# Patient Record
Sex: Female | Born: 1965
Health system: Southern US, Community
[De-identification: ages and names within clinical notes are randomized; demographics above are authoritative.]

## PROBLEM LIST (undated history)

## (undated) DIAGNOSIS — F329 Major depressive disorder, single episode, unspecified: Secondary | ICD-10-CM

## (undated) DIAGNOSIS — E039 Hypothyroidism, unspecified: Secondary | ICD-10-CM

## (undated) DIAGNOSIS — E079 Disorder of thyroid, unspecified: Secondary | ICD-10-CM

## (undated) DIAGNOSIS — F32A Depression, unspecified: Secondary | ICD-10-CM

## (undated) HISTORY — DX: Hypothyroidism, unspecified: E03.9

## (undated) HISTORY — PX: FRACTURE SURGERY: SHX138

## (undated) HISTORY — DX: Major depressive disorder, single episode, unspecified: F32.9

## (undated) HISTORY — DX: Depression, unspecified: F32.A

## (undated) HISTORY — PX: BREAST CYST EXCISION: SHX579

## (undated) HISTORY — DX: Disorder of thyroid, unspecified: E07.9

---

## 1988-09-19 HISTORY — PX: TUBAL LIGATION: SHX77

## 2001-09-19 HISTORY — PX: SPINAL CORD STIMULATOR IMPLANT: SHX2422

## 2011-08-15 ENCOUNTER — Ambulatory Visit: Payer: Self-pay | Admitting: Pain Medicine

## 2011-08-17 ENCOUNTER — Ambulatory Visit: Payer: Self-pay | Admitting: Pain Medicine

## 2011-08-23 ENCOUNTER — Ambulatory Visit: Payer: Self-pay | Admitting: Pain Medicine

## 2011-09-05 ENCOUNTER — Ambulatory Visit: Payer: Self-pay | Admitting: Pain Medicine

## 2011-10-03 ENCOUNTER — Ambulatory Visit: Payer: Self-pay | Admitting: Pain Medicine

## 2011-11-30 ENCOUNTER — Ambulatory Visit: Payer: Self-pay | Admitting: Pain Medicine

## 2011-11-30 LAB — HEMOGLOBIN: HGB: 12.5 g/dL (ref 12.0–16.0)

## 2011-12-06 ENCOUNTER — Ambulatory Visit: Payer: Self-pay | Admitting: Pain Medicine

## 2011-12-06 LAB — PREGNANCY, URINE: Pregnancy Test, Urine: NEGATIVE m[IU]/mL

## 2011-12-15 ENCOUNTER — Ambulatory Visit: Payer: Self-pay | Admitting: Pain Medicine

## 2012-01-23 ENCOUNTER — Ambulatory Visit: Payer: Self-pay | Admitting: Pain Medicine

## 2012-03-01 ENCOUNTER — Ambulatory Visit: Payer: Self-pay | Admitting: Internal Medicine

## 2012-05-29 ENCOUNTER — Ambulatory Visit: Payer: Self-pay | Admitting: Pain Medicine

## 2012-09-24 ENCOUNTER — Ambulatory Visit: Payer: Self-pay | Admitting: Pain Medicine

## 2013-01-21 ENCOUNTER — Other Ambulatory Visit: Payer: Self-pay | Admitting: Pain Medicine

## 2013-01-21 ENCOUNTER — Ambulatory Visit: Payer: Self-pay | Admitting: Pain Medicine

## 2013-05-24 ENCOUNTER — Ambulatory Visit: Payer: Self-pay | Admitting: Pain Medicine

## 2013-07-23 ENCOUNTER — Ambulatory Visit: Payer: Self-pay | Admitting: Pain Medicine

## 2013-11-08 ENCOUNTER — Other Ambulatory Visit: Payer: Self-pay | Admitting: Pain Medicine

## 2013-11-08 ENCOUNTER — Ambulatory Visit: Payer: Self-pay | Admitting: Pain Medicine

## 2013-11-08 LAB — HEPATIC FUNCTION PANEL A (ARMC)
ALBUMIN: 4.1 g/dL (ref 3.4–5.0)
ALK PHOS: 48 U/L
ALT: 19 U/L (ref 12–78)
AST: 24 U/L (ref 15–37)
BILIRUBIN TOTAL: 0.4 mg/dL (ref 0.2–1.0)
Bilirubin, Direct: 0.1 mg/dL (ref 0.00–0.20)
Total Protein: 7.4 g/dL (ref 6.4–8.2)

## 2013-11-08 LAB — BASIC METABOLIC PANEL
Anion Gap: 4 — ABNORMAL LOW (ref 7–16)
BUN: 6 mg/dL — ABNORMAL LOW (ref 7–18)
CREATININE: 0.93 mg/dL (ref 0.60–1.30)
Calcium, Total: 9 mg/dL (ref 8.5–10.1)
Chloride: 104 mmol/L (ref 98–107)
Co2: 30 mmol/L (ref 21–32)
EGFR (Non-African Amer.): >60
Glucose: 86 mg/dL (ref 65–99)
OSMOLALITY: 273 (ref 275–301)
Potassium: 3.7 mmol/L (ref 3.5–5.1)
SODIUM: 138 mmol/L (ref 136–145)

## 2013-11-08 LAB — MAGNESIUM: Magnesium: 1.8 mg/dL

## 2014-02-17 ENCOUNTER — Ambulatory Visit: Payer: Self-pay | Admitting: Pain Medicine

## 2014-05-23 ENCOUNTER — Ambulatory Visit: Payer: Self-pay | Admitting: Pain Medicine

## 2014-09-05 ENCOUNTER — Ambulatory Visit: Payer: Self-pay | Admitting: Pain Medicine

## 2015-01-11 NOTE — Op Note (Signed)
PATIENT NAME:  Dorise HissFAVORITE, MELISSA B MR#:  [id number] DATE OF BIRTH:  1966-09-18  DATE OF PROCEDURE:  12/06/2011  ADDENDUM: Following is the information with regards to the implanted equipment for the patient.  1. Medtronic Restore Ultra Model [id number]#37712, Serial [id number].  2. Medtronic 1x4 pocket adapter Model [id number]#74001, Lot [id number]. 3. Medtronic patient programmer Model [id number], Serial [id number]. 4. Medtronic charging system Model [id number]#37754, Serial [id number]. ____________________________ Sydnee LevansFrancisco A. Laban EmperorNaveira, MD fan:slb D: 12/07/2011 08:11:00 ET T: 12/07/2011 11:10:11 ET JOB#: [id number]  cc: Shetara Launer A. Laban EmperorNaveira, MD, <Dictator> Oswaldo DoneFRANCISCO A Merelin Human MD ELECTRONICALLY SIGNED 12/07/2011 15:34

## 2015-01-11 NOTE — Op Note (Signed)
PATIENT NAME:  Wendy Snyder, FULLMAN MR#:  [id number] DATE OF BIRTH:  [birth year] ACCOUNT NUMBER: [id number]   DATE OF PROCEDURE:  12/06/2011  Location: Operating Room Referring Physician: Bari Edward, M.D.  fax [phone] Consulting Pain Physician/Surgeon: Para March A. Laban Emperor, M.D.  Note:  This is the case of a 49 year old white female patient with a history significant for a right upper extremity complex regional pain syndrome who had a cervical spinal cord stimulator implanted approximately 10 years ago for the management of the condition. The patient has done extremely well and after ten years the original battery has depleted and the patient is coming in today to have it replaced. Previously we interrogated the battery device and confirmed that it was completely depleted.   Procedure(s):  1. Neurostimulator Generator Replacement (Battery change). 2. Intraoperative Analysis and Programming. 3. Postoperative Analysis and Programming. 4. General anesthesia by [employer] [facility]  Surgeon: Sydnee Levans. Laban Emperor, M.D. Side of implant: Right side Diagnostic Indications: Right upper extremity complex regional pain syndrome.  End of battery life. Position: Supine. Prepping solution: DuraPrep Area prepped:  Right lower abdominal quadrant where the prior battery was implanted.  Infection Control:  Standard Universal Precautions taken (Respiratory Hygiene/Cough Etiquette; Mouth, nose, eye protection; Hand Hygiene; Personal protective equipment (PPE); safe injection practices; and use of masks and disposable sterile surgical gloves) as recommended by the Department of [employer] Center for Disease Control and Prevention (CDC).  Safety Measures:  Allergies were reviewed. Appropriate site, procedure, and patient were confirmed by following the Joint Commission's Universal Protocol (UP.01.01.01). The patient was asked to confirm marked site and procedure, before commencing. The patient was  asked about blood thinners, or active infections, both of which were denied. No attempt was made at seeking any paresthesias. Aspiration looking for blood return was conducted prior to injecting. At no point did we inject any substances, as a needle was being advanced.  Pre-procedure Assessment:  A medical history and physical exam were obtained. Relevant documentation was reviewed and verified. Prior to the procedure, the patient was provided with an Audio CD, as well as written information on the procedure, including side-effects, and possible complications. Under the influence of no sedatives, a verbal, as well as a written informed consent were obtained, after having provided information on the risks and possible complications. To fulfill our ethical and legal obligations, as recommended by the American Medical Association's Code of Ethics, we have provided information to the patient about our clinical impression; the nature and purpose of an available treatment or procedure; the risks and benefits of an available treatment or procedure; alternatives; the risk and benefits of the alternative treatment or procedure; and the risks and benefits of not receiving or undergoing a treatment or procedure. The patient was provided information about the risks and possible complications associated with the procedure. These include, but not limited to, failure to achieve desired goals, infection, bleeding, organ or nerve damage, allergic reactions, paralysis, and death. In addition, the patient was informed that Medicine is not an exact science; therefore, there is also the possibility of unforeseen risks and possible complications that may result in a catastrophic outcome. The patient indicated having understood very clearly.  We have given the patient no guarantees and we have made no promises. Ample time was given to the patient to ask questions, all of which were answered, to the patient's satisfaction, before  proceeding. The patient understands that by signing our informed consent form, they understand and accept the risks and the  fact that it is impossible to predict all possible complications. Baseline vital signs were taken and the medical assessment was completed. Verification of the correct person, correct site (including marking of site), and correct procedure were performed and confirmed by the patient. Baseline vital signs were taken and the initial assessment was completed. Verification of the correct person, correct site (including marking of site), and correct procedure were performed and confirmed by the patient, in the form of a "Time Out".  Monitoring: The patient was monitored in the usual manner, using NIBPM, ECG, and pulse oximetry.  IV Access:  An IV access was obtained and secured.  Anesthesia:  General anesthesia plus local anesthesia for postoperative pain management.  Prophylactic Antibiotics: Cefazolin (1st generation cephalosporin) 1 gm IV 30 minutes prior to the procedure.   Local Anesthesia: Lidocaine 1%. The skin over the procedure site were infiltrated using a 3 ml Luer-Lok syringe with a 0.5 inch, 25-G needle. Deeper tissues were infiltrated using a 3.0 inch, 22-G spinal needle, under fluoroscopic guidance.  Fluoroscopy:  Fluoroscopy was not used for this particular case.  Description of the procedure:  Once the area had been prepped and draped it was then infiltrated using a combination of lidocaine and Sensorcaine. An incision was made and the prior generator was identified and removed by unscrewing the leads connected to the device. An adapter was then placed and connected to the new battery which was then placed inside of the established battery pocket. At this point an analysis of the device was conducted and we confirmed adequate connection with confirmed low impedance in all leads. The pocket was then cleaned with a combination of hydrogen peroxide and Betadine. The  pocket was then approximated using Vicryl and subsequently the skin closed with surgical staples. Betadine ointment was placed over the wound followed by a piece of Telfa. This was then covered with an OpSite. The patient was then given an abdominal binder. The patient tolerated the entire procedure well and after having emerged from general anesthesia the patient was transferred to the recovery room where further programming and analysis of the device was conducted.    EBL: 10 ml.  Complications: No heme; no paresthesias.  Disposition: Return to clinics in 10-11 days for removal of staples and postoperative evaluation.  Additional Comments/Plan: None.  Equipment used: see adendum.  Disclaimer: Medicine is not an Visual merchandiserexact science. The only guarantee in medicine is that nothing is guaranteed. It is important to note that the decision to proceed with this intervention was based on the information collected from the patient. The Data and conclusions were drawn from the patient's questionnaire, the interview, and the physical examination. Because the information was provided in large part by the patient, it cannot be guaranteed that it has not been purposely or unconsciously manipulated. Every effort has been made to obtain as much relevant data as possible for this evaluation. It is important to note that the conclusions that lead to this procedure are derived in large part from the available data. Always take into account that the treatment will also be dependent on availability of resources and existing treatment guidelines, considered by other Pain Management Practitioners as being common knowledge and practice, at this time. For Medico-Legal purposes, it is also important to point out that variations in procedural techniques and pharmacological choices are the acceptable norm. The indications, contraindications, technique, and results of the above procedure should only be interpreted and judged by a  Board-Certified Interventional Pain Specialist with extensive familiarity  and expertise in the same exact procedure and technique, doing otherwise would be inappropriate and unethical.   ____________________________ Sydnee Levans. Laban Emperor, MD fan:bjt D: 12/07/2011 08:07:00 ET T: 12/07/2011 11:11:52 ET JOB#: [id number]  cc: Broedy Osbourne A. Laban Emperor, MD, <Dictator> Bari Edward, MD Oswaldo Done MD ELECTRONICALLY SIGNED 12/07/2011 15:35

## 2015-03-03 ENCOUNTER — Other Ambulatory Visit: Payer: Self-pay | Admitting: Internal Medicine

## 2015-03-03 ENCOUNTER — Encounter: Payer: Self-pay | Admitting: Internal Medicine

## 2015-03-03 DIAGNOSIS — F064 Anxiety disorder due to known physiological condition: Secondary | ICD-10-CM | POA: Insufficient documentation

## 2015-03-03 DIAGNOSIS — E039 Hypothyroidism, unspecified: Secondary | ICD-10-CM | POA: Insufficient documentation

## 2015-03-03 DIAGNOSIS — B009 Herpesviral infection, unspecified: Secondary | ICD-10-CM | POA: Insufficient documentation

## 2015-03-03 DIAGNOSIS — G894 Chronic pain syndrome: Secondary | ICD-10-CM | POA: Insufficient documentation

## 2015-03-03 DIAGNOSIS — N6019 Diffuse cystic mastopathy of unspecified breast: Secondary | ICD-10-CM | POA: Insufficient documentation

## 2015-03-03 DIAGNOSIS — F324 Major depressive disorder, single episode, in partial remission: Secondary | ICD-10-CM | POA: Insufficient documentation

## 2015-03-03 DIAGNOSIS — F5101 Primary insomnia: Secondary | ICD-10-CM | POA: Insufficient documentation

## 2015-03-04 ENCOUNTER — Ambulatory Visit
Admission: EM | Admit: 2015-03-04 | Discharge: 2015-03-04 | Disposition: A | Discharge status: Home / Self Care | Payer: BLUE CROSS/BLUE SHIELD | Attending: Family Medicine | Admitting: Family Medicine

## 2015-03-04 DIAGNOSIS — J069 Acute upper respiratory infection, unspecified: Secondary | ICD-10-CM | POA: Insufficient documentation

## 2015-03-04 DIAGNOSIS — Z79899 Other long term (current) drug therapy: Secondary | ICD-10-CM | POA: Diagnosis not present

## 2015-03-04 DIAGNOSIS — J029 Acute pharyngitis, unspecified: Secondary | ICD-10-CM | POA: Diagnosis present

## 2015-03-04 DIAGNOSIS — L237 Allergic contact dermatitis due to plants, except food: Secondary | ICD-10-CM | POA: Insufficient documentation

## 2015-03-04 LAB — RAPID STREP SCREEN (MED CTR MEBANE ONLY): STREPTOCOCCUS, GROUP A SCREEN (DIRECT): NEGATIVE

## 2015-03-04 MED ORDER — TRIAMCINOLONE ACETONIDE 0.1 % EX CREA
1.0000 | TOPICAL_CREAM | Freq: Two times a day (BID) | CUTANEOUS | Status: DC
Start: 2015-03-04 — End: 2015-08-17

## 2015-03-04 MED ORDER — AZITHROMYCIN 250 MG PO TABS: ORAL_TABLET | ORAL | Status: DC

## 2015-03-04 NOTE — ED Notes (Signed)
Patient complains of sore throat. She states that symptoms started on Sunday. States it is painful to swallow. She denies known fevers.

## 2015-03-04 NOTE — ED Provider Notes (Signed)
CSN: [id number]     Arrival date & time 03/04/15  1537 History   First MD Initiated Contact with Patient 03/04/15 1622     Chief Complaint  Patient presents with  . Sore Throat   (Consider location/radiation/quality/duration/timing/severity/associated sxs/prior Treatment) HPI   49 year old female who presents with a sore throat and upper respiratory symptoms since Sunday 3 days ago. She states that she and her boyfriend were burning poison ivy and that evening began to have a sore throat. Monday she felt very fatigued and slept most of the day is able to return to work on Tuesday. She has not been running a fever but has had a slight cough which has been nonproductive although she feels as though she has something in the back of her throat that she should try to expectorate.  No past medical history on file. Past Surgical History  Procedure Laterality Date  . Spinal cord stimulator implant  2003  . Tubal ligation  1990   Family History  Problem Relation Age of Onset  . Depression Mother    History  Substance Use Topics  . Smoking status: Never Smoker   . Smokeless tobacco: Not on file  . Alcohol Use: No   OB History    No data available     Review of Systems  HENT: Positive for congestion, postnasal drip and sore throat.   Eyes: Negative for photophobia, pain, discharge, redness, itching and visual disturbance.  Respiratory: Positive for cough.   All other systems reviewed and are negative.   Allergies  Review of patient's allergies indicates no known allergies.  Home Medications   Prior to Admission medications   Medication Sig Start Date End Date Taking? Authorizing Provider  ALPRAZolam Prudy Feeler) 0.5 MG tablet Take 1 tablet by mouth 2 (two) times daily as needed. 08/31/14  Yes Historical Provider, MD  HYDROcodone-acetaminophen (NORCO) 10-325 MG per tablet Take 1 tablet by mouth 6 (six) times daily.   Yes Historical Provider, MD  levothyroxine (SYNTHROID, LEVOTHROID) 50  MCG tablet Take 50 mcg by mouth daily before breakfast.   Yes Historical Provider, MD  azithromycin (ZITHROMAX Z-PAK) 250 MG tablet As directed per package instructions 03/04/15   Lutricia Feil, PA-C  triamcinolone cream (KENALOG) 0.1 % Apply 1 application topically 2 (two) times daily. 03/04/15   Lutricia Feil, PA-C  zolpidem (AMBIEN) 5 MG tablet Take 1 tablet by mouth at bedtime. 03/19/14   Historical Provider, MD   BP 143/89 mmHg  Pulse 91  Temp(Src) 97.9 F (36.6 C) (Oral)  Resp 18  Ht 5\' 7"  (1.702 m)  Wt 135 lb (61.236 kg)  BMI 21.14 kg/m2  SpO2 100%  LMP 01/29/2015 Physical Exam  Constitutional: She is oriented to person, place, and time. She appears well-developed and well-nourished.  HENT:  Head: Normocephalic and atraumatic.  Eyes: EOM are normal. Pupils are equal, round, and reactive to light. Right eye exhibits no discharge. Left eye exhibits no discharge.  Neck: Neck supple.  Pulmonary/Chest: Effort normal and breath sounds normal. No stridor. No respiratory distress. She has no wheezes. She has no rales. She exhibits no tenderness.  Musculoskeletal: She exhibits no edema or tenderness.  Lymphadenopathy:    She has no cervical adenopathy.  Neurological: She is alert and oriented to person, place, and time. She has normal reflexes.  Skin: Skin is warm and dry.  Patient has several linear erythematous best vesicular type lesions on her arms from poison ivy  Psychiatric: She has a normal  mood and affect. Her behavior is normal. Judgment and thought content normal.    ED Course  Procedures (including critical care time) Labs Review Labs Reviewed  RAPID STREP SCREEN (NOT AT [facility])  CULTURE, GROUP A STREP ([facility] ONLY)    Imaging Review No results found.   MDM   1. URI (upper respiratory infection)   2. Poison ivy dermatitis    New Prescriptions   AZITHROMYCIN (ZITHROMAX Z-PAK) 250 MG TABLET    As directed per package instructions   TRIAMCINOLONE CREAM (KENALOG)  0.1 %    Apply 1 application topically 2 (two) times daily.   Plan: 1. Diagnosis reviewed with patient 2. rx as per orders; risks, benefits, potential side effects reviewed with patient 3. Recommend supportive treatment with Flonase. 4. F/u prn if symptoms worsen or don't improve Reviewed findings with the patient. I've told her that the strep will be cultured for subgroups of strep. He'll call in 48 hours for those findings. In addition I've given her some triamcinolone cream for the poison ivy on her arms. Very limited amount and should respond to a topical application. She is follow-up she will return to the care of Dr. Deatra Robinson, PA-C 03/04/15 1708

## 2015-03-04 NOTE — Discharge Instructions (Signed)
Upper Respiratory Infection, Adult An upper respiratory infection (URI) is also sometimes known as the common cold. The upper respiratory tract includes the nose, sinuses, throat, trachea, and bronchi. Bronchi are the airways leading to the lungs. Most people improve within 1 week, but symptoms can last up to 2 weeks. A residual cough may last even longer.  CAUSES Many different viruses can infect the tissues lining the upper respiratory tract. The tissues become irritated and inflamed and often become very moist. Mucus production is also common. A cold is contagious. You can easily spread the virus to others by oral contact. This includes kissing, sharing a glass, coughing, or sneezing. Touching your mouth or nose and then touching a surface, which is then touched by another person, can also spread the virus. SYMPTOMS  Symptoms typically develop 1 to 3 days after you come in contact with a cold virus. Symptoms vary from person to person. They may include:  Runny nose.  Sneezing.  Nasal congestion.  Sinus irritation.  Sore throat.  Loss of voice (laryngitis).  Cough.  Fatigue.  Muscle aches.  Loss of appetite.  Headache.  Low-grade fever. DIAGNOSIS  You might diagnose your own cold based on familiar symptoms, since most people get a cold 2 to 3 times a year. Your caregiver can confirm this based on your exam. Most importantly, your caregiver can check that your symptoms are not due to another disease such as strep throat, sinusitis, pneumonia, asthma, or epiglottitis. Blood tests, throat tests, and X-rays are not necessary to diagnose a common cold, but they may sometimes be helpful in excluding other more serious diseases. Your caregiver will decide if any further tests are required. RISKS AND COMPLICATIONS  You may be at risk for a more severe case of the common cold if you smoke cigarettes, have chronic heart disease (such as heart failure) or lung disease (such as asthma), or if  you have a weakened immune system. The very young and very old are also at risk for more serious infections. Bacterial sinusitis, middle ear infections, and bacterial pneumonia can complicate the common cold. The common cold can worsen asthma and chronic obstructive pulmonary disease (COPD). Sometimes, these complications can require emergency medical care and may be life-threatening. PREVENTION  The best way to protect against getting a cold is to practice good hygiene. Avoid oral or hand contact with people with cold symptoms. Wash your hands often if contact occurs. There is no clear evidence that vitamin C, vitamin E, echinacea, or exercise reduces the chance of developing a cold. However, it is always recommended to get plenty of rest and practice good nutrition. TREATMENT  Treatment is directed at relieving symptoms. There is no cure. Antibiotics are not effective, because the infection is caused by a virus, not by bacteria. Treatment may include:  Increased fluid intake. Sports drinks offer valuable electrolytes, sugars, and fluids.  Breathing heated mist or steam (vaporizer or shower).  Eating chicken soup or other clear broths, and maintaining good nutrition.  Getting plenty of rest.  Using gargles or lozenges for comfort.  Controlling fevers with ibuprofen or acetaminophen as directed by your caregiver.  Increasing usage of your inhaler if you have asthma. Zinc gel and zinc lozenges, taken in the first 24 hours of the common cold, can shorten the duration and lessen the severity of symptoms. Pain medicines may help with fever, muscle aches, and throat pain. A variety of non-prescription medicines are available to treat congestion and runny nose. Your caregiver   can make recommendations and may suggest nasal or lung inhalers for other symptoms.  HOME CARE INSTRUCTIONS   Only take over-the-counter or prescription medicines for pain, discomfort, or fever as directed by your  caregiver.  Use a warm mist humidifier or inhale steam from a shower to increase air moisture. This may keep secretions moist and make it easier to breathe.  Drink enough water and fluids to keep your urine clear or pale yellow.  Rest as needed.  Return to work when your temperature has returned to normal or as your caregiver advises. You may need to stay home longer to avoid infecting others. You can also use a face mask and careful hand washing to prevent spread of the virus. SEEK MEDICAL CARE IF:   After the first few days, you feel you are getting worse rather than better.  You need your caregiver's advice about medicines to control symptoms.  You develop chills, worsening shortness of breath, or brown or red sputum. These may be signs of pneumonia.  You develop yellow or brown nasal discharge or pain in the face, especially when you bend forward. These may be signs of sinusitis.  You develop a fever, swollen neck glands, pain with swallowing, or white areas in the back of your throat. These may be signs of strep throat. SEEK IMMEDIATE MEDICAL CARE IF:   You have a fever.  You develop severe or persistent headache, ear pain, sinus pain, or chest pain.  You develop wheezing, a prolonged cough, cough up blood, or have a change in your usual mucus (if you have chronic lung disease).  You develop sore muscles or a stiff neck. Document Released: 03/01/2001 Document Revised: 11/28/2011 Document Reviewed: 12/11/2013 Exit[employer] Patient Information 2015 [employer]. This information is not intended to replace advice given to you by your health care provider. Make sure you discuss any questions you have with your health care provider.  

## 2015-03-06 ENCOUNTER — Encounter: Payer: Self-pay | Admitting: Internal Medicine

## 2015-03-06 ENCOUNTER — Ambulatory Visit (INDEPENDENT_AMBULATORY_CARE_PROVIDER_SITE_OTHER): Payer: BLUE CROSS/BLUE SHIELD | Admitting: Internal Medicine

## 2015-03-06 ENCOUNTER — Other Ambulatory Visit: Payer: Self-pay | Admitting: Internal Medicine

## 2015-03-06 VITALS — BP 110/80 | HR 82 | Temp 98.2°F | Ht 67.0 in | Wt 140.2 lb

## 2015-03-06 DIAGNOSIS — J40 Bronchitis, not specified as acute or chronic: Secondary | ICD-10-CM

## 2015-03-06 MED ORDER — HYDROCODONE-HOMATROPINE 5-1.5 MG/5ML PO SYRP
5.0000 mL | ORAL_SOLUTION | Freq: Four times a day (QID) | ORAL | Status: DC | PRN
Start: 2015-03-06 — End: 2015-08-17

## 2015-03-06 MED ORDER — CEFDINIR 300 MG PO CAPS: 300.0000 mg | ORAL_CAPSULE | Freq: Two times a day (BID) | ORAL | Status: DC

## 2015-03-06 NOTE — Progress Notes (Signed)
Date:  03/06/2015   Name:  Wendy Snyder   DOB:  May 01, 1966   MRN:  [id number]   Chief Complaint: Sore Throat Sore Throat  This is a new problem. The current episode started in the past 7 days. The problem has been unchanged. There has been no fever. Associated symptoms include congestion, coughing, a hoarse voice and swollen glands. Pertinent negatives include no ear discharge, ear pain, headaches, shortness of breath or vomiting. She has had no exposure to strep or mono. Treatments tried: Seen in UC 2 days ago.  Strep and strep culture were negative.  Zpak prescribed. The treatment provided mild (using Nyquil.) relief.     Review of Systems:  Review of Systems  Constitutional: Negative for fever and fatigue.  HENT: Positive for congestion, hoarse voice, sore throat and voice change. Negative for ear discharge, ear pain, mouth sores and tinnitus.   Respiratory: Positive for cough and chest tightness. Negative for shortness of breath.   Cardiovascular: Negative for chest pain.  Gastrointestinal: Negative for vomiting.  Neurological: Negative for headaches.    Patient Active Problem List   Diagnosis Date Noted  . 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 Clinical depression 03/03/2015  . Chronic pain syndrome 03/03/2015  . Bloodgood disease 03/03/2015  . Idiopathic insomnia 03/03/2015    Prior to Admission medications   Medication Sig Start Date End Date Taking? Authorizing Provider  ALPRAZolam Prudy Feeler) 0.5 MG tablet Take 1 tablet by mouth 2 (two) times daily as needed. 08/31/14  Yes Historical Provider, MD  azithromycin (ZITHROMAX Z-PAK) 250 MG tablet As directed per package instructions 03/04/15  Yes Lutricia Feil, PA-C  HYDROcodone-acetaminophen (NORCO) 10-325 MG per tablet Take 1 tablet by mouth 6 (six) times daily.   Yes Historical Provider, MD  levothyroxine  (SYNTHROID, LEVOTHROID) 50 MCG tablet Take 50 mcg by mouth daily before breakfast.   Yes Historical Provider, MD  triamcinolone cream (KENALOG) 0.1 % Apply 1 application topically 2 (two) times daily. 03/04/15  Yes Lutricia Feil, PA-C  venlafaxine XR (EFFEXOR-XR) 75 MG 24 hr capsule Take 1 capsule by mouth daily. 02/06/15  Yes Historical Provider, MD  zolpidem (AMBIEN) 5 MG tablet Take 1 tablet by mouth at bedtime. 03/19/14   Historical Provider, MD    No Known Allergies  Past Surgical History  Procedure Laterality Date  . Spinal cord stimulator implant  2003  . Tubal ligation  1990    History  Substance Use Topics  . Smoking status: Never Smoker   . Smokeless tobacco: Not on file  . Alcohol Use: No     Medication list has been reviewed and updated.  Physical Examination:  Physical Exam  Constitutional: She appears well-developed and well-nourished.  HENT:  Right Ear: External ear normal.  Left Ear: External ear normal.  Mouth/Throat: No oropharyngeal exudate.  Neck: Normal range of motion. Neck supple.  Cardiovascular: Normal rate, regular rhythm and normal heart sounds.   Pulmonary/Chest: Effort normal. No respiratory distress. She has no wheezes. She has rhonchi in the right upper field and the left upper field.  Lymphadenopathy:    She has no cervical adenopathy.  Psychiatric: She has a normal mood and affect.    BP 110/80 mmHg  Pulse 82  Temp(Src) 98.2 F (36.8 C)  Ht 5\' 7"  (1.702 m)  Wt 140 lb 3.2 oz (63.594 kg)  BMI 21.95 kg/m2  SpO2 98%  LMP  01/29/2015  1. Bronchitis Increase fluid intake Begin Mucinex bid Stop Zpak - cefdinir (OMNICEF) 300 MG capsule; Take 1 capsule (300 mg total) by mouth 2 (two) times daily.  Dispense: 20 capsule; Refill: 0 - HYDROcodone-homatropine (HYCODAN) 5-1.5 MG/5ML syrup; Take 5 mLs by mouth every 6 (six) hours as needed for cough.  Dispense: 120 mL; Refill: 0   Bari Edward, MD [facility]  03/06/2015

## 2015-03-06 NOTE — Patient Instructions (Signed)
Take Mucinex (plain) twice a day for 5-7 days

## 2015-03-07 LAB — CULTURE, GROUP A STREP (THRC)

## 2015-03-20 ENCOUNTER — Other Ambulatory Visit: Payer: Self-pay | Admitting: Internal Medicine

## 2015-05-02 ENCOUNTER — Other Ambulatory Visit: Payer: Self-pay | Admitting: Internal Medicine

## 2015-05-15 ENCOUNTER — Other Ambulatory Visit: Payer: Self-pay | Admitting: Internal Medicine

## 2015-06-30 ENCOUNTER — Other Ambulatory Visit: Payer: Self-pay

## 2015-06-30 MED ORDER — ACYCLOVIR 5 % EX OINT: 1.0000 "application " | TOPICAL_OINTMENT | CUTANEOUS | Status: DC

## 2015-06-30 MED ORDER — VALACYCLOVIR HCL 1 G PO TABS: 1000.0000 mg | ORAL_TABLET | Freq: Two times a day (BID) | ORAL | Status: DC

## 2015-07-16 ENCOUNTER — Ambulatory Visit: Payer: BLUE CROSS/BLUE SHIELD | Admitting: Pain Medicine

## 2015-07-16 ENCOUNTER — Other Ambulatory Visit: Payer: Self-pay | Admitting: Internal Medicine

## 2015-08-17 ENCOUNTER — Ambulatory Visit: Payer: BLUE CROSS/BLUE SHIELD | Attending: Pain Medicine | Admitting: Pain Medicine

## 2015-08-17 ENCOUNTER — Other Ambulatory Visit: Payer: Self-pay | Admitting: Pain Medicine

## 2015-08-17 VITALS — BP 143/88 | HR 68 | Temp 98.3°F | Resp 18 | Ht 67.0 in | Wt 140.0 lb

## 2015-08-17 DIAGNOSIS — Z9689 Presence of other specified functional implants: Secondary | ICD-10-CM

## 2015-08-17 DIAGNOSIS — M549 Dorsalgia, unspecified: Secondary | ICD-10-CM | POA: Insufficient documentation

## 2015-08-17 DIAGNOSIS — Z79899 Other long term (current) drug therapy: Secondary | ICD-10-CM

## 2015-08-17 DIAGNOSIS — G894 Chronic pain syndrome: Secondary | ICD-10-CM | POA: Diagnosis not present

## 2015-08-17 DIAGNOSIS — M79601 Pain in right arm: Secondary | ICD-10-CM

## 2015-08-17 DIAGNOSIS — Z79891 Long term (current) use of opiate analgesic: Secondary | ICD-10-CM | POA: Insufficient documentation

## 2015-08-17 DIAGNOSIS — F1190 Opioid use, unspecified, uncomplicated: Secondary | ICD-10-CM | POA: Diagnosis not present

## 2015-08-17 DIAGNOSIS — Z5181 Encounter for therapeutic drug level monitoring: Secondary | ICD-10-CM | POA: Insufficient documentation

## 2015-08-17 DIAGNOSIS — Z9149 Other personal history of psychological trauma, not elsewhere classified: Secondary | ICD-10-CM

## 2015-08-17 DIAGNOSIS — Z969 Presence of functional implant, unspecified: Secondary | ICD-10-CM

## 2015-08-17 DIAGNOSIS — G9050 Complex regional pain syndrome I, unspecified: Secondary | ICD-10-CM

## 2015-08-17 DIAGNOSIS — F1120 Opioid dependence, uncomplicated: Secondary | ICD-10-CM | POA: Insufficient documentation

## 2015-08-17 DIAGNOSIS — G90511 Complex regional pain syndrome I of right upper limb: Secondary | ICD-10-CM | POA: Diagnosis not present

## 2015-08-17 DIAGNOSIS — F419 Anxiety disorder, unspecified: Secondary | ICD-10-CM | POA: Diagnosis not present

## 2015-08-17 DIAGNOSIS — Z7189 Other specified counseling: Secondary | ICD-10-CM

## 2015-08-17 DIAGNOSIS — G8929 Other chronic pain: Secondary | ICD-10-CM

## 2015-08-17 DIAGNOSIS — Z8659 Personal history of other mental and behavioral disorders: Secondary | ICD-10-CM | POA: Insufficient documentation

## 2015-08-17 DIAGNOSIS — IMO0002 Reserved for concepts with insufficient information to code with codable children: Secondary | ICD-10-CM

## 2015-08-17 DIAGNOSIS — G90519 Complex regional pain syndrome I of unspecified upper limb: Secondary | ICD-10-CM | POA: Insufficient documentation

## 2015-08-17 MED ORDER — HYDROCODONE-ACETAMINOPHEN 10-325 MG PO TABS: 1.0000 | ORAL_TABLET | ORAL | Status: DC | PRN

## 2015-08-17 NOTE — Progress Notes (Signed)
Did not bring meds for pill count.  Reminded to bring to all appointments.
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
## 2015-08-22 LAB — TOXASSURE SELECT 13 (MW), URINE: PDF: 0

## 2015-10-01 ENCOUNTER — Other Ambulatory Visit: Payer: Self-pay | Admitting: Internal Medicine

## 2015-10-12 NOTE — Telephone Encounter (Signed)
pts coming in on 4/20 for cpe

## 2015-11-16 ENCOUNTER — Ambulatory Visit: Payer: Self-pay | Attending: Pain Medicine | Admitting: Pain Medicine

## 2015-11-16 ENCOUNTER — Encounter: Payer: Self-pay | Admitting: Pain Medicine

## 2015-11-16 VITALS — BP 133/84 | HR 83 | Temp 98.5°F | Resp 17 | Ht 68.0 in | Wt 130.0 lb

## 2015-11-16 DIAGNOSIS — G479 Sleep disorder, unspecified: Secondary | ICD-10-CM | POA: Insufficient documentation

## 2015-11-16 DIAGNOSIS — F418 Other specified anxiety disorders: Secondary | ICD-10-CM | POA: Insufficient documentation

## 2015-11-16 DIAGNOSIS — F5101 Primary insomnia: Secondary | ICD-10-CM | POA: Insufficient documentation

## 2015-11-16 DIAGNOSIS — M25551 Pain in right hip: Secondary | ICD-10-CM | POA: Insufficient documentation

## 2015-11-16 DIAGNOSIS — B0089 Other herpesviral infection: Secondary | ICD-10-CM | POA: Insufficient documentation

## 2015-11-16 DIAGNOSIS — Z9689 Presence of other specified functional implants: Secondary | ICD-10-CM | POA: Insufficient documentation

## 2015-11-16 DIAGNOSIS — M25531 Pain in right wrist: Secondary | ICD-10-CM | POA: Insufficient documentation

## 2015-11-16 DIAGNOSIS — Z79891 Long term (current) use of opiate analgesic: Secondary | ICD-10-CM | POA: Insufficient documentation

## 2015-11-16 DIAGNOSIS — G90511 Complex regional pain syndrome I of right upper limb: Secondary | ICD-10-CM | POA: Insufficient documentation

## 2015-11-16 DIAGNOSIS — F410 Panic disorder [episodic paroxysmal anxiety] without agoraphobia: Secondary | ICD-10-CM | POA: Insufficient documentation

## 2015-11-16 DIAGNOSIS — M545 Low back pain, unspecified: Secondary | ICD-10-CM | POA: Insufficient documentation

## 2015-11-16 DIAGNOSIS — Z5181 Encounter for therapeutic drug level monitoring: Secondary | ICD-10-CM

## 2015-11-16 DIAGNOSIS — G8929 Other chronic pain: Secondary | ICD-10-CM | POA: Insufficient documentation

## 2015-11-16 LAB — COMPREHENSIVE METABOLIC PANEL
ALBUMIN: 4.6 g/dL (ref 3.5–5.0)
ALT: 20 U/L (ref 14–54)
AST: 24 U/L (ref 15–41)
Alkaline Phosphatase: 41 U/L (ref 38–126)
Anion gap: 5 (ref 5–15)
BUN: 18 mg/dL (ref 6–20)
CO2: 27 mmol/L (ref 22–32)
CREATININE: 0.77 mg/dL (ref 0.44–1.00)
Calcium: 9.4 mg/dL (ref 8.9–10.3)
Chloride: 108 mmol/L (ref 101–111)
GFR calc Af Amer: >60 mL/min (ref >60)
GFR calc non Af Amer: >60 mL/min (ref >60)
Glucose, Bld: 67 mg/dL (ref 65–99)
Potassium: 4.5 mmol/L (ref 3.5–5.1)
Sodium: 140 mmol/L (ref 135–145)
Total Bilirubin: 0.9 mg/dL (ref 0.3–1.2)
Total Protein: 7 g/dL (ref 6.5–8.1)

## 2015-11-16 LAB — MAGNESIUM: Magnesium: 2.1 mg/dL (ref 1.7–2.4)

## 2015-11-16 LAB — SEDIMENTATION RATE: Sed Rate: 4 mm/hr (ref 0–20)

## 2015-11-16 LAB — C-REACTIVE PROTEIN: CRP: 0.6 mg/dL (ref <1.0)

## 2015-11-16 MED ORDER — HYDROCODONE-ACETAMINOPHEN 10-325 MG PO TABS: 1.0000 | ORAL_TABLET | ORAL | Status: DC | PRN

## 2015-11-16 NOTE — Progress Notes (Signed)
Safety precautions to be maintained throughout the outpatient stay will include: orient to surroundings, keep bed in low position, maintain call bell within reach at all times, provide assistance with transfer out of bed and ambulation. Hydrocodone #6/180  Filled 10/16/16
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
## 2015-11-21 LAB — TOXASSURE SELECT 13 (MW), URINE: PDF: 0

## 2016-01-07 ENCOUNTER — Encounter: Payer: Self-pay | Admitting: Internal Medicine

## 2016-01-07 ENCOUNTER — Ambulatory Visit (INDEPENDENT_AMBULATORY_CARE_PROVIDER_SITE_OTHER): Payer: Self-pay | Admitting: Internal Medicine

## 2016-01-07 VITALS — BP 124/88 | HR 80 | Ht 68.0 in | Wt 144.0 lb

## 2016-01-07 DIAGNOSIS — E034 Atrophy of thyroid (acquired): Secondary | ICD-10-CM

## 2016-01-07 DIAGNOSIS — F3341 Major depressive disorder, recurrent, in partial remission: Secondary | ICD-10-CM

## 2016-01-07 DIAGNOSIS — E038 Other specified hypothyroidism: Secondary | ICD-10-CM

## 2016-01-07 DIAGNOSIS — J018 Other acute sinusitis: Secondary | ICD-10-CM

## 2016-01-07 DIAGNOSIS — F064 Anxiety disorder due to known physiological condition: Secondary | ICD-10-CM

## 2016-01-07 MED ORDER — LEVOTHYROXINE SODIUM 50 MCG PO TABS: 50.0000 ug | ORAL_TABLET | Freq: Every day | ORAL | Status: DC

## 2016-01-07 MED ORDER — CEFDINIR 300 MG PO CAPS: 300.0000 mg | ORAL_CAPSULE | Freq: Two times a day (BID) | ORAL | Status: DC
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
## 2016-01-07 NOTE — Patient Instructions (Signed)
Breast Self-Awareness Practicing breast self-awareness may pick up problems early, prevent significant medical complications, and possibly save your life. By practicing breast self-awareness, you can become familiar with how your breasts look and feel and if your breasts are changing. This allows you to notice changes early. It can also offer you some reassurance that your breast health is good. One way to learn what is normal for your breasts and whether your breasts are changing is to do a breast self-exam. If you find a lump or something that was not present in the past, it is best to contact your caregiver right away. Other findings that should be evaluated by your caregiver include nipple discharge, especially if it is bloody; skin changes or reddening; areas where the skin seems to be pulled in (retracted); or new lumps and bumps. Breast pain is seldom associated with cancer (malignancy), but should also be evaluated by a caregiver. HOW TO PERFORM A BREAST SELF-EXAM The best time to examine your breasts is 5-7 days after your menstrual period is over. During menstruation, the breasts are lumpier, and it may be more difficult to pick up changes. If you do not menstruate, have reached menopause, or had your uterus removed (hysterectomy), you should examine your breasts at regular intervals, such as monthly. If you are breastfeeding, examine your breasts after a feeding or after using a breast pump. Breast implants do not decrease the risk for lumps or tumors, so continue to perform breast self-exams as recommended. Talk to your caregiver about how to determine the difference between the implant and breast tissue. Also, talk about the amount of pressure you should use during the exam. Over time, you will become more familiar with the variations of your breasts and more comfortable with the exam. A breast self-exam requires you to remove all your clothes above the waist. 1. Look at your breasts and nipples.  Stand in front of a mirror in a room with good lighting. With your hands on your hips, push your hands firmly downward. Look for a difference in shape, contour, and size from one breast to the other (asymmetry). Asymmetry includes puckers, dips, or bumps. Also, look for skin changes, such as reddened or scaly areas on the breasts. Look for nipple changes, such as discharge, dimpling, repositioning, or redness. 2. Carefully feel your breasts. This is best done either in the shower or tub while using soapy water or when flat on your back. Place the arm (on the side of the breast you are examining) above your head. Use the pads (not the fingertips) of your three middle fingers on your opposite hand to feel your breasts. Start in the underarm area and use  inch (2 cm) overlapping circles to feel your breast. Use 3 different levels of pressure (light, medium, and firm pressure) at each circle before moving to the next circle. The light pressure is needed to feel the tissue closest to the skin. The medium pressure will help to feel breast tissue a little deeper, while the firm pressure is needed to feel the tissue close to the ribs. Continue the overlapping circles, moving downward over the breast until you feel your ribs below your breast. Then, move one finger-width towards the center of the body. Continue to use the  inch (2 cm) overlapping circles to feel your breast as you move slowly up toward the collar bone (clavicle) near the base of the neck. Continue the up and down exam using all 3 pressures until you reach the   middle of the chest. Do this with each breast, carefully feeling for lumps or changes. 3.  Keep a written record with breast changes or normal findings for each breast. By writing this information down, you do not need to depend only on memory for size, tenderness, or location. Write down where you are in your menstrual cycle, if you are still menstruating. Breast tissue can have some lumps or  thick tissue. However, see your caregiver if you find anything that concerns you.  SEEK MEDICAL CARE IF:  You see a change in shape, contour, or size of your breasts or nipples.   You see skin changes, such as reddened or scaly areas on the breasts or nipples.   You have an unusual discharge from your nipples.   You feel a new lump or unusually thick areas.    This information is not intended to replace advice given to you by your health care provider. Make sure you discuss any questions you have with your health care provider.   Document Released: 09/05/2005 Document Revised: 08/22/2012 Document Reviewed: 12/21/2011 Elsevier Interactive Patient Education 2016 [employer]  

## 2016-01-13 ENCOUNTER — Other Ambulatory Visit: Payer: Self-pay | Admitting: Internal Medicine

## 2016-01-24 ENCOUNTER — Other Ambulatory Visit: Payer: Self-pay | Admitting: Internal Medicine

## 2016-01-27 ENCOUNTER — Ambulatory Visit: Payer: Self-pay | Attending: Oncology

## 2016-02-10 ENCOUNTER — Encounter: Payer: Self-pay | Admitting: Pain Medicine

## 2016-02-10 ENCOUNTER — Ambulatory Visit: Payer: Self-pay | Attending: Pain Medicine | Admitting: Pain Medicine

## 2016-02-10 VITALS — BP 130/88 | HR 80 | Temp 98.1°F | Resp 14 | Ht 68.0 in | Wt 140.0 lb

## 2016-02-10 DIAGNOSIS — Z79891 Long term (current) use of opiate analgesic: Secondary | ICD-10-CM

## 2016-02-10 DIAGNOSIS — F1190 Opioid use, unspecified, uncomplicated: Secondary | ICD-10-CM

## 2016-02-10 DIAGNOSIS — M25551 Pain in right hip: Secondary | ICD-10-CM

## 2016-02-10 DIAGNOSIS — Z5181 Encounter for therapeutic drug level monitoring: Secondary | ICD-10-CM

## 2016-02-10 DIAGNOSIS — Z969 Presence of functional implant, unspecified: Secondary | ICD-10-CM

## 2016-02-10 DIAGNOSIS — Z6821 Body mass index (BMI) 21.0-21.9, adult: Secondary | ICD-10-CM | POA: Insufficient documentation

## 2016-02-10 DIAGNOSIS — Z9689 Presence of other specified functional implants: Secondary | ICD-10-CM | POA: Insufficient documentation

## 2016-02-10 DIAGNOSIS — M545 Low back pain, unspecified: Secondary | ICD-10-CM

## 2016-02-10 DIAGNOSIS — G90511 Complex regional pain syndrome I of right upper limb: Secondary | ICD-10-CM

## 2016-02-10 DIAGNOSIS — G8929 Other chronic pain: Secondary | ICD-10-CM

## 2016-02-10 DIAGNOSIS — E039 Hypothyroidism, unspecified: Secondary | ICD-10-CM | POA: Insufficient documentation

## 2016-02-10 DIAGNOSIS — B009 Herpesviral infection, unspecified: Secondary | ICD-10-CM | POA: Insufficient documentation

## 2016-02-10 DIAGNOSIS — G9050 Complex regional pain syndrome I, unspecified: Secondary | ICD-10-CM

## 2016-02-10 DIAGNOSIS — M542 Cervicalgia: Secondary | ICD-10-CM | POA: Insufficient documentation

## 2016-02-10 DIAGNOSIS — F419 Anxiety disorder, unspecified: Secondary | ICD-10-CM | POA: Insufficient documentation

## 2016-02-10 DIAGNOSIS — F5101 Primary insomnia: Secondary | ICD-10-CM | POA: Insufficient documentation

## 2016-02-10 MED ORDER — HYDROCODONE-ACETAMINOPHEN 10-325 MG PO TABS: 1.0000 | ORAL_TABLET | ORAL | Status: DC | PRN

## 2016-02-10 MED ORDER — NALOXONE HCL 4 MG/0.1ML NA LIQD: NASAL | Status: DC

## 2016-02-10 NOTE — Progress Notes (Signed)
Hydrocodone (did not bring bottle; states about 12-15 pills left)

## 2016-02-10 NOTE — Patient Instructions (Addendum)
Facet Blocks Patient Information  Description: The facets are joints in the spine between the vertebrae.  Like any joints in the body, facets can become irritated and painful.  Arthritis can also effect the facets.  By injecting steroids and local anesthetic in and around these joints, we can temporarily block the nerve supply to them.  Steroids act directly on irritated nerves and tissues to reduce selling and inflammation which often leads to decreased pain.  Facet blocks may be done anywhere along the spine from the neck to the low back depending upon the location of your pain.   After numbing the skin with local anesthetic (like Novocaine), a small needle is passed onto the facet joints under x-ray guidance.  You may experience a sensation of pressure while this is being done.  The entire block usually lasts about 15-25 minutes.   Conditions which may be treated by facet blocks:   Low back/buttock pain  Neck/shoulder pain  Certain types of headaches  Preparation for the injection:  1. Do not eat any solid food or dairy products within 8 hours of your appointment. 2. You may drink clear liquid up to 3 hours before appointment.  Clear liquids include water, black coffee, juice or soda.  No milk or cream please. 3. You may take your regular medication, including pain medications, with a sip of water before your appointment.  Diabetics should hold regular insulin (if taken separately) and take 1/2 normal NPH dose the morning of the procedure.  Carry some sugar containing items with you to your appointment. 4. A driver must accompany you and be prepared to drive you home after your procedure. 5. Bring all your current medications with you. 6. An IV may be inserted and sedation may be given at the discretion of the physician. 7. A blood pressure cuff, EKG and other monitors will often be applied during the procedure.  Some patients may need to have extra oxygen administered for a short  period. 8. You will be asked to provide medical information, including your allergies and medications, prior to the procedure.  We must know immediately if you are taking blood thinners (like Coumadin/Warfarin) or if you are allergic to IV iodine contrast (dye).  We must know if you could possible be pregnant.  Possible side-effects:   Bleeding from needle site  Infection (rare, may require surgery)  Nerve injury (rare)  Numbness & tingling (temporary)  Difficulty urinating (rare, temporary)  Spinal headache (a headache worse with upright posture)  Light-headedness (temporary)  Pain at injection site (serveral days)  Decreased blood pressure (rare, temporary)  Weakness in arm/leg (temporary)  Pressure sensation in back/neck (temporary)   Call if you experience:   Fever/chills associated with headache or increased back/neck pain  Headache worsened by an upright position  New onset, weakness or numbness of an extremity below the injection site  Hives or difficulty breathing (go to the emergency room)  Inflammation or drainage at the injection site(s)  Severe back/neck pain greater than usual  New symptoms which are concerning to you  Please note:  Although the local anesthetic injected can often make your back or neck feel good for several hours after the injection, the pain will likely return. It takes 3-7 days for steroids to work.  You may not notice any pain relief for at least one week.  If effective, we will often do a series of 2-3 injections spaced 3-6 weeks apart to maximally decrease your pain.  After the initial   series, you may be a candidate for a more permanent nerve block of the facets.  If you have any questions, please call #[phone] [facility]  Patient will call when ready for procedure Prescriptions given for 3 months.
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
## 2016-02-18 LAB — TOXASSURE SELECT 13 (MW), URINE

## 2016-02-22 ENCOUNTER — Other Ambulatory Visit: Payer: Self-pay | Admitting: Internal Medicine
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
## 2016-04-27 ENCOUNTER — Ambulatory Visit: Payer: Self-pay | Attending: Pain Medicine | Admitting: Pain Medicine

## 2016-04-27 ENCOUNTER — Encounter: Payer: Self-pay | Admitting: Pain Medicine

## 2016-04-27 VITALS — BP 123/75 | HR 91 | Temp 97.8°F | Resp 18 | Ht 67.0 in | Wt 140.0 lb

## 2016-04-27 DIAGNOSIS — G4700 Insomnia, unspecified: Secondary | ICD-10-CM | POA: Insufficient documentation

## 2016-04-27 DIAGNOSIS — M545 Low back pain, unspecified: Secondary | ICD-10-CM

## 2016-04-27 DIAGNOSIS — E034 Atrophy of thyroid (acquired): Secondary | ICD-10-CM | POA: Insufficient documentation

## 2016-04-27 DIAGNOSIS — F1190 Opioid use, unspecified, uncomplicated: Secondary | ICD-10-CM

## 2016-04-27 DIAGNOSIS — B0089 Other herpesviral infection: Secondary | ICD-10-CM | POA: Insufficient documentation

## 2016-04-27 DIAGNOSIS — G479 Sleep disorder, unspecified: Secondary | ICD-10-CM | POA: Insufficient documentation

## 2016-04-27 DIAGNOSIS — F410 Panic disorder [episodic paroxysmal anxiety] without agoraphobia: Secondary | ICD-10-CM | POA: Insufficient documentation

## 2016-04-27 DIAGNOSIS — Z7189 Other specified counseling: Secondary | ICD-10-CM

## 2016-04-27 DIAGNOSIS — Z79891 Long term (current) use of opiate analgesic: Secondary | ICD-10-CM | POA: Insufficient documentation

## 2016-04-27 DIAGNOSIS — F5101 Primary insomnia: Secondary | ICD-10-CM | POA: Insufficient documentation

## 2016-04-27 DIAGNOSIS — M25551 Pain in right hip: Secondary | ICD-10-CM | POA: Insufficient documentation

## 2016-04-27 DIAGNOSIS — F3289 Other specified depressive episodes: Secondary | ICD-10-CM | POA: Insufficient documentation

## 2016-04-27 DIAGNOSIS — F419 Anxiety disorder, unspecified: Secondary | ICD-10-CM | POA: Insufficient documentation

## 2016-04-27 DIAGNOSIS — G90511 Complex regional pain syndrome I of right upper limb: Secondary | ICD-10-CM | POA: Insufficient documentation

## 2016-04-27 DIAGNOSIS — N6019 Diffuse cystic mastopathy of unspecified breast: Secondary | ICD-10-CM | POA: Insufficient documentation

## 2016-04-27 DIAGNOSIS — M25531 Pain in right wrist: Secondary | ICD-10-CM | POA: Insufficient documentation

## 2016-04-27 DIAGNOSIS — G8929 Other chronic pain: Secondary | ICD-10-CM | POA: Insufficient documentation

## 2016-04-27 DIAGNOSIS — E039 Hypothyroidism, unspecified: Secondary | ICD-10-CM | POA: Insufficient documentation

## 2016-04-27 DIAGNOSIS — Z9689 Presence of other specified functional implants: Secondary | ICD-10-CM | POA: Insufficient documentation

## 2016-04-27 MED ORDER — HYDROCODONE-ACETAMINOPHEN 10-325 MG PO TABS: 1.0000 | ORAL_TABLET | ORAL | 0 refills | Status: DC | PRN

## 2016-04-27 NOTE — Progress Notes (Signed)
Safety precautions to be maintained throughout the outpatient stay will include: orient to surroundings, keep bed in low position, maintain call bell within reach at all times, provide assistance with transfer out of bed and ambulation.  Bottle labeled Hydrocodone/acetamin 10/325 mg # 121/180  Filled 04-17-16

## 2016-06-04 ENCOUNTER — Other Ambulatory Visit: Payer: Self-pay | Admitting: Internal Medicine

## 2016-07-14 ENCOUNTER — Ambulatory Visit: Payer: 59 | Attending: Pain Medicine | Admitting: Pain Medicine

## 2016-07-14 ENCOUNTER — Encounter: Payer: Self-pay | Admitting: Pain Medicine

## 2016-07-14 DIAGNOSIS — G894 Chronic pain syndrome: Secondary | ICD-10-CM

## 2016-07-14 DIAGNOSIS — M545 Low back pain: Secondary | ICD-10-CM | POA: Insufficient documentation

## 2016-07-14 DIAGNOSIS — F329 Major depressive disorder, single episode, unspecified: Secondary | ICD-10-CM | POA: Diagnosis not present

## 2016-07-14 DIAGNOSIS — G9050 Complex regional pain syndrome I, unspecified: Secondary | ICD-10-CM | POA: Insufficient documentation

## 2016-07-14 DIAGNOSIS — E034 Atrophy of thyroid (acquired): Secondary | ICD-10-CM | POA: Diagnosis not present

## 2016-07-14 DIAGNOSIS — Z79891 Long term (current) use of opiate analgesic: Secondary | ICD-10-CM | POA: Diagnosis not present

## 2016-07-14 MED ORDER — HYDROCODONE-ACETAMINOPHEN 10-325 MG PO TABS: 1.0000 | ORAL_TABLET | ORAL | 0 refills | Status: DC | PRN
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
## 2016-07-14 NOTE — Progress Notes (Signed)
Nursing Pain Medication Assessment:  Safety precautions to be maintained throughout the outpatient stay will include: orient to surroundings, keep bed in low position, maintain call bell within reach at all times, provide assistance with transfer out of bed and ambulation.  Medication Inspection Compliance: [name] did not comply with our request to bring her pills to be counted. She was reminded that bringing the medication bottles, even when empty, is a requirement. Pill Count: No pills available to be counted today. Bottle Appearance: No container available. Did not bring bottle(s) to appointment. Medication: See above Filled Date: N/A Medication last intake: 07/13/2016 at noon

## 2016-07-26 ENCOUNTER — Other Ambulatory Visit: Payer: Self-pay | Admitting: Internal Medicine

## 2016-07-27 ENCOUNTER — Encounter: Payer: Self-pay | Admitting: Pain Medicine

## 2016-09-03 ENCOUNTER — Other Ambulatory Visit: Payer: Self-pay | Admitting: Internal Medicine

## 2016-09-11 ENCOUNTER — Other Ambulatory Visit: Payer: Self-pay | Admitting: Internal Medicine

## 2016-09-20 ENCOUNTER — Ambulatory Visit (INDEPENDENT_AMBULATORY_CARE_PROVIDER_SITE_OTHER): Payer: 59 | Admitting: Internal Medicine

## 2016-09-20 ENCOUNTER — Encounter: Payer: Self-pay | Admitting: Internal Medicine

## 2016-09-20 VITALS — BP 148/98 | HR 101 | Temp 98.2°F | Ht 67.0 in | Wt 146.0 lb

## 2016-09-20 DIAGNOSIS — J029 Acute pharyngitis, unspecified: Secondary | ICD-10-CM | POA: Diagnosis not present

## 2016-09-20 MED ORDER — PREDNISONE 10 MG PO TABS: ORAL_TABLET | ORAL | 0 refills | Status: DC

## 2016-09-20 MED ORDER — AMOXICILLIN-POT CLAVULANATE 875-125 MG PO TABS: 1.0000 | ORAL_TABLET | Freq: Two times a day (BID) | ORAL | 0 refills | Status: DC
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
## 2016-10-20 ENCOUNTER — Ambulatory Visit: Payer: 59 | Attending: Pain Medicine | Admitting: Pain Medicine

## 2016-10-20 ENCOUNTER — Encounter: Payer: Self-pay | Admitting: Pain Medicine

## 2016-10-20 VITALS — BP 127/91 | HR 80 | Temp 98.1°F | Resp 16 | Ht 67.0 in | Wt 135.0 lb

## 2016-10-20 DIAGNOSIS — Z79891 Long term (current) use of opiate analgesic: Secondary | ICD-10-CM | POA: Insufficient documentation

## 2016-10-20 DIAGNOSIS — G894 Chronic pain syndrome: Secondary | ICD-10-CM | POA: Diagnosis not present

## 2016-10-20 DIAGNOSIS — F410 Panic disorder [episodic paroxysmal anxiety] without agoraphobia: Secondary | ICD-10-CM | POA: Insufficient documentation

## 2016-10-20 DIAGNOSIS — G9050 Complex regional pain syndrome I, unspecified: Secondary | ICD-10-CM | POA: Diagnosis not present

## 2016-10-20 DIAGNOSIS — M79601 Pain in right arm: Secondary | ICD-10-CM | POA: Insufficient documentation

## 2016-10-20 DIAGNOSIS — G8929 Other chronic pain: Secondary | ICD-10-CM | POA: Diagnosis not present

## 2016-10-20 DIAGNOSIS — M5441 Lumbago with sciatica, right side: Secondary | ICD-10-CM | POA: Insufficient documentation

## 2016-10-20 DIAGNOSIS — M545 Low back pain, unspecified: Secondary | ICD-10-CM

## 2016-10-20 DIAGNOSIS — F1190 Opioid use, unspecified, uncomplicated: Secondary | ICD-10-CM

## 2016-10-20 MED ORDER — HYDROCODONE-ACETAMINOPHEN 10-325 MG PO TABS: 1.0000 | ORAL_TABLET | ORAL | 0 refills | Status: DC | PRN

## 2016-10-20 NOTE — Progress Notes (Signed)
Nursing Pain Medication Assessment:  Safety precautions to be maintained throughout the outpatient stay will include: orient to surroundings, keep bed in low position, maintain call bell within reach at all times, provide assistance with transfer out of bed and ambulation.  Medication Inspection Compliance: [name] did not comply with our request to bring her pills to be counted. She was reminded that bringing the medication bottles, even when empty, is a requirement. Pill/Patch Count: None available to be counted. Bottle Appearance: No container available. Did not bring bottle(s) to appointment. Medication: None brought in. Filled Date: N/A Last Medication intake:  Today 0800

## 2016-10-20 NOTE — Progress Notes (Signed)
Nursing Pain Medication Assessment:  Safety precautions to be maintained throughout the outpatient stay will include: orient to surroundings, keep bed in low position, maintain call bell within reach at all times, provide assistance with transfer out of bed and ambulation.  Medication Inspection Compliance: Pill count conducted under aseptic conditions, in front of the patient. Neither the pills nor the bottle was removed from the patient's sight at any time. Once count was completed pills were immediately returned to the patient in their original bottle.  Medication: See above Pill/Patch Count: 177 of 180 pills remain Bottle Appearance: Standard pharmacy container. Clearly labeled. Filled Date: 01 / 31 / 2018 Last Medication intake:  Today
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
## 2016-10-26 ENCOUNTER — Other Ambulatory Visit: Payer: Self-pay | Admitting: Internal Medicine

## 2016-10-26 LAB — TOXASSURE SELECT 13 (MW), URINE

## 2016-10-27 ENCOUNTER — Other Ambulatory Visit: Payer: Self-pay | Admitting: Internal Medicine

## 2016-11-23 ENCOUNTER — Telehealth: Payer: Self-pay

## 2016-11-23 NOTE — Telephone Encounter (Signed)
Pt says she needs a prior auth for Norco.

## 2016-11-23 NOTE — Telephone Encounter (Signed)
Spoke with pharmacy, it is not a PA that is needed, the qty limitations has been exceeded.  Patient is only allowed 4 tablets per day for qty of 120.    [facility] [id number] Patient ID [id number]

## 2016-11-24 NOTE — Telephone Encounter (Signed)
Spoke with [facility] and received PA for qty 180 for hydrocodone-apap 10-325mg  through 05/27/17 obtained, [facility] notified.

## 2016-11-29 ENCOUNTER — Other Ambulatory Visit: Payer: Self-pay | Admitting: Internal Medicine

## 2016-11-30 ENCOUNTER — Other Ambulatory Visit: Payer: Self-pay | Admitting: Internal Medicine

## 2016-12-07 NOTE — Telephone Encounter (Signed)
pts coming in on 8/28 °

## 2016-12-26 ENCOUNTER — Other Ambulatory Visit: Payer: Self-pay | Admitting: Internal Medicine

## 2017-01-02 ENCOUNTER — Other Ambulatory Visit: Payer: Self-pay | Admitting: Internal Medicine

## 2017-01-12 ENCOUNTER — Encounter: Payer: Self-pay | Admitting: Nurse Practitioner

## 2017-01-12 ENCOUNTER — Ambulatory Visit: Payer: 59 | Admitting: Pain Medicine

## 2017-01-12 ENCOUNTER — Encounter (INDEPENDENT_AMBULATORY_CARE_PROVIDER_SITE_OTHER): Payer: Self-pay

## 2017-01-12 ENCOUNTER — Ambulatory Visit: Payer: 59 | Attending: Pain Medicine | Admitting: Nurse Practitioner

## 2017-01-12 VITALS — BP 122/89 | HR 87 | Temp 98.4°F | Resp 16 | Ht 68.0 in | Wt 140.0 lb

## 2017-01-12 DIAGNOSIS — M5441 Lumbago with sciatica, right side: Secondary | ICD-10-CM | POA: Diagnosis not present

## 2017-01-12 DIAGNOSIS — G8929 Other chronic pain: Secondary | ICD-10-CM | POA: Diagnosis not present

## 2017-01-12 DIAGNOSIS — F419 Anxiety disorder, unspecified: Secondary | ICD-10-CM | POA: Insufficient documentation

## 2017-01-12 DIAGNOSIS — M545 Low back pain: Secondary | ICD-10-CM | POA: Diagnosis not present

## 2017-01-12 DIAGNOSIS — G90511 Complex regional pain syndrome I of right upper limb: Secondary | ICD-10-CM | POA: Insufficient documentation

## 2017-01-12 DIAGNOSIS — M25551 Pain in right hip: Secondary | ICD-10-CM | POA: Diagnosis not present

## 2017-01-12 DIAGNOSIS — E039 Hypothyroidism, unspecified: Secondary | ICD-10-CM | POA: Diagnosis not present

## 2017-01-12 DIAGNOSIS — F329 Major depressive disorder, single episode, unspecified: Secondary | ICD-10-CM | POA: Diagnosis not present

## 2017-01-12 DIAGNOSIS — M79601 Pain in right arm: Secondary | ICD-10-CM | POA: Insufficient documentation

## 2017-01-12 DIAGNOSIS — G894 Chronic pain syndrome: Secondary | ICD-10-CM | POA: Diagnosis not present

## 2017-01-12 DIAGNOSIS — Z79891 Long term (current) use of opiate analgesic: Secondary | ICD-10-CM | POA: Diagnosis not present

## 2017-01-12 DIAGNOSIS — M25531 Pain in right wrist: Secondary | ICD-10-CM | POA: Diagnosis not present

## 2017-01-12 MED ORDER — HYDROCODONE-ACETAMINOPHEN 10-325 MG PO TABS: 1.0000 | ORAL_TABLET | ORAL | 0 refills | Status: DC | PRN

## 2017-01-12 NOTE — Patient Instructions (Signed)
Patient will start with stretching and increased walking. She will follow up with a phone call if this is not effective to have a repeat hip xray. Continue to use Aleve.

## 2017-01-12 NOTE — Progress Notes (Signed)
Nursing Pain Medication Assessment:  Safety precautions to be maintained throughout the outpatient stay will include: orient to surroundings, keep bed in low position, maintain call bell within reach at all times, provide assistance with transfer out of bed and ambulation.  Medication Inspection Compliance: Pill count conducted under aseptic conditions, in front of the patient. Neither the pills nor the bottle was removed from the patient's sight at any time. Once count was completed pills were immediately returned to the patient in their original bottle.  Medication: See above Pill/Patch Count: 54 of 180 pills remain Pill/Patch Appearance: Markings consistent with prescribed medication Bottle Appearance: Standard pharmacy container. Clearly labeled. Filled Date: 04 / 04 / 2018 Last Medication intake:  Today
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
## 2017-01-13 ENCOUNTER — Other Ambulatory Visit: Payer: Self-pay | Admitting: Internal Medicine

## 2017-03-03 ENCOUNTER — Other Ambulatory Visit: Payer: Self-pay | Admitting: Internal Medicine

## 2017-03-23 ENCOUNTER — Encounter: Payer: Self-pay | Admitting: *Deleted

## 2017-03-23 ENCOUNTER — Ambulatory Visit
Admission: EM | Admit: 2017-03-23 | Discharge: 2017-03-23 | Disposition: A | Discharge status: Home / Self Care | Payer: 59 | Attending: Family Medicine | Admitting: Family Medicine

## 2017-03-23 DIAGNOSIS — L237 Allergic contact dermatitis due to plants, except food: Secondary | ICD-10-CM

## 2017-03-23 DIAGNOSIS — R21 Rash and other nonspecific skin eruption: Secondary | ICD-10-CM

## 2017-03-23 MED ORDER — PREDNISONE 20 MG PO TABS: ORAL_TABLET | ORAL | 0 refills | Status: DC

## 2017-03-23 NOTE — ED Triage Notes (Signed)
Possible poison ivy/oak since Saturday. C/o facial edema involving right eye, rash to arms, face, and abd.
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
## 2017-04-09 ENCOUNTER — Other Ambulatory Visit: Payer: Self-pay | Admitting: Internal Medicine

## 2017-04-10 ENCOUNTER — Ambulatory Visit: Payer: 59 | Admitting: Nurse Practitioner
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
## 2017-04-25 ENCOUNTER — Ambulatory Visit: Payer: BLUE CROSS/BLUE SHIELD | Attending: Nurse Practitioner | Admitting: Nurse Practitioner

## 2017-04-25 ENCOUNTER — Encounter: Payer: Self-pay | Admitting: Nurse Practitioner

## 2017-04-25 VITALS — BP 127/76 | HR 75 | Temp 98.1°F | Resp 16 | Ht 67.0 in | Wt 135.0 lb

## 2017-04-25 DIAGNOSIS — G8929 Other chronic pain: Secondary | ICD-10-CM

## 2017-04-25 DIAGNOSIS — Z79899 Other long term (current) drug therapy: Secondary | ICD-10-CM | POA: Insufficient documentation

## 2017-04-25 DIAGNOSIS — M25551 Pain in right hip: Secondary | ICD-10-CM

## 2017-04-25 DIAGNOSIS — Z79891 Long term (current) use of opiate analgesic: Secondary | ICD-10-CM | POA: Diagnosis not present

## 2017-04-25 DIAGNOSIS — M545 Low back pain: Secondary | ICD-10-CM | POA: Diagnosis not present

## 2017-04-25 DIAGNOSIS — Z5181 Encounter for therapeutic drug level monitoring: Secondary | ICD-10-CM | POA: Diagnosis present

## 2017-04-25 DIAGNOSIS — M79601 Pain in right arm: Secondary | ICD-10-CM | POA: Diagnosis not present

## 2017-04-25 DIAGNOSIS — G894 Chronic pain syndrome: Secondary | ICD-10-CM | POA: Diagnosis not present

## 2017-04-25 MED ORDER — HYDROCODONE-ACETAMINOPHEN 10-325 MG PO TABS: 1.0000 | ORAL_TABLET | ORAL | 0 refills | Status: DC | PRN

## 2017-04-25 NOTE — Patient Instructions (Addendum)
____________________________________________________________________________________________  Medication Rules  Applies to: All patients receiving prescriptions (written or electronic).  Pharmacy of record: Pharmacy where electronic prescriptions will be sent. If written prescriptions are taken to a different pharmacy, please inform the nursing staff. The pharmacy listed in the electronic medical record should be the one where you would like electronic prescriptions to be sent.  Prescription refills: Only during scheduled appointments. Applies to both, written and electronic prescriptions.  NOTE: The following applies primarily to controlled substances (Opioid* Pain Medications).   Patient's responsibilities: 1. Pain Pills: Bring all pain pills to every appointment (except for procedure appointments). 2. Pill Bottles: Bring pills in original pharmacy bottle. Always bring newest bottle. Bring bottle, even if empty. 3. Medication refills: You are responsible for knowing and keeping track of what medications you need refilled. The day before your appointment, write a list of all prescriptions that need to be refilled. Bring that list to your appointment and give it to the admitting nurse. Prescriptions will be written only during appointments. If you forget a medication, it will not be "Called in", "Faxed", or "electronically sent". You will need to get another appointment to get these prescribed. 4. Prescription Accuracy: You are responsible for carefully inspecting your prescriptions before leaving our office. Have the discharge nurse carefully go over each prescription with you, before taking them home. Make sure that your name is accurately spelled, that your address is correct. Check the name and dose of your medication to make sure it is accurate. Check the number of pills, and the written instructions to make sure they are clear and accurate. Make sure that you are given enough medication to  last until your next medication refill appointment. 5. Taking Medication: Take medication as prescribed. Never take more pills than instructed. Never take medication more frequently than prescribed. Taking less pills or less frequently is permitted and encouraged, when it comes to controlled substances (written prescriptions).  6. Inform other Doctors: Always inform, all of your healthcare providers, of all the medications you take. 7. Pain Medication from other Providers: You are not allowed to accept any additional pain medication from any other Doctor or Healthcare provider. There are two exceptions to this rule. (see below) In the event that you require additional pain medication, you are responsible for notifying us, as stated below. 8. Medication Agreement: You are responsible for carefully reading and following our Medication Agreement. This must be signed before receiving any prescriptions from our practice. Safely store a copy of your signed Agreement. Violations to the Agreement will result in no further prescriptions. (Additional copies of our Medication Agreement are available upon request.) 9. Laws, Rules, & Regulations: All patients are expected to follow all Federal and State Laws, Statutes, Rules, & Regulations. Ignorance of the Laws does not constitute a valid excuse. The use of any illegal substances is prohibited. 10. Adopted CDC guidelines & recommendations: Target dosing levels will be at or below 60 MME/day. Use of benzodiazepines** is not recommended.  Exceptions: There are only two exceptions to the rule of not receiving pain medications from other Healthcare Providers. 1. Exception #1 (Emergencies): In the event of an emergency (i.e.: accident requiring emergency care), you are allowed to receive additional pain medication. However, you are responsible for: As soon as you are able, call our office [phone], at any time of the day or night, and leave a message stating your  name, the date and nature of the emergency, and the name and dose of the medication   prescribed. In the event that your call is answered by a member of our staff, make sure to document and save the date, time, and the name of the person that took your information.  2. Exception #2 (Planned Surgery): In the event that you are scheduled by another doctor or dentist to have any type of surgery or procedure, you are allowed (for a period no longer than 30 days), to receive additional pain medication, for the acute post-op pain. However, in this case, you are responsible for picking up a copy of our "Post-op Pain Management for Surgeons" handout, and giving it to your surgeon or dentist. This document is available at our office, and does not require an appointment to obtain it. Simply go to our office during business hours (Monday-Thursday from 8:00 AM to 4:00 PM) (Friday 8:00 AM to 12:00 Noon) or if you have a scheduled appointment with us, prior to your surgery, and ask for it by name. In addition, you will need to provide us with your name, name of your surgeon, type of surgery, and date of procedure or surgery.  *Opioid medications include: morphine, codeine, oxycodone, oxymorphone, hydrocodone, hydromorphone, meperidine, tramadol, tapentadol, buprenorphine, fentanyl, methadone. **Benzodiazepine medications include: diazepam (Valium), alprazolam (Xanax), clonazepam (Klonopine), lorazepam (Ativan), clorazepate (Tranxene), chlordiazepoxide (Librium), estazolam (Prosom), oxazepam (Serax), temazepam (Restoril), triazolam (Halcion)  ____________________________________________________________________________________________ Pain Management Discharge Instructions  General Discharge Instructions :  If you need to reach your doctor call: Monday-Friday 8:00 am - 4:00 pm at [phone] or toll free [phone].  After clinic hours [phone] to have operator reach doctor.  Bring all of your medication bottles  to all your appointments in the pain clinic.  To cancel or reschedule your appointment with Pain Management please remember to call 24 hours in advance to avoid a fee.  Refer to the educational materials which you have been given on: General Risks, I had my Procedure. Discharge Instructions, Post Sedation.  Post Procedure Instructions:  The drugs you were given will stay in your system until tomorrow, so for the next 24 hours you should not drive, make any legal decisions or drink any alcoholic beverages.  You may eat anything you prefer, but it is better to start with liquids then soups and crackers, and gradually work up to solid foods.  Please notify your doctor immediately if you have any unusual bleeding, trouble breathing or pain that is not related to your normal pain.  Depending on the type of procedure that was done, some parts of your body may feel week and/or numb.  This usually clears up by tonight or the next day.  Walk with the use of an assistive device or accompanied by an adult for the 24 hours.  You may use ice on the affected area for the first 24 hours.  Put ice in a Ziploc bag and cover with a towel and place against area 15 minutes on 15 minutes off.  You may switch to heat after 24 hours. 

## 2017-04-25 NOTE — Progress Notes (Signed)
Nursing Pain Medication Assessment:  Safety precautions to be maintained throughout the outpatient stay will include: orient to surroundings, keep bed in low position, maintain call bell within reach at all times, provide assistance with transfer out of bed and ambulation.  Medication Inspection Compliance: Pill count conducted under aseptic conditions, in front of the patient. Neither the pills nor the bottle was removed from the patient's sight at any time. Once count was completed pills were immediately returned to the patient in their original bottle.  Medication: Hydrocodone/APAP Pill/Patch Count: 154 of 180 pills remain Pill/Patch Appearance: Markings consistent with prescribed medication Bottle Appearance: Standard pharmacy container. Clearly labeled. Filled Date: 08 / 01 / 2018 Last Medication intake:  Today

## 2017-04-25 NOTE — Progress Notes (Addendum)
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 benzodiazepines** is not recommended.  Exceptions: There are only two exceptions to the rule of not receiving pain medications from other Healthcare Providers. 1. Exception #1 (Emergencies): In the event of an emergency (i.e.: accident requiring emergency care), you are allowed to receive additional pain medication. However, you are responsible for: As soon as you are able, call our office [phone], at any time of the day or night, and leave a message stating your name, the date and nature of the emergency, and the name and dose of the medication prescribed. In the event that your call is answered by a member of our staff, make sure to document and save the date, time, and the name of the person that took your information.  2. Exception #2 (Planned Surgery): In the event that you are scheduled by another doctor or dentist to  have any type of surgery or procedure, you are allowed (for a period no longer than 30 days), to receive additional pain medication, for the acute post-op pain. However, in this case, you are responsible for picking up a copy of our "Post-op Pain Management for Surgeons" handout, and giving it to your surgeon or dentist. This document is available at our office, and does not require an appointment to obtain it. Simply go to our office during business hours (Monday-Thursday from 8:00 AM to 4:00 PM) (Friday 8:00 AM to 12:00 Noon) or if you have a scheduled appointment with [place], prior to your surgery, and ask for it by name. In addition, you will need to provide [place] with your name, name of your surgeon, type of surgery, and date of procedure or surgery.  *Opioid medications include: morphine, codeine, oxycodone, oxymorphone, hydrocodone, hydromorphone, meperidine, tramadol, tapentadol, buprenorphine, fentanyl, methadone. **Benzodiazepine medications include: diazepam (Valium), alprazolam (Xanax), clonazepam (Klonopine), lorazepam (Ativan), clorazepate (Tranxene), chlordiazepoxide (Librium), estazolam (Prosom), oxazepam (Serax), temazepam (Restoril), triazolam (Halcion)  ____________________________________________________________________________________________ Pain Management Discharge Instructions  General Discharge Instructions :  If you need to reach your doctor call: Monday-Friday 8:00 am - 4:00 pm at [phone] or toll free [phone].  After clinic hours [phone] to have operator reach doctor.  Bring all of your medication bottles to all your appointments in the pain clinic.  To cancel or reschedule your appointment with Pain Management please remember to call 24 hours in advance to avoid a fee.  Refer to the educational materials which you have been given on: General Risks, I had my Procedure. Discharge Instructions, Post Sedation.  Post Procedure Instructions:  The drugs you were  given will stay in your system until tomorrow, so for the next 24 hours you should not drive, make any legal decisions or drink any alcoholic beverages.  You may eat anything you prefer, but it is better to start with liquids then soups and crackers, and gradually work up to solid foods.  Please notify your doctor immediately if you have any unusual bleeding, trouble breathing or pain that is not related to your normal pain.  Depending on the type of procedure that was done, some parts of your body may feel week and/or numb.  This usually clears up by tonight or the next day.  Walk with the use of an assistive  device or accompanied by an adult for the 24 hours.  You may use ice on the affected area for the first 24 hours.  Put ice in a Ziploc bag and cover with a towel and place against area 15 minutes on 15 minutes off.  You may switch to heat after 24 hours.

## 2017-05-16 ENCOUNTER — Encounter: Payer: Self-pay | Admitting: Internal Medicine

## 2017-05-16 ENCOUNTER — Ambulatory Visit (INDEPENDENT_AMBULATORY_CARE_PROVIDER_SITE_OTHER): Payer: BLUE CROSS/BLUE SHIELD | Admitting: Internal Medicine

## 2017-05-16 VITALS — BP 122/80 | HR 76 | Ht 67.0 in | Wt 139.2 lb

## 2017-05-16 DIAGNOSIS — Z1211 Encounter for screening for malignant neoplasm of colon: Secondary | ICD-10-CM | POA: Diagnosis not present

## 2017-05-16 DIAGNOSIS — E034 Atrophy of thyroid (acquired): Secondary | ICD-10-CM

## 2017-05-16 DIAGNOSIS — L2489 Irritant contact dermatitis due to other agents: Secondary | ICD-10-CM

## 2017-05-16 DIAGNOSIS — Z23 Encounter for immunization: Secondary | ICD-10-CM | POA: Diagnosis not present

## 2017-05-16 DIAGNOSIS — Z1239 Encounter for other screening for malignant neoplasm of breast: Secondary | ICD-10-CM

## 2017-05-16 DIAGNOSIS — Z0000 Encounter for general adult medical examination without abnormal findings: Secondary | ICD-10-CM | POA: Diagnosis not present

## 2017-05-16 DIAGNOSIS — F064 Anxiety disorder due to known physiological condition: Secondary | ICD-10-CM

## 2017-05-16 LAB — POCT URINALYSIS DIPSTICK
BILIRUBIN UA: NEGATIVE
GLUCOSE UA: NEGATIVE
KETONES UA: NEGATIVE
NITRITE UA: NEGATIVE
PH UA: 7.5 (ref 5.0–8.0)
Protein, UA: NEGATIVE
RBC UA: NEGATIVE
Urobilinogen, UA: 0.2 E.U./dL

## 2017-05-16 MED ORDER — ALPRAZOLAM 0.5 MG PO TABS: 0.5000 mg | ORAL_TABLET | Freq: Two times a day (BID) | ORAL | 5 refills | Status: DC | PRN

## 2017-05-16 MED ORDER — PREDNISONE 10 MG PO TABS: ORAL_TABLET | ORAL | 0 refills | Status: DC

## 2017-05-16 MED ORDER — VENLAFAXINE HCL ER 75 MG PO CP24: 75.0000 mg | ORAL_CAPSULE | Freq: Every day | ORAL | 1 refills | Status: DC
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
## 2017-05-16 NOTE — Patient Instructions (Signed)
**Call your insurance to verify coverage of Cologuard before submitting the sample.  Breast Self-Awareness Breast self-awareness means being familiar with how your breasts look and feel. It involves checking your breasts regularly and reporting any changes to your health care provider. Practicing breast self-awareness is important. A change in your breasts can be a sign of a serious medical problem. Being familiar with how your breasts look and feel allows you to find any problems early, when treatment is more likely to be successful. All women should practice breast self-awareness, including women who have had breast implants. How to do a breast self-exam One way to learn what is normal for your breasts and whether your breasts are changing is to do a breast self-exam. To do a breast self-exam: Look for Changes  1. Remove all the clothing above your waist. 2. Stand in front of a mirror in a room with good lighting. 3. Put your hands on your hips. 4. Push your hands firmly downward. 5. Compare your breasts in the mirror. Look for differences between them (asymmetry), such as: ? Differences in shape. ? Differences in size. ? Puckers, dips, and bumps in one breast and not the other. 6. Look at each breast for changes in your skin, such as: ? Redness. ? Scaly areas. 7. Look for changes in your nipples, such as: ? Discharge. ? Bleeding. ? Dimpling. ? Redness. ? A change in position. Feel for Changes  Carefully feel your breasts for lumps and changes. It is best to do this while lying on your back on the floor and again while sitting or standing in the shower or tub with soapy water on your skin. Feel each breast in the following way:  Place the arm on the side of the breast you are examining above your head.  Feel your breast with the other hand.  Start in the nipple area and make  inch (2 cm) overlapping circles to feel your breast. Use the pads of your three middle fingers to do  this. Apply light pressure, then medium pressure, then firm pressure. The light pressure will allow you to feel the tissue closest to the skin. The medium pressure will allow you to feel the tissue that is a little deeper. The firm pressure will allow you to feel the tissue close to the ribs.  Continue the overlapping circles, moving downward over the breast until you feel your ribs below your breast.  Move one finger-width toward the center of the body. Continue to use the  inch (2 cm) overlapping circles to feel your breast as you move slowly up toward your collarbone.  Continue the up and down exam using all three pressures until you reach your armpit.  Write Down What You Find  Write down what is normal for each breast and any changes that you find. Keep a written record with breast changes or normal findings for each breast. By writing this information down, you do not need to depend only on memory for size, tenderness, or location. Write down where you are in your menstrual cycle, if you are still menstruating. If you are having trouble noticing differences in your breasts, do not get discouraged. With time you will become more familiar with the variations in your breasts and more comfortable with the exam. How often should I examine my breasts? Examine your breasts every month. If you are breastfeeding, the best time to examine your breasts is after a feeding or after using a breast pump. If you  menstruate, the best time to examine your breasts is 5-7 days after your period is over. During your period, your breasts are lumpier, and it may be more difficult to notice changes. When should I see my health care provider? See your health care provider if you notice:  A change in shape or size of your breasts or nipples.  A change in the skin of your breast or nipples, such as a reddened or scaly area.  Unusual discharge from your nipples.  A lump or thick area that was not there  before.  Pain in your breasts.  Anything that concerns you.  This information is not intended to replace advice given to you by your health care provider. Make sure you discuss any questions you have with your health care provider. Document Released: 09/05/2005 Document Revised: 02/11/2016 Document Reviewed: 07/26/2015 Elsevier Interactive Patient Education  [employer].

## 2017-05-18 LAB — PAP IG AND HPV HIGH-RISK
HPV, HIGH-RISK: NEGATIVE
PAP Smear Comment: 0

## 2017-05-18 LAB — PLEASE NOTE

## 2017-05-26 LAB — COMPREHENSIVE METABOLIC PANEL
A/G RATIO: 2 (ref 1.2–2.2)
ALT: 20 IU/L (ref 0–32)
AST: 26 IU/L (ref 0–40)
Albumin: 4.3 g/dL (ref 3.5–5.5)
Alkaline Phosphatase: 47 IU/L (ref 39–117)
BILIRUBIN TOTAL: 0.3 mg/dL (ref 0.0–1.2)
BUN / CREAT RATIO: 16 (ref 9–23)
BUN: 14 mg/dL (ref 6–24)
CALCIUM: 9.6 mg/dL (ref 8.7–10.2)
CO2: 23 mmol/L (ref 20–29)
Chloride: 102 mmol/L (ref 96–106)
Creatinine, Ser: 0.9 mg/dL (ref 0.57–1.00)
GFR, EST AFRICAN AMERICAN: 86 mL/min/{1.73_m2} (ref >59)
GFR, EST NON AFRICAN AMERICAN: 75 mL/min/{1.73_m2} (ref >59)
GLUCOSE: 75 mg/dL (ref 65–99)
Globulin, Total: 2.1 g/dL (ref 1.5–4.5)
Potassium: 4.4 mmol/L (ref 3.5–5.2)
SODIUM: 141 mmol/L (ref 134–144)
Total Protein: 6.4 g/dL (ref 6.0–8.5)

## 2017-05-26 LAB — CBC WITH DIFFERENTIAL/PLATELET
BASOS ABS: 0.2 10*3/uL (ref 0.0–0.2)
Basos: 4 %
EOS (ABSOLUTE): 0.7 10*3/uL — AB (ref 0.0–0.4)
Eos: 16 %
Hematocrit: 29.9 % — ABNORMAL LOW (ref 34.0–46.6)
Hemoglobin: 9.3 g/dL — ABNORMAL LOW (ref 11.1–15.9)
IMMATURE GRANS (ABS): 0 10*3/uL (ref 0.0–0.1)
IMMATURE GRANULOCYTES: 0 %
LYMPHS: 29 %
Lymphocytes Absolute: 1.4 10*3/uL (ref 0.7–3.1)
MCH: 26.3 pg — ABNORMAL LOW (ref 26.6–33.0)
MCHC: 31.1 g/dL — ABNORMAL LOW (ref 31.5–35.7)
MCV: 85 fL (ref 79–97)
MONOS ABS: 0.4 10*3/uL (ref 0.1–0.9)
Monocytes: 8 %
Neutrophils Absolute: 2.1 10*3/uL (ref 1.4–7.0)
Neutrophils: 43 %
PLATELETS: 249 10*3/uL (ref 150–379)
RBC: 3.54 x10E6/uL — ABNORMAL LOW (ref 3.77–5.28)
RDW: 12.9 % (ref 12.3–15.4)
WBC: 4.7 10*3/uL (ref 3.4–10.8)

## 2017-05-26 LAB — LIPID PANEL
CHOL/HDL RATIO: 1.9 ratio (ref 0.0–4.4)
Cholesterol, Total: 177 mg/dL (ref 100–199)
HDL: 91 mg/dL (ref >39)
LDL Calculated: 67 mg/dL (ref 0–99)
Triglycerides: 95 mg/dL (ref 0–149)
VLDL Cholesterol Cal: 19 mg/dL (ref 5–40)

## 2017-05-26 LAB — TSH: TSH: 1.38 u[IU]/mL (ref 0.450–4.500)

## 2017-05-29 NOTE — Progress Notes (Signed)
Add Iron, TIBC, and Folate with [facility]. Awaiting results.

## 2017-05-30 ENCOUNTER — Telehealth: Payer: Self-pay

## 2017-05-30 LAB — SPECIMEN STATUS REPORT

## 2017-05-30 LAB — IRON AND TIBC
Iron Saturation: 6 % — CL (ref 15–55)
Iron: 26 ug/dL — ABNORMAL LOW (ref 27–159)
Total Iron Binding Capacity: 427 ug/dL (ref 250–450)
UIBC: 401 ug/dL (ref 131–425)

## 2017-05-30 LAB — B12 AND FOLATE PANEL: Vitamin B-12: 983 pg/mL (ref 232–1245)

## 2017-05-30 NOTE — Telephone Encounter (Signed)
Called and left Vm for pt to inform I filled out work physical form- but unable to fax to the number on the form. Told pt she can pick up form in the office or call with different fax to try. Awaiting pt call back to discuss.

## 2017-07-26 ENCOUNTER — Ambulatory Visit: Payer: BLUE CROSS/BLUE SHIELD | Attending: Nurse Practitioner | Admitting: Nurse Practitioner

## 2017-07-26 ENCOUNTER — Encounter: Payer: Self-pay | Admitting: Nurse Practitioner

## 2017-07-26 ENCOUNTER — Telehealth: Payer: Self-pay | Admitting: *Deleted

## 2017-07-26 ENCOUNTER — Ambulatory Visit: Payer: BLUE CROSS/BLUE SHIELD | Admitting: Nurse Practitioner

## 2017-07-26 VITALS — BP 137/84 | HR 83 | Temp 98.1°F | Resp 16 | Ht 67.0 in | Wt 140.0 lb

## 2017-07-26 DIAGNOSIS — F329 Major depressive disorder, single episode, unspecified: Secondary | ICD-10-CM | POA: Diagnosis not present

## 2017-07-26 DIAGNOSIS — Z79899 Other long term (current) drug therapy: Secondary | ICD-10-CM | POA: Insufficient documentation

## 2017-07-26 DIAGNOSIS — E034 Atrophy of thyroid (acquired): Secondary | ICD-10-CM | POA: Insufficient documentation

## 2017-07-26 DIAGNOSIS — Z79891 Long term (current) use of opiate analgesic: Secondary | ICD-10-CM | POA: Diagnosis not present

## 2017-07-26 DIAGNOSIS — G90511 Complex regional pain syndrome I of right upper limb: Secondary | ICD-10-CM | POA: Diagnosis not present

## 2017-07-26 DIAGNOSIS — M545 Low back pain, unspecified: Secondary | ICD-10-CM

## 2017-07-26 DIAGNOSIS — M25551 Pain in right hip: Secondary | ICD-10-CM | POA: Diagnosis not present

## 2017-07-26 DIAGNOSIS — G894 Chronic pain syndrome: Secondary | ICD-10-CM | POA: Diagnosis not present

## 2017-07-26 DIAGNOSIS — G8929 Other chronic pain: Secondary | ICD-10-CM

## 2017-07-26 DIAGNOSIS — F419 Anxiety disorder, unspecified: Secondary | ICD-10-CM | POA: Diagnosis not present

## 2017-07-26 DIAGNOSIS — Z5181 Encounter for therapeutic drug level monitoring: Secondary | ICD-10-CM | POA: Insufficient documentation

## 2017-07-26 MED ORDER — HYDROCODONE-ACETAMINOPHEN 10-325 MG PO TABS: 1.0000 | ORAL_TABLET | ORAL | 0 refills | Status: DC | PRN

## 2017-07-26 NOTE — Progress Notes (Signed)
Nursing Pain Medication Assessment:  Safety precautions to be maintained throughout the outpatient stay will include: orient to surroundings, keep bed in low position, maintain call bell within reach at all times, provide assistance with transfer out of bed and ambulation.  Medication Inspection Compliance: Pill count conducted under aseptic conditions, in front of the patient. Neither the pills nor the bottle was removed from the patient's sight at any time. Once count was completed pills were immediately returned to the patient in their original bottle.  Medication: Hydrocodone/APAP Pill/Patch Count: 148 of 180 pills remain Pill/Patch Appearance: Markings consistent with prescribed medication Bottle Appearance: Standard pharmacy container. Clearly labeled. Filled Date: 6711 / 02 / 2018 Last Medication intake:  Today

## 2017-07-26 NOTE — Telephone Encounter (Signed)
Left voicemail for patient to call me with further insurance details.
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
## 2017-07-26 NOTE — Patient Instructions (Signed)

## 2017-08-01 LAB — TOXASSURE SELECT 13 (MW), URINE

## 2017-10-19 ENCOUNTER — Other Ambulatory Visit: Payer: Self-pay

## 2017-10-19 ENCOUNTER — Ambulatory Visit: Payer: BLUE CROSS/BLUE SHIELD | Attending: Nurse Practitioner | Admitting: Nurse Practitioner

## 2017-10-19 ENCOUNTER — Encounter: Payer: Self-pay | Admitting: Nurse Practitioner

## 2017-10-19 VITALS — BP 124/84 | HR 80 | Temp 97.8°F | Resp 18 | Ht 67.0 in | Wt 140.0 lb

## 2017-10-19 DIAGNOSIS — Z888 Allergy status to other drugs, medicaments and biological substances status: Secondary | ICD-10-CM | POA: Insufficient documentation

## 2017-10-19 DIAGNOSIS — Z9889 Other specified postprocedural states: Secondary | ICD-10-CM | POA: Insufficient documentation

## 2017-10-19 DIAGNOSIS — F329 Major depressive disorder, single episode, unspecified: Secondary | ICD-10-CM | POA: Insufficient documentation

## 2017-10-19 DIAGNOSIS — G894 Chronic pain syndrome: Secondary | ICD-10-CM | POA: Diagnosis present

## 2017-10-19 DIAGNOSIS — Z90710 Acquired absence of both cervix and uterus: Secondary | ICD-10-CM | POA: Diagnosis not present

## 2017-10-19 DIAGNOSIS — G90511 Complex regional pain syndrome I of right upper limb: Secondary | ICD-10-CM | POA: Insufficient documentation

## 2017-10-19 DIAGNOSIS — M545 Low back pain: Secondary | ICD-10-CM | POA: Diagnosis not present

## 2017-10-19 DIAGNOSIS — Z818 Family history of other mental and behavioral disorders: Secondary | ICD-10-CM | POA: Insufficient documentation

## 2017-10-19 DIAGNOSIS — Z79899 Other long term (current) drug therapy: Secondary | ICD-10-CM | POA: Diagnosis not present

## 2017-10-19 DIAGNOSIS — G8929 Other chronic pain: Secondary | ICD-10-CM | POA: Diagnosis not present

## 2017-10-19 DIAGNOSIS — M25551 Pain in right hip: Secondary | ICD-10-CM

## 2017-10-19 DIAGNOSIS — Z9689 Presence of other specified functional implants: Secondary | ICD-10-CM | POA: Diagnosis not present

## 2017-10-19 DIAGNOSIS — E034 Atrophy of thyroid (acquired): Secondary | ICD-10-CM | POA: Diagnosis not present

## 2017-10-19 MED ORDER — HYDROCODONE-ACETAMINOPHEN 10-325 MG PO TABS: 1.0000 | ORAL_TABLET | ORAL | 0 refills | Status: DC | PRN

## 2017-10-19 NOTE — Progress Notes (Signed)
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 However, you are responsible for: As soon as you are able, call our office [phone], at any time of the day or night, and leave a message stating your name, the date and nature of the emergency, and the name and dose of the medication prescribed. In the event that your call is answered by a member of our staff, make sure to document and save the date, time, and the name of the person that took your information.  2. Exception #2 (Planned Surgery): In the event that you are scheduled by another doctor or dentist to have any type of surgery or procedure, you are allowed (for a period no longer than 30 days), to receive additional pain medication, for the acute post-op pain. However, in this case, you are responsible for picking up a copy of our "Post-op Pain  Management for Surgeons" handout, and giving it to your surgeon or dentist. This document is available at our office, and does not require an appointment to obtain it. Simply go to our office during business hours (Monday-Thursday from 8:00 AM to 4:00 PM) (Friday 8:00 AM to 12:00 Noon) or if you have a scheduled appointment with [place], prior to your surgery, and ask for it by name. In addition, you will need to provide [place] with your name, name of your surgeon, type of surgery, and date of procedure or surgery.  *Opioid medications include: morphine, codeine, oxycodone, oxymorphone, hydrocodone, hydromorphone, meperidine, tramadol, tapentadol, buprenorphine, fentanyl, methadone. **Benzodiazepine medications include: diazepam (Valium), alprazolam (Xanax), clonazepam (Klonopine), lorazepam (Ativan), clorazepate (Tranxene), chlordiazepoxide (Librium), estazolam (Prosom), oxazepam (Serax), temazepam (Restoril), triazolam (Halcion)  ____________________________________________________________________________________________   BMI interpretation table: BMI level Category Range association with higher incidence of chronic pain  <18 kg/m2 Underweight   18.5-24.9 kg/m2 Ideal body weight   25-29.9 kg/m2 Overweight Increased incidence by 20%  30-34.9 kg/m2 Obese (Class I) Increased incidence by 68%  35-39.9 kg/m2 Severe obesity (Class II) Increased incidence by 136%  >40 kg/m2 Extreme obesity (Class III) Increased incidence by 254%   BMI Readings from Last 4 Encounters:  10/19/17 21.93 kg/m  07/26/17 21.93 kg/m  05/16/17 21.80 kg/m  04/25/17 21.14 kg/m   Wt Readings from Last 4 Encounters:  10/19/17 140 lb (63.5 kg)  07/26/17 140 lb (63.5 kg)  05/16/17 139 lb 3.2 oz (63.1 kg)  04/25/17 135 lb (61.2 kg)

## 2017-10-19 NOTE — Patient Instructions (Addendum)
____________________________________________________________________________________________  Medication Rules  Applies to: All patients receiving prescriptions (written or electronic).  Pharmacy of record: Pharmacy where electronic prescriptions will be sent. If written prescriptions are taken to a different pharmacy, please inform the nursing staff. The pharmacy listed in the electronic medical record should be the one where you would like electronic prescriptions to be sent.  Prescription refills: Only during scheduled appointments. Applies to both, written and electronic prescriptions.  NOTE: The following applies primarily to controlled substances (Opioid* Pain Medications).   Patient's responsibilities: 1. Pain Pills: Bring all pain pills to every appointment (except for procedure appointments). 2. Pill Bottles: Bring pills in original pharmacy bottle. Always bring newest bottle. Bring bottle, even if empty. 3. Medication refills: You are responsible for knowing and keeping track of what medications you need refilled. The day before your appointment, write a list of all prescriptions that need to be refilled. Bring that list to your appointment and give it to the admitting nurse. Prescriptions will be written only during appointments. If you forget a medication, it will not be "Called in", "Faxed", or "electronically sent". You will need to get another appointment to get these prescribed. 4. Prescription Accuracy: You are responsible for carefully inspecting your prescriptions before leaving our office. Have the discharge nurse carefully go over each prescription with you, before taking them home. Make sure that your name is accurately spelled, that your address is correct. Check the name and dose of your medication to make sure it is accurate. Check the number of pills, and the written instructions to make sure they are clear and accurate. Make sure that you are given enough medication to  last until your next medication refill appointment. 5. Taking Medication: Take medication as prescribed. Never take more pills than instructed. Never take medication more frequently than prescribed. Taking less pills or less frequently is permitted and encouraged, when it comes to controlled substances (written prescriptions).  6. Inform other Doctors: Always inform, all of your healthcare providers, of all the medications you take. 7. Pain Medication from other Providers: You are not allowed to accept any additional pain medication from any other Doctor or Healthcare provider. There are two exceptions to this rule. (see below) In the event that you require additional pain medication, you are responsible for notifying us, as stated below. 8. Medication Agreement: You are responsible for carefully reading and following our Medication Agreement. This must be signed before receiving any prescriptions from our practice. Safely store a copy of your signed Agreement. Violations to the Agreement will result in no further prescriptions. (Additional copies of our Medication Agreement are available upon request.) 9. Laws, Rules, & Regulations: All patients are expected to follow all Federal and State Laws, Statutes, Rules, & Regulations. Ignorance of the Laws does not constitute a valid excuse. The use of any illegal substances is prohibited. 10. Adopted CDC guidelines & recommendations: Target dosing levels will be at or below 60 MME/day. Use of benzodiazepines** is not recommended.  Exceptions: There are only two exceptions to the rule of not receiving pain medications from other Healthcare Providers. 1. Exception #1 (Emergencies): In the event of an emergency (i.e.: accident requiring emergency care), you are allowed to receive additional pain medication. However, you are responsible for: As soon as you are able, call our office [phone], at any time of the day or night, and leave a message stating your  name, the date and nature of the emergency, and the name and dose of the medication   prescribed. In the event that your call is answered by a member of our staff, make sure to document and save the date, time, and the name of the person that took your information.  2. Exception #2 (Planned Surgery): In the event that you are scheduled by another doctor or dentist to have any type of surgery or procedure, you are allowed (for a period no longer than 30 days), to receive additional pain medication, for the acute post-op pain. However, in this case, you are responsible for picking up a copy of our "Post-op Pain Management for Surgeons" handout, and giving it to your surgeon or dentist. This document is available at our office, and does not require an appointment to obtain it. Simply go to our office during business hours (Monday-Thursday from 8:00 AM to 4:00 PM) (Friday 8:00 AM to 12:00 Noon) or if you have a scheduled appointment with us, prior to your surgery, and ask for it by name. In addition, you will need to provide us with your name, name of your surgeon, type of surgery, and date of procedure or surgery.  *Opioid medications include: morphine, codeine, oxycodone, oxymorphone, hydrocodone, hydromorphone, meperidine, tramadol, tapentadol, buprenorphine, fentanyl, methadone. **Benzodiazepine medications include: diazepam (Valium), alprazolam (Xanax), clonazepam (Klonopine), lorazepam (Ativan), clorazepate (Tranxene), chlordiazepoxide (Librium), estazolam (Prosom), oxazepam (Serax), temazepam (Restoril), triazolam (Halcion)  ____________________________________________________________________________________________   BMI interpretation table: BMI level Category Range association with higher incidence of chronic pain  <18 kg/m2 Underweight   18.5-24.9 kg/m2 Ideal body weight   25-29.9 kg/m2 Overweight Increased incidence by 20%  30-34.9 kg/m2 Obese (Class I) Increased incidence by 68%  35-39.9 kg/m2  Severe obesity (Class II) Increased incidence by 136%  >40 kg/m2 Extreme obesity (Class III) Increased incidence by 254%   BMI Readings from Last 4 Encounters:  10/19/17 21.93 kg/m  07/26/17 21.93 kg/m  05/16/17 21.80 kg/m  04/25/17 21.14 kg/m   Wt Readings from Last 4 Encounters:  10/19/17 140 lb (63.5 kg)  07/26/17 140 lb (63.5 kg)  05/16/17 139 lb 3.2 oz (63.1 kg)  04/25/17 135 lb (61.2 kg)

## 2017-10-19 NOTE — Progress Notes (Signed)
Nursing Pain Medication Assessment:  Safety precautions to be maintained throughout the outpatient stay will include: orient to surroundings, keep bed in low position, maintain call bell within reach at all times, provide assistance with transfer out of bed and ambulation.  Medication Inspection Compliance: Pill count conducted under aseptic conditions, in front of the patient. Neither the pills nor the bottle was removed from the patient's sight at any time. Once count was completed pills were immediately returned to the patient in their original bottle.  Medication: Hydrocodone/APAP Pill/Patch Count: 6 of 180 pills remain Pill/Patch Appearance: Markings consistent with prescribed medication Bottle Appearance: Standard pharmacy container. Clearly labeled. Filled Date: 01 / 02 / 2018 Last Medication intake:  Today

## 2017-11-15 ENCOUNTER — Other Ambulatory Visit: Payer: Self-pay | Admitting: Internal Medicine

## 2017-11-15 DIAGNOSIS — F064 Anxiety disorder due to known physiological condition: Secondary | ICD-10-CM

## 2017-11-16 ENCOUNTER — Ambulatory Visit: Payer: BLUE CROSS/BLUE SHIELD | Admitting: Internal Medicine

## 2017-11-16 NOTE — Telephone Encounter (Signed)
Sent out a No Show letter to reschedule.
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
## 2017-12-13 ENCOUNTER — Other Ambulatory Visit: Payer: Self-pay

## 2017-12-13 ENCOUNTER — Encounter: Payer: Self-pay | Admitting: *Deleted

## 2017-12-13 ENCOUNTER — Ambulatory Visit
Admission: EM | Admit: 2017-12-13 | Discharge: 2017-12-13 | Disposition: A | Discharge status: Home / Self Care | Payer: BLUE CROSS/BLUE SHIELD | Attending: Internal Medicine | Admitting: Internal Medicine

## 2017-12-13 DIAGNOSIS — S8012XA Contusion of left lower leg, initial encounter: Secondary | ICD-10-CM | POA: Diagnosis not present

## 2017-12-13 MED ORDER — MEDICAL COMPRESSION STOCKINGS MISC: 0 refills | Status: DC
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
## 2017-12-13 NOTE — Discharge Instructions (Addendum)
Exam findings today do not suggest fracture/bony injury or blood clot.  Swelling/bruising will take several weeks to resolve at the injury site below the knee, and swelling in the entire lower leg may take several months to subside.  Prescription for an elastic stocking, to help with discomfort from swelling.  Put this on first thing in the morning and take off at bedtime.  Ice and elevation for several minutes, as needed, will also help.  Recheck or followup with your primary care provider if redness/increased swelling/increased pain/drainage from injury site occur.

## 2017-12-13 NOTE — ED Triage Notes (Signed)
Patient injured her left leg 6 days ago when her dog pulled her down. Left leg lower leg is visibly swollen and bruised.

## 2017-12-18 ENCOUNTER — Other Ambulatory Visit: Payer: Self-pay | Admitting: Internal Medicine

## 2017-12-18 DIAGNOSIS — F064 Anxiety disorder due to known physiological condition: Secondary | ICD-10-CM

## 2017-12-22 ENCOUNTER — Ambulatory Visit: Payer: BLUE CROSS/BLUE SHIELD | Admitting: Internal Medicine

## 2017-12-25 ENCOUNTER — Other Ambulatory Visit: Payer: Self-pay | Admitting: Internal Medicine

## 2017-12-25 ENCOUNTER — Ambulatory Visit (INDEPENDENT_AMBULATORY_CARE_PROVIDER_SITE_OTHER): Payer: BLUE CROSS/BLUE SHIELD | Admitting: Internal Medicine

## 2017-12-25 ENCOUNTER — Ambulatory Visit
Admission: RE | Admit: 2017-12-25 | Discharge: 2017-12-25 | Disposition: A | Discharge status: Home / Self Care | Payer: BLUE CROSS/BLUE SHIELD | Source: Ambulatory Visit | Attending: Internal Medicine | Admitting: Internal Medicine

## 2017-12-25 ENCOUNTER — Encounter: Payer: Self-pay | Admitting: Internal Medicine

## 2017-12-25 VITALS — BP 138/92 | HR 97 | Ht 67.0 in | Wt 147.0 lb

## 2017-12-25 DIAGNOSIS — R937 Abnormal findings on diagnostic imaging of other parts of musculoskeletal system: Secondary | ICD-10-CM | POA: Insufficient documentation

## 2017-12-25 DIAGNOSIS — D508 Other iron deficiency anemias: Secondary | ICD-10-CM | POA: Diagnosis not present

## 2017-12-25 DIAGNOSIS — S4991XA Unspecified injury of right shoulder and upper arm, initial encounter: Secondary | ICD-10-CM | POA: Insufficient documentation

## 2017-12-25 DIAGNOSIS — F064 Anxiety disorder due to known physiological condition: Secondary | ICD-10-CM | POA: Diagnosis not present

## 2017-12-25 DIAGNOSIS — E034 Atrophy of thyroid (acquired): Secondary | ICD-10-CM

## 2017-12-25 DIAGNOSIS — X58XXXA Exposure to other specified factors, initial encounter: Secondary | ICD-10-CM | POA: Diagnosis not present

## 2017-12-25 DIAGNOSIS — S4291XA Fracture of right shoulder girdle, part unspecified, initial encounter for closed fracture: Secondary | ICD-10-CM

## 2017-12-25 DIAGNOSIS — D509 Iron deficiency anemia, unspecified: Secondary | ICD-10-CM | POA: Insufficient documentation

## 2017-12-25 MED ORDER — LEVOTHYROXINE SODIUM 50 MCG PO TABS: 50.0000 ug | ORAL_TABLET | Freq: Every day | ORAL | 3 refills | Status: DC
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
## 2017-12-25 NOTE — Progress Notes (Signed)
Patient informed. Stated [place] area is fine for ortho appt.

## 2017-12-26 LAB — TSH: TSH: 1.75 u[IU]/mL (ref 0.450–4.500)

## 2017-12-26 LAB — CBC WITH DIFFERENTIAL/PLATELET
BASOS ABS: 0.1 10*3/uL (ref 0.0–0.2)
Basos: 3 %
EOS (ABSOLUTE): 0.4 10*3/uL (ref 0.0–0.4)
Eos: 10 %
Hematocrit: 29.7 % — ABNORMAL LOW (ref 34.0–46.6)
Hemoglobin: 8.9 g/dL — ABNORMAL LOW (ref 11.1–15.9)
IMMATURE GRANULOCYTES: 0 %
Immature Grans (Abs): 0 10*3/uL (ref 0.0–0.1)
Lymphocytes Absolute: 1 10*3/uL (ref 0.7–3.1)
Lymphs: 21 %
MCH: 21.8 pg — ABNORMAL LOW (ref 26.6–33.0)
MCHC: 30 g/dL — ABNORMAL LOW (ref 31.5–35.7)
MCV: 73 fL — ABNORMAL LOW (ref 79–97)
MONOS ABS: 0.5 10*3/uL (ref 0.1–0.9)
Monocytes: 11 %
NEUTROS PCT: 55 %
Neutrophils Absolute: 2.5 10*3/uL (ref 1.4–7.0)
PLATELETS: 306 10*3/uL (ref 150–379)
RBC: 4.09 x10E6/uL (ref 3.77–5.28)
RDW: 17.9 % — AB (ref 12.3–15.4)
WBC: 4.5 10*3/uL (ref 3.4–10.8)

## 2018-01-02 ENCOUNTER — Other Ambulatory Visit: Payer: Self-pay | Admitting: Orthopedic Surgery

## 2018-01-02 DIAGNOSIS — M25411 Effusion, right shoulder: Secondary | ICD-10-CM

## 2018-01-02 DIAGNOSIS — M25511 Pain in right shoulder: Principal | ICD-10-CM

## 2018-01-15 ENCOUNTER — Other Ambulatory Visit: Payer: Self-pay | Admitting: Internal Medicine

## 2018-01-15 DIAGNOSIS — F064 Anxiety disorder due to known physiological condition: Secondary | ICD-10-CM

## 2018-01-21 ENCOUNTER — Other Ambulatory Visit: Payer: Self-pay | Admitting: Internal Medicine

## 2018-01-21 DIAGNOSIS — E034 Atrophy of thyroid (acquired): Secondary | ICD-10-CM

## 2018-01-23 ENCOUNTER — Ambulatory Visit: Admission: RE | Admit: 2018-01-23 | Payer: BLUE CROSS/BLUE SHIELD | Source: Ambulatory Visit

## 2018-01-23 ENCOUNTER — Ambulatory Visit: Payer: BLUE CROSS/BLUE SHIELD

## 2018-01-29 ENCOUNTER — Encounter: Payer: BLUE CROSS/BLUE SHIELD | Admitting: Nurse Practitioner

## 2018-02-01 ENCOUNTER — Telehealth (HOSPITAL_COMMUNITY): Payer: Self-pay | Admitting: Orthopedic Surgery

## 2018-02-02 ENCOUNTER — Ambulatory Visit: Payer: BLUE CROSS/BLUE SHIELD

## 2018-02-06 ENCOUNTER — Encounter: Payer: Self-pay | Admitting: Nurse Practitioner

## 2018-02-06 ENCOUNTER — Other Ambulatory Visit: Payer: Self-pay

## 2018-02-06 ENCOUNTER — Ambulatory Visit: Payer: BLUE CROSS/BLUE SHIELD | Attending: Nurse Practitioner | Admitting: Nurse Practitioner

## 2018-02-06 VITALS — BP 140/93 | HR 76 | Temp 98.0°F | Resp 16 | Ht 62.0 in | Wt 135.0 lb

## 2018-02-06 DIAGNOSIS — M545 Low back pain, unspecified: Secondary | ICD-10-CM

## 2018-02-06 DIAGNOSIS — E034 Atrophy of thyroid (acquired): Secondary | ICD-10-CM | POA: Insufficient documentation

## 2018-02-06 DIAGNOSIS — G894 Chronic pain syndrome: Secondary | ICD-10-CM | POA: Diagnosis not present

## 2018-02-06 DIAGNOSIS — F1120 Opioid dependence, uncomplicated: Secondary | ICD-10-CM | POA: Insufficient documentation

## 2018-02-06 DIAGNOSIS — Z91410 Personal history of adult physical and sexual abuse: Secondary | ICD-10-CM | POA: Diagnosis not present

## 2018-02-06 DIAGNOSIS — F410 Panic disorder [episodic paroxysmal anxiety] without agoraphobia: Secondary | ICD-10-CM | POA: Diagnosis not present

## 2018-02-06 DIAGNOSIS — M79601 Pain in right arm: Secondary | ICD-10-CM | POA: Diagnosis not present

## 2018-02-06 DIAGNOSIS — M25511 Pain in right shoulder: Secondary | ICD-10-CM | POA: Diagnosis present

## 2018-02-06 DIAGNOSIS — D509 Iron deficiency anemia, unspecified: Secondary | ICD-10-CM | POA: Diagnosis not present

## 2018-02-06 DIAGNOSIS — G90511 Complex regional pain syndrome I of right upper limb: Secondary | ICD-10-CM | POA: Diagnosis not present

## 2018-02-06 DIAGNOSIS — M25551 Pain in right hip: Secondary | ICD-10-CM | POA: Insufficient documentation

## 2018-02-06 DIAGNOSIS — Z79899 Other long term (current) drug therapy: Secondary | ICD-10-CM | POA: Diagnosis not present

## 2018-02-06 DIAGNOSIS — Z79891 Long term (current) use of opiate analgesic: Secondary | ICD-10-CM | POA: Diagnosis not present

## 2018-02-06 DIAGNOSIS — G8929 Other chronic pain: Secondary | ICD-10-CM | POA: Diagnosis not present

## 2018-02-06 DIAGNOSIS — F419 Anxiety disorder, unspecified: Secondary | ICD-10-CM | POA: Diagnosis not present

## 2018-02-06 MED ORDER — HYDROCODONE-ACETAMINOPHEN 10-325 MG PO TABS: 1.0000 | ORAL_TABLET | ORAL | 0 refills | Status: DC | PRN

## 2018-02-06 NOTE — Progress Notes (Signed)
Nursing Pain Medication Assessment:  Safety precautions to be maintained throughout the outpatient stay will include: orient to surroundings, keep bed in low position, maintain call bell within reach at all times, provide assistance with transfer out of bed and ambulation.  Medication Inspection Compliance: Wendy Snyder did not comply with our request to bring her pills to be counted. She was reminded that bringing the medication bottles, even when empty, is a requirement.  Medication: None brought in. Pill/Patch Count: None available to be counted. Bottle Appearance: No container available. Did not bring bottle(s) to appointment. Filled Date: N/A Last Medication intake:  Today

## 2018-02-06 NOTE — Progress Notes (Signed)
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 carefully reading and following our Medication Agreement. This must be signed before receiving any prescriptions from our practice. Safely store a copy of your signed Agreement. Violations to the Agreement will result in no further prescriptions. (Additional copies of our Medication Agreement are available upon request.) 9. Laws, Rules, & Regulations: All patients are expected to follow all Federal and [employer], [employer], Rules, [employer]. Ignorance of the Laws does not constitute a valid excuse. The use of any illegal substances is prohibited. 10. Adopted CDC guidelines & recommendations: Target dosing levels will be at or below 60 MME/day. Use of benzodiazepines** is not recommended.  Exceptions: There are only  two exceptions to the rule of not receiving pain medications from other Healthcare Providers. 1. Exception #1 (Emergencies): In the event of an emergency (i.e.: accident requiring emergency care), you are allowed to receive additional pain medication. However, you are responsible for: As soon as you are able, call our office [phone], at any time of the day or night, and leave a message stating your name, the date and nature of the emergency, and the name and dose of the medication prescribed. In the event that your call is answered by a member of our staff, make sure to document and save the date, time, and the name of the person that took your information.  2. Exception #2 (Planned Surgery): In the event that you are scheduled by another doctor or dentist to have any type of surgery or procedure, you are allowed (for a period no longer than 30 days), to receive additional pain medication, for the acute post-op pain. However, in this case, you are responsible for picking up a copy of our "Post-op Pain Management for Surgeons" handout, and giving it to your surgeon or dentist. This document is available at our office, and does not require an appointment to obtain it. Simply go to our office during business hours (Monday-Thursday from 8:00 AM to 4:00 PM) (Friday 8:00 AM to 12:00 Noon) or if you have a scheduled appointment with [place], prior to your surgery, and ask for it by name. In addition, you will need to provide [place] with your name, name of your surgeon, type of surgery, and date of procedure or surgery.  *Opioid medications include: morphine, codeine, oxycodone, oxymorphone, hydrocodone, hydromorphone, meperidine, tramadol, tapentadol, buprenorphine, fentanyl, methadone. **Benzodiazepine medications include: diazepam (Valium), alprazolam (Xanax), clonazepam (Klonopine), lorazepam (Ativan), clorazepate (Tranxene), chlordiazepoxide (Librium), estazolam (Prosom), oxazepam (Serax), temazepam (Restoril),  triazolam (Halcion) (Last updated: 11/16/2017) ____________________________________________________________________________________________

## 2018-02-06 NOTE — Patient Instructions (Signed)
____________________________________________________________________________________________  Medication Rules  Applies to: All patients receiving prescriptions (written or electronic).  Pharmacy of record: Pharmacy where electronic prescriptions will be sent. If written prescriptions are taken to a different pharmacy, please inform the nursing staff. The pharmacy listed in the electronic medical record should be the one where you would like electronic prescriptions to be sent.  Prescription refills: Only during scheduled appointments. Applies to both, written and electronic prescriptions.  NOTE: The following applies primarily to controlled substances (Opioid* Pain Medications).   Patient's responsibilities: 1. Pain Pills: Bring all pain pills to every appointment (except for procedure appointments). 2. Pill Bottles: Bring pills in original pharmacy bottle. Always bring newest bottle. Bring bottle, even if empty. 3. Medication refills: You are responsible for knowing and keeping track of what medications you need refilled. The day before your appointment, write a list of all prescriptions that need to be refilled. Bring that list to your appointment and give it to the admitting nurse. Prescriptions will be written only during appointments. If you forget a medication, it will not be "Called in", "Faxed", or "electronically sent". You will need to get another appointment to get these prescribed. 4. Prescription Accuracy: You are responsible for carefully inspecting your prescriptions before leaving our office. Have the discharge nurse carefully go over each prescription with you, before taking them home. Make sure that your name is accurately spelled, that your address is correct. Check the name and dose of your medication to make sure it is accurate. Check the number of pills, and the written instructions to make sure they are clear and accurate. Make sure that you are given enough medication to last  until your next medication refill appointment. 5. Taking Medication: Take medication as prescribed. Never take more pills than instructed. Never take medication more frequently than prescribed. Taking less pills or less frequently is permitted and encouraged, when it comes to controlled substances (written prescriptions).  6. Inform other Doctors: Always inform, all of your healthcare providers, of all the medications you take. 7. Pain Medication from other Providers: You are not allowed to accept any additional pain medication from any other Doctor or Healthcare provider. There are two exceptions to this rule. (see below) In the event that you require additional pain medication, you are responsible for notifying us, as stated below. 8. Medication Agreement: You are responsible for carefully reading and following our Medication Agreement. This must be signed before receiving any prescriptions from our practice. Safely store a copy of your signed Agreement. Violations to the Agreement will result in no further prescriptions. (Additional copies of our Medication Agreement are available upon request.) 9. Laws, Rules, & Regulations: All patients are expected to follow all Federal and State Laws, Statutes, Rules, & Regulations. Ignorance of the Laws does not constitute a valid excuse. The use of any illegal substances is prohibited. 10. Adopted CDC guidelines & recommendations: Target dosing levels will be at or below 60 MME/day. Use of benzodiazepines** is not recommended.  Exceptions: There are only two exceptions to the rule of not receiving pain medications from other Healthcare Providers. 1. Exception #1 (Emergencies): In the event of an emergency (i.e.: accident requiring emergency care), you are allowed to receive additional pain medication. However, you are responsible for: As soon as you are able, call our office [phone], at any time of the day or night, and leave a message stating your name, the  date and nature of the emergency, and the name and dose of the medication   prescribed. In the event that your call is answered by a member of our staff, make sure to document and save the date, time, and the name of the person that took your information.  2. Exception #2 (Planned Surgery): In the event that you are scheduled by another doctor or dentist to have any type of surgery or procedure, you are allowed (for a period no longer than 30 days), to receive additional pain medication, for the acute post-op pain. However, in this case, you are responsible for picking up a copy of our "Post-op Pain Management for Surgeons" handout, and giving it to your surgeon or dentist. This document is available at our office, and does not require an appointment to obtain it. Simply go to our office during business hours (Monday-Thursday from 8:00 AM to 4:00 PM) (Friday 8:00 AM to 12:00 Noon) or if you have a scheduled appointment with us, prior to your surgery, and ask for it by name. In addition, you will need to provide us with your name, name of your surgeon, type of surgery, and date of procedure or surgery.  *Opioid medications include: morphine, codeine, oxycodone, oxymorphone, hydrocodone, hydromorphone, meperidine, tramadol, tapentadol, buprenorphine, fentanyl, methadone. **Benzodiazepine medications include: diazepam (Valium), alprazolam (Xanax), clonazepam (Klonopine), lorazepam (Ativan), clorazepate (Tranxene), chlordiazepoxide (Librium), estazolam (Prosom), oxazepam (Serax), temazepam (Restoril), triazolam (Halcion) (Last updated: 11/16/2017) ____________________________________________________________________________________________    

## 2018-02-14 LAB — TOXASSURE SELECT 13 (MW), URINE

## 2018-02-16 NOTE — Progress Notes (Signed)
Nursing Pain Medication Assessment:  Safety precautions to be maintained throughout the outpatient stay will include: orient to surroundings, keep bed in low position, maintain call bell within reach at all times, provide assistance with transfer out of bed and ambulation.  Medication Inspection Compliance: Pill count conducted under aseptic conditions, in front of the patient. Neither the pills nor the bottle was removed from the patient's sight at any time. Once count was completed pills were immediately returned to the patient in their original bottle.  Medication: Hydrocodone/APAP Pill/Patch Count: 3 of 180 pills remain Pill/Patch Appearance: Markings consistent with prescribed medication Bottle Appearance: Standard pharmacy container. Clearly labeled. Filled Date:05/ 01 / 2019 Last Medication intake:  Today

## 2018-03-05 ENCOUNTER — Other Ambulatory Visit: Payer: Self-pay | Admitting: Internal Medicine

## 2018-03-05 DIAGNOSIS — F064 Anxiety disorder due to known physiological condition: Secondary | ICD-10-CM

## 2018-04-17 ENCOUNTER — Telehealth: Payer: Self-pay

## 2018-04-17 NOTE — Telephone Encounter (Signed)
Patient called stating she has a case of poison ivy and has had this several times before. Would like prednisone called into her pharmacy.  Reviewed chart with Dr Judithann GravesBerglund and left VM to  informe patient we have not seen her for poison ivy for a year now. Explained to her that if it was a recent case within the last few months, then we could call medication refill in for her but because its been a year she will need to call the office back and schedule an OV.

## 2018-04-18 ENCOUNTER — Ambulatory Visit: Payer: BLUE CROSS/BLUE SHIELD | Admitting: Internal Medicine

## 2018-04-18 ENCOUNTER — Encounter: Payer: Self-pay | Admitting: Internal Medicine

## 2018-04-18 VITALS — BP 138/82 | HR 103 | Ht 67.0 in | Wt 136.0 lb

## 2018-04-18 DIAGNOSIS — L247 Irritant contact dermatitis due to plants, except food: Secondary | ICD-10-CM | POA: Diagnosis not present

## 2018-04-18 MED ORDER — PREDNISONE 10 MG PO TABS: ORAL_TABLET | ORAL | 1 refills | Status: DC
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
## 2018-05-02 ENCOUNTER — Other Ambulatory Visit: Payer: Self-pay | Admitting: Internal Medicine

## 2018-05-02 DIAGNOSIS — E034 Atrophy of thyroid (acquired): Secondary | ICD-10-CM

## 2018-05-07 ENCOUNTER — Other Ambulatory Visit: Payer: Self-pay

## 2018-05-07 ENCOUNTER — Encounter: Payer: Self-pay | Admitting: Nurse Practitioner

## 2018-05-07 ENCOUNTER — Ambulatory Visit: Payer: BLUE CROSS/BLUE SHIELD | Attending: Nurse Practitioner | Admitting: Nurse Practitioner

## 2018-05-07 VITALS — BP 137/90 | HR 63 | Temp 97.5°F | Ht 68.0 in | Wt 140.0 lb

## 2018-05-07 DIAGNOSIS — Z969 Presence of functional implant, unspecified: Secondary | ICD-10-CM | POA: Diagnosis not present

## 2018-05-07 DIAGNOSIS — Z818 Family history of other mental and behavioral disorders: Secondary | ICD-10-CM | POA: Insufficient documentation

## 2018-05-07 DIAGNOSIS — Z79890 Hormone replacement therapy (postmenopausal): Secondary | ICD-10-CM | POA: Diagnosis not present

## 2018-05-07 DIAGNOSIS — M25531 Pain in right wrist: Secondary | ICD-10-CM | POA: Insufficient documentation

## 2018-05-07 DIAGNOSIS — Z888 Allergy status to other drugs, medicaments and biological substances status: Secondary | ICD-10-CM | POA: Diagnosis not present

## 2018-05-07 DIAGNOSIS — Z79891 Long term (current) use of opiate analgesic: Secondary | ICD-10-CM | POA: Insufficient documentation

## 2018-05-07 DIAGNOSIS — G90511 Complex regional pain syndrome I of right upper limb: Secondary | ICD-10-CM | POA: Insufficient documentation

## 2018-05-07 DIAGNOSIS — G894 Chronic pain syndrome: Secondary | ICD-10-CM

## 2018-05-07 DIAGNOSIS — M25551 Pain in right hip: Secondary | ICD-10-CM | POA: Diagnosis not present

## 2018-05-07 DIAGNOSIS — E034 Atrophy of thyroid (acquired): Secondary | ICD-10-CM | POA: Diagnosis not present

## 2018-05-07 DIAGNOSIS — G8929 Other chronic pain: Secondary | ICD-10-CM

## 2018-05-07 DIAGNOSIS — Z91419 Personal history of unspecified adult abuse: Secondary | ICD-10-CM | POA: Diagnosis not present

## 2018-05-07 DIAGNOSIS — E039 Hypothyroidism, unspecified: Secondary | ICD-10-CM | POA: Diagnosis not present

## 2018-05-07 DIAGNOSIS — Z79899 Other long term (current) drug therapy: Secondary | ICD-10-CM | POA: Diagnosis not present

## 2018-05-07 DIAGNOSIS — M545 Low back pain: Secondary | ICD-10-CM | POA: Diagnosis present

## 2018-05-07 DIAGNOSIS — N6019 Diffuse cystic mastopathy of unspecified breast: Secondary | ICD-10-CM | POA: Insufficient documentation

## 2018-05-07 DIAGNOSIS — F329 Major depressive disorder, single episode, unspecified: Secondary | ICD-10-CM | POA: Insufficient documentation

## 2018-05-07 MED ORDER — HYDROCODONE-ACETAMINOPHEN 10-325 MG PO TABS: 1.0000 | ORAL_TABLET | ORAL | 0 refills | Status: DC | PRN

## 2018-05-07 NOTE — Progress Notes (Signed)
Nursing Pain Medication Assessment:  Safety precautions to be maintained throughout the outpatient stay will include: orient to surroundings, keep bed in low position, maintain call bell within reach at all times, provide assistance with transfer out of bed and ambulation.  Medication Inspection Compliance: Pill count conducted under aseptic conditions, in front of the patient. Neither the pills nor the bottle was removed from the patient's sight at any time. Once count was completed pills were immediately returned to the patient in their original bottle.  Medication: Hydrocodone/APAP Pill/Patch Count: 100 of 180 pills remain Pill/Patch Appearance: Markings consistent with prescribed medication Bottle Appearance: Standard pharmacy container. Clearly labeled. Filled Date: 8 / 5 / 2019 Last Medication intake:  Today

## 2018-05-07 NOTE — Progress Notes (Signed)
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 dentist. This document is available at our office, and does not require an appointment to obtain it. Simply go to our office during business hours (Monday-Thursday from 8:00 AM to 4:00 PM) (Friday 8:00 AM to 12:00 Noon) or if you have a scheduled appointment with [place], prior to your surgery, and ask for it by name. In addition, you will need to provide [place] with your name, name of your surgeon, type of surgery, and date of procedure or surgery.  *Opioid medications include: morphine, codeine, oxycodone, oxymorphone, hydrocodone, hydromorphone, meperidine, tramadol, tapentadol, buprenorphine, fentanyl, methadone. **Benzodiazepine medications include: diazepam (Valium), alprazolam (Xanax), clonazepam (Klonopine), lorazepam (Ativan), clorazepate (Tranxene), chlordiazepoxide  (Librium), estazolam (Prosom), oxazepam (Serax), temazepam (Restoril), triazolam (Halcion) (Last updated: 11/16/2017) ____________________________________________________________________________________________

## 2018-05-07 NOTE — Patient Instructions (Signed)
____________________________________________________________________________________________  Medication Rules  Applies to: All patients receiving prescriptions (written or electronic).  Pharmacy of record: Pharmacy where electronic prescriptions will be sent. If written prescriptions are taken to a different pharmacy, please inform the nursing staff. The pharmacy listed in the electronic medical record should be the one where you would like electronic prescriptions to be sent.  Prescription refills: Only during scheduled appointments. Applies to both, written and electronic prescriptions.  NOTE: The following applies primarily to controlled substances (Opioid* Pain Medications).   Patient's responsibilities: 1. Pain Pills: Bring all pain pills to every appointment (except for procedure appointments). 2. Pill Bottles: Bring pills in original pharmacy bottle. Always bring newest bottle. Bring bottle, even if empty. 3. Medication refills: You are responsible for knowing and keeping track of what medications you need refilled. The day before your appointment, write a list of all prescriptions that need to be refilled. Bring that list to your appointment and give it to the admitting nurse. Prescriptions will be written only during appointments. If you forget a medication, it will not be "Called in", "Faxed", or "electronically sent". You will need to get another appointment to get these prescribed. 4. Prescription Accuracy: You are responsible for carefully inspecting your prescriptions before leaving our office. Have the discharge nurse carefully go over each prescription with you, before taking them home. Make sure that your name is accurately spelled, that your address is correct. Check the name and dose of your medication to make sure it is accurate. Check the number of pills, and the written instructions to make sure they are clear and accurate. Make sure that you are given enough medication to last  until your next medication refill appointment. 5. Taking Medication: Take medication as prescribed. Never take more pills than instructed. Never take medication more frequently than prescribed. Taking less pills or less frequently is permitted and encouraged, when it comes to controlled substances (written prescriptions).  6. Inform other Doctors: Always inform, all of your healthcare providers, of all the medications you take. 7. Pain Medication from other Providers: You are not allowed to accept any additional pain medication from any other Doctor or Healthcare provider. There are two exceptions to this rule. (see below) In the event that you require additional pain medication, you are responsible for notifying us, as stated below. 8. Medication Agreement: You are responsible for carefully reading and following our Medication Agreement. This must be signed before receiving any prescriptions from our practice. Safely store a copy of your signed Agreement. Violations to the Agreement will result in no further prescriptions. (Additional copies of our Medication Agreement are available upon request.) 9. Laws, Rules, & Regulations: All patients are expected to follow all Federal and State Laws, Statutes, Rules, & Regulations. Ignorance of the Laws does not constitute a valid excuse. The use of any illegal substances is prohibited. 10. Adopted CDC guidelines & recommendations: Target dosing levels will be at or below 60 MME/day. Use of benzodiazepines** is not recommended.  Exceptions: There are only two exceptions to the rule of not receiving pain medications from other Healthcare Providers. 1. Exception #1 (Emergencies): In the event of an emergency (i.e.: accident requiring emergency care), you are allowed to receive additional pain medication. However, you are responsible for: As soon as you are able, call our office [phone], at any time of the day or night, and leave a message stating your name, the  date and nature of the emergency, and the name and dose of the medication   prescribed. In the event that your call is answered by a member of our staff, make sure to document and save the date, time, and the name of the person that took your information.  2. Exception #2 (Planned Surgery): In the event that you are scheduled by another doctor or dentist to have any type of surgery or procedure, you are allowed (for a period no longer than 30 days), to receive additional pain medication, for the acute post-op pain. However, in this case, you are responsible for picking up a copy of our "Post-op Pain Management for Surgeons" handout, and giving it to your surgeon or dentist. This document is available at our office, and does not require an appointment to obtain it. Simply go to our office during business hours (Monday-Thursday from 8:00 AM to 4:00 PM) (Friday 8:00 AM to 12:00 Noon) or if you have a scheduled appointment with us, prior to your surgery, and ask for it by name. In addition, you will need to provide us with your name, name of your surgeon, type of surgery, and date of procedure or surgery.  *Opioid medications include: morphine, codeine, oxycodone, oxymorphone, hydrocodone, hydromorphone, meperidine, tramadol, tapentadol, buprenorphine, fentanyl, methadone. **Benzodiazepine medications include: diazepam (Valium), alprazolam (Xanax), clonazepam (Klonopine), lorazepam (Ativan), clorazepate (Tranxene), chlordiazepoxide (Librium), estazolam (Prosom), oxazepam (Serax), temazepam (Restoril), triazolam (Halcion) (Last updated: 11/16/2017) ____________________________________________________________________________________________    

## 2018-05-16 ENCOUNTER — Other Ambulatory Visit: Payer: Self-pay | Admitting: Internal Medicine

## 2018-05-16 DIAGNOSIS — F064 Anxiety disorder due to known physiological condition: Secondary | ICD-10-CM

## 2018-06-14 ENCOUNTER — Telehealth: Payer: Self-pay | Admitting: Internal Medicine

## 2018-06-14 NOTE — Telephone Encounter (Signed)
Pt called in needing appointment to have biometric form filled out for work, form requires labs and is due by 06/18/18, can I set  Her up for ov and not under a physical slot? Please advise.

## 2018-06-18 ENCOUNTER — Encounter: Payer: Self-pay | Admitting: Internal Medicine

## 2018-06-18 ENCOUNTER — Other Ambulatory Visit: Payer: Self-pay | Admitting: Internal Medicine

## 2018-06-18 DIAGNOSIS — F064 Anxiety disorder due to known physiological condition: Secondary | ICD-10-CM

## 2018-07-02 ENCOUNTER — Other Ambulatory Visit: Payer: Self-pay | Admitting: Internal Medicine

## 2018-07-02 DIAGNOSIS — F064 Anxiety disorder due to known physiological condition: Secondary | ICD-10-CM

## 2018-07-28 ENCOUNTER — Other Ambulatory Visit: Payer: Self-pay | Admitting: Internal Medicine

## 2018-07-28 DIAGNOSIS — E034 Atrophy of thyroid (acquired): Secondary | ICD-10-CM

## 2018-08-07 ENCOUNTER — Ambulatory Visit: Payer: BLUE CROSS/BLUE SHIELD | Attending: Nurse Practitioner | Admitting: Nurse Practitioner

## 2018-08-07 ENCOUNTER — Other Ambulatory Visit: Payer: Self-pay

## 2018-08-07 ENCOUNTER — Encounter: Payer: Self-pay | Admitting: Nurse Practitioner

## 2018-08-07 VITALS — BP 126/73 | HR 88 | Temp 97.7°F | Resp 16 | Ht 67.0 in | Wt 140.0 lb

## 2018-08-07 DIAGNOSIS — Z9682 Presence of neurostimulator: Secondary | ICD-10-CM | POA: Insufficient documentation

## 2018-08-07 DIAGNOSIS — M545 Low back pain, unspecified: Secondary | ICD-10-CM

## 2018-08-07 DIAGNOSIS — G90511 Complex regional pain syndrome I of right upper limb: Secondary | ICD-10-CM | POA: Diagnosis not present

## 2018-08-07 DIAGNOSIS — M25551 Pain in right hip: Secondary | ICD-10-CM | POA: Insufficient documentation

## 2018-08-07 DIAGNOSIS — F419 Anxiety disorder, unspecified: Secondary | ICD-10-CM | POA: Diagnosis not present

## 2018-08-07 DIAGNOSIS — F329 Major depressive disorder, single episode, unspecified: Secondary | ICD-10-CM | POA: Insufficient documentation

## 2018-08-07 DIAGNOSIS — M549 Dorsalgia, unspecified: Secondary | ICD-10-CM | POA: Diagnosis present

## 2018-08-07 DIAGNOSIS — M25531 Pain in right wrist: Secondary | ICD-10-CM | POA: Insufficient documentation

## 2018-08-07 DIAGNOSIS — Z79899 Other long term (current) drug therapy: Secondary | ICD-10-CM | POA: Insufficient documentation

## 2018-08-07 DIAGNOSIS — G8929 Other chronic pain: Secondary | ICD-10-CM

## 2018-08-07 DIAGNOSIS — Z79891 Long term (current) use of opiate analgesic: Secondary | ICD-10-CM | POA: Diagnosis not present

## 2018-08-07 DIAGNOSIS — D509 Iron deficiency anemia, unspecified: Secondary | ICD-10-CM | POA: Diagnosis not present

## 2018-08-07 DIAGNOSIS — E034 Atrophy of thyroid (acquired): Secondary | ICD-10-CM | POA: Insufficient documentation

## 2018-08-07 DIAGNOSIS — Z888 Allergy status to other drugs, medicaments and biological substances status: Secondary | ICD-10-CM | POA: Insufficient documentation

## 2018-08-07 DIAGNOSIS — G894 Chronic pain syndrome: Secondary | ICD-10-CM | POA: Diagnosis not present

## 2018-08-07 MED ORDER — HYDROCODONE-ACETAMINOPHEN 10-325 MG PO TABS: 1.0000 | ORAL_TABLET | ORAL | 0 refills | Status: DC | PRN

## 2018-08-07 NOTE — Patient Instructions (Signed)
____________________________________________________________________________________________  Medication Rules  Applies to: All patients receiving prescriptions (written or electronic).  Pharmacy of record: Pharmacy where electronic prescriptions will be sent. If written prescriptions are taken to a different pharmacy, please inform the nursing staff. The pharmacy listed in the electronic medical record should be the one where you would like electronic prescriptions to be sent.  Prescription refills: Only during scheduled appointments. Applies to both, written and electronic prescriptions.  NOTE: The following applies primarily to controlled substances (Opioid* Pain Medications).   Patient's responsibilities: 1. Pain Pills: Bring all pain pills to every appointment (except for procedure appointments). 2. Pill Bottles: Bring pills in original pharmacy bottle. Always bring newest bottle. Bring bottle, even if empty. 3. Medication refills: You are responsible for knowing and keeping track of what medications you need refilled. The day before your appointment, write a list of all prescriptions that need to be refilled. Bring that list to your appointment and give it to the admitting nurse. Prescriptions will be written only during appointments. If you forget a medication, it will not be "Called in", "Faxed", or "electronically sent". You will need to get another appointment to get these prescribed. 4. Prescription Accuracy: You are responsible for carefully inspecting your prescriptions before leaving our office. Have the discharge nurse carefully go over each prescription with you, before taking them home. Make sure that your name is accurately spelled, that your address is correct. Check the name and dose of your medication to make sure it is accurate. Check the number of pills, and the written instructions to make sure they are clear and accurate. Make sure that you are given enough medication to last  until your next medication refill appointment. 5. Taking Medication: Take medication as prescribed. Never take more pills than instructed. Never take medication more frequently than prescribed. Taking less pills or less frequently is permitted and encouraged, when it comes to controlled substances (written prescriptions).  6. Inform other Doctors: Always inform, all of your healthcare providers, of all the medications you take. 7. Pain Medication from other Providers: You are not allowed to accept any additional pain medication from any other Doctor or Healthcare provider. There are two exceptions to this rule. (see below) In the event that you require additional pain medication, you are responsible for notifying us, as stated below. 8. Medication Agreement: You are responsible for carefully reading and following our Medication Agreement. This must be signed before receiving any prescriptions from our practice. Safely store a copy of your signed Agreement. Violations to the Agreement will result in no further prescriptions. (Additional copies of our Medication Agreement are available upon request.) 9. Laws, Rules, & Regulations: All patients are expected to follow all Federal and State Laws, Statutes, Rules, & Regulations. Ignorance of the Laws does not constitute a valid excuse. The use of any illegal substances is prohibited. 10. Adopted CDC guidelines & recommendations: Target dosing levels will be at or below 60 MME/day. Use of benzodiazepines** is not recommended.  Exceptions: There are only two exceptions to the rule of not receiving pain medications from other Healthcare Providers. 1. Exception #1 (Emergencies): In the event of an emergency (i.e.: accident requiring emergency care), you are allowed to receive additional pain medication. However, you are responsible for: As soon as you are able, call our office [phone], at any time of the day or night, and leave a message stating your name, the  date and nature of the emergency, and the name and dose of the medication   prescribed. In the event that your call is answered by a member of our staff, make sure to document and save the date, time, and the name of the person that took your information.  2. Exception #2 (Planned Surgery): In the event that you are scheduled by another doctor or dentist to have any type of surgery or procedure, you are allowed (for a period no longer than 30 days), to receive additional pain medication, for the acute post-op pain. However, in this case, you are responsible for picking up a copy of our "Post-op Pain Management for Surgeons" handout, and giving it to your surgeon or dentist. This document is available at our office, and does not require an appointment to obtain it. Simply go to our office during business hours (Monday-Thursday from 8:00 AM to 4:00 PM) (Friday 8:00 AM to 12:00 Noon) or if you have a scheduled appointment with us, prior to your surgery, and ask for it by name. In addition, you will need to provide us with your name, name of your surgeon, type of surgery, and date of procedure or surgery.  *Opioid medications include: morphine, codeine, oxycodone, oxymorphone, hydrocodone, hydromorphone, meperidine, tramadol, tapentadol, buprenorphine, fentanyl, methadone. **Benzodiazepine medications include: diazepam (Valium), alprazolam (Xanax), clonazepam (Klonopine), lorazepam (Ativan), clorazepate (Tranxene), chlordiazepoxide (Librium), estazolam (Prosom), oxazepam (Serax), temazepam (Restoril), triazolam (Halcion) (Last updated: 11/16/2017) ____________________________________________________________________________________________    

## 2018-08-07 NOTE — Progress Notes (Signed)
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 us, as stated below. 8. Medication Agreement: You are responsible for carefully reading and following our Medication Agreement. This must be signed before receiving any prescriptions from our practice. Safely store a copy of your signed Agreement. Violations to the Agreement will result in no further prescriptions. (Additional copies of our Medication Agreement are available upon request.) 9. Laws, Rules, & Regulations: [facility] patients are expected to follow [place] [employer] and [employer], [employer], Rules, [employer]. Ignorance of the Laws does not constitute a valid excuse. The use of any illegal substances is prohibited. 10. Adopted CDC guidelines & recommendations: Target dosing levels will be at or below 60 MME/day. Use of benzodiazepines** is not recommended.  Exceptions: There  are only two exceptions to the rule of not receiving pain medications from other Healthcare Providers. 1. Exception #1 (Emergencies): In the event of an emergency (i.e.: accident requiring emergency care), you are allowed to receive additional pain medication. However, you are responsible for: As soon as you are able, call our office [phone], at any time of the day or night, and leave a message stating your name, the date and nature of the emergency, and the name and dose of the medication prescribed. In the event that your call is answered by a member of our staff, make sure to document and save the date, time, and the name of the person that took your information.  2. Exception #2 (Planned Surgery): In the event that you are scheduled by another doctor or dentist to have any type of surgery or procedure, you are allowed (for a period no longer than 30 days), to receive additional pain medication, for the acute post-op pain. However, in this case, you are responsible for picking up a copy of our "Post-op Pain Management for Surgeons" handout, and giving it to your surgeon or dentist. This document is available at our office, and does not require an appointment to obtain it. Simply go to our office during business hours (Monday-Thursday from 8:00 AM to 4:00 PM) (Friday 8:00 AM to 12:00 Noon) or if you have a scheduled appointment with [place], prior to your surgery, and ask for it by name. In addition, you will need to provide [place] with your name, name of your surgeon, type of surgery, and date of procedure or surgery.  *Opioid medications include: morphine, codeine, oxycodone, oxymorphone, hydrocodone, hydromorphone, meperidine, tramadol, tapentadol, buprenorphine, fentanyl, methadone. **Benzodiazepine medications include: diazepam (Valium), alprazolam (Xanax), clonazepam (Klonopine), lorazepam (Ativan), clorazepate (  Tranxene), chlordiazepoxide (Librium), estazolam (Prosom), oxazepam (Serax), temazepam  (Restoril), triazolam (Halcion) (Last updated: 11/16/2017) ____________________________________________________________________________________________

## 2018-08-07 NOTE — Progress Notes (Signed)
Nursing Pain Medication Assessment:  Safety precautions to be maintained throughout the outpatient stay will include: orient to surroundings, keep bed in low position, maintain call bell within reach at all times, provide assistance with transfer out of bed and ambulation.  Medication Inspection Compliance: Pill count conducted under aseptic conditions, in front of the patient. Neither the pills nor the bottle was removed from the patient's sight at any time. Once count was completed pills were immediately returned to the patient in their original bottle.  Medication: Hydrocodone/APAP Pill/Patch Count: 100 of 180 pills remain Pill/Patch Appearance: Markings consistent with prescribed medication Bottle Appearance: Standard pharmacy container. Clearly labeled. Filled Date: 7711 / 5 / 2019 Last Medication intake:  Today

## 2018-08-11 LAB — TOXASSURE SELECT 13 (MW), URINE

## 2018-09-24 ENCOUNTER — Other Ambulatory Visit: Payer: Self-pay | Admitting: Internal Medicine

## 2018-09-24 DIAGNOSIS — Z1231 Encounter for screening mammogram for malignant neoplasm of breast: Secondary | ICD-10-CM

## 2018-10-04 ENCOUNTER — Other Ambulatory Visit: Payer: Self-pay | Admitting: Internal Medicine

## 2018-10-04 DIAGNOSIS — F064 Anxiety disorder due to known physiological condition: Secondary | ICD-10-CM

## 2018-10-04 NOTE — Telephone Encounter (Signed)
LVM TO CALL BACK

## 2018-10-09 ENCOUNTER — Ambulatory Visit
Admission: RE | Admit: 2018-10-09 | Discharge: 2018-10-09 | Disposition: A | Discharge status: Home / Self Care | Payer: Commercial Managed Care - PPO | Source: Ambulatory Visit | Attending: Internal Medicine | Admitting: Internal Medicine

## 2018-10-09 DIAGNOSIS — Z1231 Encounter for screening mammogram for malignant neoplasm of breast: Secondary | ICD-10-CM

## 2018-10-10 ENCOUNTER — Other Ambulatory Visit: Payer: Self-pay | Admitting: Internal Medicine

## 2018-10-10 DIAGNOSIS — N6320 Unspecified lump in the left breast, unspecified quadrant: Secondary | ICD-10-CM

## 2018-10-10 DIAGNOSIS — R928 Other abnormal and inconclusive findings on diagnostic imaging of breast: Secondary | ICD-10-CM

## 2018-10-15 ENCOUNTER — Ambulatory Visit: Payer: Commercial Managed Care - PPO | Admitting: Internal Medicine

## 2018-10-15 ENCOUNTER — Encounter: Payer: Self-pay | Admitting: Internal Medicine

## 2018-10-15 VITALS — BP 120/72 | HR 71 | Ht 67.0 in | Wt 148.0 lb

## 2018-10-15 DIAGNOSIS — E034 Atrophy of thyroid (acquired): Secondary | ICD-10-CM | POA: Diagnosis not present

## 2018-10-15 DIAGNOSIS — Z23 Encounter for immunization: Secondary | ICD-10-CM

## 2018-10-15 DIAGNOSIS — D508 Other iron deficiency anemias: Secondary | ICD-10-CM | POA: Diagnosis not present

## 2018-10-15 DIAGNOSIS — F064 Anxiety disorder due to known physiological condition: Secondary | ICD-10-CM | POA: Diagnosis not present

## 2018-10-15 DIAGNOSIS — E782 Mixed hyperlipidemia: Secondary | ICD-10-CM

## 2018-10-15 DIAGNOSIS — S00412A Abrasion of left ear, initial encounter: Secondary | ICD-10-CM

## 2018-10-15 DIAGNOSIS — Z1211 Encounter for screening for malignant neoplasm of colon: Secondary | ICD-10-CM | POA: Diagnosis not present

## 2018-10-15 MED ORDER — LEVOTHYROXINE SODIUM 50 MCG PO TABS: 50.0000 ug | ORAL_TABLET | Freq: Every day | ORAL | 3 refills | Status: DC

## 2018-10-15 MED ORDER — ALPRAZOLAM 0.5 MG PO TABS: 0.5000 mg | ORAL_TABLET | Freq: Two times a day (BID) | ORAL | 5 refills | Status: DC | PRN
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
## 2018-10-15 NOTE — Patient Instructions (Signed)
Schedule nurse visit for Shingrix

## 2018-10-16 LAB — COMPREHENSIVE METABOLIC PANEL
ALT: 18 IU/L (ref 0–32)
AST: 19 IU/L (ref 0–40)
Albumin/Globulin Ratio: 2.8 — ABNORMAL HIGH (ref 1.2–2.2)
Albumin: 4.7 g/dL (ref 3.8–4.9)
Alkaline Phosphatase: 58 IU/L (ref 39–117)
BUN/Creatinine Ratio: 13 (ref 9–23)
BUN: 11 mg/dL (ref 6–24)
Bilirubin Total: 0.5 mg/dL (ref 0.0–1.2)
CO2: 23 mmol/L (ref 20–29)
Calcium: 10.2 mg/dL (ref 8.7–10.2)
Chloride: 102 mmol/L (ref 96–106)
Creatinine, Ser: 0.88 mg/dL (ref 0.57–1.00)
GFR calc non Af Amer: 76 mL/min/{1.73_m2} (ref >59)
GFR, EST AFRICAN AMERICAN: 87 mL/min/{1.73_m2} (ref >59)
Globulin, Total: 1.7 g/dL (ref 1.5–4.5)
Glucose: 83 mg/dL (ref 65–99)
Potassium: 4.2 mmol/L (ref 3.5–5.2)
Sodium: 142 mmol/L (ref 134–144)
TOTAL PROTEIN: 6.4 g/dL (ref 6.0–8.5)

## 2018-10-16 LAB — CBC WITH DIFFERENTIAL/PLATELET
Basophils Absolute: 0.1 10*3/uL (ref 0.0–0.2)
Basos: 2 %
EOS (ABSOLUTE): 0.3 10*3/uL (ref 0.0–0.4)
Eos: 6 %
Hematocrit: 40.8 % (ref 34.0–46.6)
Hemoglobin: 13.6 g/dL (ref 11.1–15.9)
Immature Grans (Abs): 0 10*3/uL (ref 0.0–0.1)
Immature Granulocytes: 0 %
Lymphocytes Absolute: 1.1 10*3/uL (ref 0.7–3.1)
Lymphs: 23 %
MCH: 30.3 pg (ref 26.6–33.0)
MCHC: 33.3 g/dL (ref 31.5–35.7)
MCV: 91 fL (ref 79–97)
Monocytes Absolute: 0.4 10*3/uL (ref 0.1–0.9)
Monocytes: 7 %
Neutrophils Absolute: 3 10*3/uL (ref 1.4–7.0)
Neutrophils: 62 %
PLATELETS: 252 10*3/uL (ref 150–450)
RBC: 4.49 x10E6/uL (ref 3.77–5.28)
RDW: 11.7 % (ref 11.7–15.4)
WBC: 4.9 10*3/uL (ref 3.4–10.8)

## 2018-10-16 LAB — TSH: TSH: 1.62 u[IU]/mL (ref 0.450–4.500)

## 2018-10-16 LAB — LIPID PANEL
Chol/HDL Ratio: 2.2 ratio (ref 0.0–4.4)
Cholesterol, Total: 182 mg/dL (ref 100–199)
HDL: 84 mg/dL (ref >39)
LDL Calculated: 85 mg/dL (ref 0–99)
Triglycerides: 67 mg/dL (ref 0–149)
VLDL Cholesterol Cal: 13 mg/dL (ref 5–40)

## 2018-10-16 NOTE — Progress Notes (Signed)
Called and left VM informing of normal labs. Told to call with any questions.

## 2018-10-19 ENCOUNTER — Ambulatory Visit: Admission: RE | Admit: 2018-10-19 | Payer: Commercial Managed Care - PPO | Source: Ambulatory Visit

## 2018-10-19 ENCOUNTER — Other Ambulatory Visit: Payer: Commercial Managed Care - PPO

## 2018-10-22 ENCOUNTER — Other Ambulatory Visit: Payer: Self-pay | Admitting: Internal Medicine

## 2018-10-22 DIAGNOSIS — F064 Anxiety disorder due to known physiological condition: Secondary | ICD-10-CM

## 2018-10-26 ENCOUNTER — Other Ambulatory Visit: Payer: Self-pay | Admitting: Internal Medicine

## 2018-10-26 DIAGNOSIS — F064 Anxiety disorder due to known physiological condition: Secondary | ICD-10-CM

## 2018-10-30 ENCOUNTER — Ambulatory Visit
Admission: RE | Admit: 2018-10-30 | Discharge: 2018-10-30 | Disposition: A | Discharge status: Home / Self Care | Payer: Commercial Managed Care - PPO | Source: Ambulatory Visit | Attending: Internal Medicine | Admitting: Internal Medicine

## 2018-10-30 DIAGNOSIS — N6320 Unspecified lump in the left breast, unspecified quadrant: Secondary | ICD-10-CM

## 2018-10-30 DIAGNOSIS — R928 Other abnormal and inconclusive findings on diagnostic imaging of breast: Secondary | ICD-10-CM | POA: Diagnosis not present

## 2018-10-30 DIAGNOSIS — R922 Inconclusive mammogram: Secondary | ICD-10-CM | POA: Diagnosis not present

## 2018-10-30 DIAGNOSIS — N6012 Diffuse cystic mastopathy of left breast: Secondary | ICD-10-CM | POA: Diagnosis not present

## 2018-11-01 ENCOUNTER — Other Ambulatory Visit: Payer: Self-pay | Admitting: Nurse Practitioner

## 2018-11-01 ENCOUNTER — Telehealth: Payer: Self-pay | Admitting: *Deleted

## 2018-11-01 ENCOUNTER — Telehealth: Payer: Self-pay | Admitting: Nurse Practitioner

## 2018-11-01 DIAGNOSIS — G894 Chronic pain syndrome: Secondary | ICD-10-CM

## 2018-11-01 MED ORDER — HYDROCODONE-ACETAMINOPHEN 10-325 MG PO TABS: 1.0000 | ORAL_TABLET | ORAL | 0 refills | Status: DC | PRN

## 2018-11-01 NOTE — Telephone Encounter (Signed)
I don't see a script for Hydrocodone for February, the one sent begins 3/ 2020.

## 2018-11-01 NOTE — Telephone Encounter (Signed)
Pt called and stated that the pharmacy never received her February prescription of Norco 10mg . She states the pharmacy had January and march Rx but not feb and needs the rx sent over bc she is out.

## 2018-11-01 NOTE — Telephone Encounter (Signed)
Ok I will sent this in  Thanks

## 2018-11-01 NOTE — Telephone Encounter (Signed)
Patient notified

## 2018-11-05 ENCOUNTER — Other Ambulatory Visit: Payer: Self-pay | Admitting: Internal Medicine

## 2018-11-05 DIAGNOSIS — E034 Atrophy of thyroid (acquired): Secondary | ICD-10-CM

## 2018-11-07 ENCOUNTER — Encounter: Payer: Self-pay | Admitting: Nurse Practitioner

## 2018-11-07 ENCOUNTER — Ambulatory Visit: Payer: Commercial Managed Care - PPO | Attending: Nurse Practitioner | Admitting: Nurse Practitioner

## 2018-11-07 VITALS — BP 131/89 | HR 68 | Temp 97.8°F | Resp 16 | Ht 67.0 in | Wt 148.0 lb

## 2018-11-07 DIAGNOSIS — Z9689 Presence of other specified functional implants: Secondary | ICD-10-CM | POA: Diagnosis not present

## 2018-11-07 DIAGNOSIS — G8929 Other chronic pain: Secondary | ICD-10-CM | POA: Diagnosis present

## 2018-11-07 DIAGNOSIS — G90511 Complex regional pain syndrome I of right upper limb: Secondary | ICD-10-CM | POA: Diagnosis not present

## 2018-11-07 DIAGNOSIS — M25551 Pain in right hip: Secondary | ICD-10-CM | POA: Diagnosis not present

## 2018-11-07 DIAGNOSIS — G894 Chronic pain syndrome: Secondary | ICD-10-CM

## 2018-11-07 DIAGNOSIS — M25511 Pain in right shoulder: Secondary | ICD-10-CM | POA: Diagnosis not present

## 2018-11-07 MED ORDER — HYDROCODONE-ACETAMINOPHEN 10-325 MG PO TABS: 1.0000 | ORAL_TABLET | ORAL | 0 refills | Status: DC | PRN

## 2018-11-07 NOTE — Patient Instructions (Signed)
____________________________________________________________________________________________  Medication Rules  Purpose: To inform patients, and their family members, of our rules and regulations.  Applies to: All patients receiving prescriptions (written or electronic).  Pharmacy of record: Pharmacy where electronic prescriptions will be sent. If written prescriptions are taken to a different pharmacy, please inform the nursing staff. The pharmacy listed in the electronic medical record should be the one where you would like electronic prescriptions to be sent.  Electronic prescriptions: In compliance with the [place] Strengthen Opioid Misuse Prevention (STOP) Act of 2017 (Session Law 2017-74/H243), effective September 19, 2018, all controlled substances must be electronically prescribed. Calling prescriptions to the pharmacy will cease to exist.  Prescription refills: Only during scheduled appointments. Applies to all prescriptions.  NOTE: The following applies primarily to controlled substances (Opioid* Pain Medications).   Patient's responsibilities: 1. Pain Pills: Bring all pain pills to every appointment (except for procedure appointments). 2. Pill Bottles: Bring pills in original pharmacy bottle. Always bring the newest bottle. Bring bottle, even if empty. 3. Medication refills: You are responsible for knowing and keeping track of what medications you take and those you need refilled. The day before your appointment: write a list of all prescriptions that need to be refilled. The day of the appointment: give the list to the admitting nurse. Prescriptions will be written only during appointments. No prescriptions will be written on procedure days. If you forget a medication: it will not be "Called in", "Faxed", or "electronically sent". You will need to get another appointment to get these prescribed. No early refills. Do not call asking to have your prescription filled  early. 4. Prescription Accuracy: You are responsible for carefully inspecting your prescriptions before leaving our office. Have the discharge nurse carefully go over each prescription with you, before taking them home. Make sure that your name is accurately spelled, that your address is correct. Check the name and dose of your medication to make sure it is accurate. Check the number of pills, and the written instructions to make sure they are clear and accurate. Make sure that you are given enough medication to last until your next medication refill appointment. 5. Taking Medication: Take medication as prescribed. When it comes to controlled substances, taking less pills or less frequently than prescribed is permitted and encouraged. Never take more pills than instructed. Never take medication more frequently than prescribed.  6. Inform other Doctors: Always inform, all of your healthcare providers, of all the medications you take. 7. Pain Medication from other Providers: You are not allowed to accept any additional pain medication from any other Doctor or Healthcare provider. There are two exceptions to this rule. (see below) In the event that you require additional pain medication, you are responsible for notifying us, as stated below. 8. Medication Agreement: You are responsible for carefully reading and following our Medication Agreement. This must be signed before receiving any prescriptions from our practice. Safely store a copy of your signed Agreement. Violations to the Agreement will result in no further prescriptions. (Additional copies of our Medication Agreement are available upon request.) 9. Laws, Rules, & Regulations: All patients are expected to follow all Federal and State Laws, Statutes, Rules, & Regulations. Ignorance of the Laws does not constitute a valid excuse. The use of any illegal substances is prohibited. 10. Adopted CDC guidelines & recommendations: Target dosing levels will be  at or below 60 MME/day. Use of benzodiazepines** is not recommended.  Exceptions: There are only two exceptions to the rule of not   receiving pain medications from other Healthcare Providers. 1. Exception #1 (Emergencies): In the event of an emergency (i.e.: accident requiring emergency care), you are allowed to receive additional pain medication. However, you are responsible for: As soon as you are able, call our office [phone], at any time of the day or night, and leave a message stating your name, the date and nature of the emergency, and the name and dose of the medication prescribed. In the event that your call is answered by a member of our staff, make sure to document and save the date, time, and the name of the person that took your information.  2. Exception #2 (Planned Surgery): In the event that you are scheduled by another doctor or dentist to have any type of surgery or procedure, you are allowed (for a period no longer than 30 days), to receive additional pain medication, for the acute post-op pain. However, in this case, you are responsible for picking up a copy of our "Post-op Pain Management for Surgeons" handout, and giving it to your surgeon or dentist. This document is available at our office, and does not require an appointment to obtain it. Simply go to our office during business hours (Monday-Thursday from 8:00 AM to 4:00 PM) (Friday 8:00 AM to 12:00 Noon) or if you have a scheduled appointment with us, prior to your surgery, and ask for it by name. In addition, you will need to provide us with your name, name of your surgeon, type of surgery, and date of procedure or surgery.  *Opioid medications include: morphine, codeine, oxycodone, oxymorphone, hydrocodone, hydromorphone, meperidine, tramadol, tapentadol, buprenorphine, fentanyl, methadone. **Benzodiazepine medications include: diazepam (Valium), alprazolam (Xanax), clonazepam (Klonopine), lorazepam (Ativan), clorazepate  (Tranxene), chlordiazepoxide (Librium), estazolam (Prosom), oxazepam (Serax), temazepam (Restoril), triazolam (Halcion) (Last updated: 11/16/2017) ____________________________________________________________________________________________    
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
## 2018-11-07 NOTE — Progress Notes (Signed)
Nursing Pain Medication Assessment:  Safety precautions to be maintained throughout the outpatient stay will include: orient to surroundings, keep bed in low position, maintain call bell within reach at all times, provide assistance with transfer out of bed and ambulation.  Medication Inspection Compliance: Pill count conducted under aseptic conditions, in front of the patient. Neither the pills nor the bottle was removed from the patient's sight at any time. Once count was completed pills were immediately returned to the patient in their original bottle.  Medication: Hydrocodone/APAP Pill/Patch Count: 148 of 180 pills remain Pill/Patch Appearance: Markings consistent with prescribed medication Bottle Appearance: Standard pharmacy container. Clearly labeled. Filled Date: 02 / 13 / 2020 Last Medication intake:  Today

## 2018-12-17 ENCOUNTER — Other Ambulatory Visit: Payer: Self-pay | Admitting: Internal Medicine

## 2018-12-17 ENCOUNTER — Ambulatory Visit: Payer: Self-pay

## 2018-12-17 DIAGNOSIS — F064 Anxiety disorder due to known physiological condition: Secondary | ICD-10-CM

## 2019-02-04 ENCOUNTER — Encounter: Payer: Self-pay | Admitting: Nurse Practitioner

## 2019-02-05 ENCOUNTER — Ambulatory Visit: Payer: Self-pay | Attending: Nurse Practitioner | Admitting: Nurse Practitioner

## 2019-02-05 ENCOUNTER — Other Ambulatory Visit: Payer: Self-pay

## 2019-02-05 DIAGNOSIS — M545 Low back pain: Secondary | ICD-10-CM

## 2019-02-05 DIAGNOSIS — M25511 Pain in right shoulder: Secondary | ICD-10-CM

## 2019-02-05 DIAGNOSIS — G8929 Other chronic pain: Secondary | ICD-10-CM

## 2019-02-05 DIAGNOSIS — G894 Chronic pain syndrome: Secondary | ICD-10-CM

## 2019-02-05 DIAGNOSIS — G90511 Complex regional pain syndrome I of right upper limb: Secondary | ICD-10-CM

## 2019-02-05 DIAGNOSIS — M25551 Pain in right hip: Secondary | ICD-10-CM

## 2019-02-05 MED ORDER — HYDROCODONE-ACETAMINOPHEN 10-325 MG PO TABS: 1.0000 | ORAL_TABLET | ORAL | 0 refills | Status: DC | PRN

## 2019-02-05 NOTE — Progress Notes (Signed)
Pain Management Encounter Note - Virtual Visit via Telephone Telehealth (real-time audio visits between healthcare provider and patient).  Patient's Phone No. & Preferred Pharmacy:  [phone] (home); [phone] (mobile); (Preferred) [phone]  [facility] [phone] Dan Humphreys, [place] - [address] [place] [address] Phone: [phone] Fax: [phone]  [facility] - [place], [place] - [address] [place] [address] Phone: [phone] Fax: [phone]  [id number] Cheree Ditto, [place] - [address] AT [facility] OF [address] [place] [address] Phone: [phone] Fax: [phone]   Pre-screening note:  Our staff contacted Ms. Prater and offered her an "in person", "face-to-face" appointment versus a telephone encounter. She indicated preferring the telephone encounter, at this time.  Reason for Virtual Visit: COVID-19*  Social distancing based on CDC and AMA recommendations.   I contacted Asani B Sosa on 02/05/2019 at 8:09 AM by telephone and clearly identified myself as Thad Ranger, NP. I verified that I was speaking with the correct person using two identifiers (Name and date of birth: 21-Jun-1966).  Advanced Informed Consent I sought verbal advanced consent from [facility] for telemedicine interactions and virtual visit. I informed Ms. Derousse of the security and privacy concerns, risks, and limitations associated with performing an evaluation and management service by telephone. I also informed Ms. Glace of the availability of "in person" appointments and I informed her of the possibility of a patient responsible charge related to this service. Ms. Scheerer expressed understanding and agreed to proceed.   Historic Elements   [name] is a 53 y.o. year old, female patient evaluated today after her last encounter by our practice on 11/07/2018. Ms.  Bonsell  has a past medical history of Depression, Hypothyroidism, and Thyroid disease. She also  has a past surgical history that includes Spinal cord stimulator implant (2003); Tubal ligation (1990); Fracture surgery (Right); and Breast cyst excision (Left). Ms. Glotfelty has a current medication list which includes the following prescription(s): alprazolam, vitamin d3, medical compression stockings, glucosamine-chondroitin, hydrocodone-acetaminophen, hydrocodone-acetaminophen, hydrocodone-acetaminophen, levothyroxine, multivitamin, naloxone, naproxen sodium, valacyclovir, and venlafaxine xr. She  reports that she has never smoked. She has never used smokeless tobacco. She reports that she does not drink alcohol or use drugs. Ms. Goswick is allergic to phenergan [promethazine hcl].   HPI  I last saw her on 11/07/2018. She is being evaluated for medication management. She has 2/10 low back and left hip pain. She feels like this is sciatic pain. She denies any leg pain this morning. She denies any weakness. She denies any new concerns today.   Pharmacotherapy Assessment  Analgesic:Hydrocodone/APAP 10/325 one every 4 hours (6 per day) (60 mg/day) MME/day:60 mg/day  Monitoring: Pharmacotherapy: No side-effects or adverse reactions reported. [place] PMP: PDMP reviewed during this encounter.       Compliance: No problems identified. Plan: Refer to "POC".  Review of recent tests  US BREAST LTD UNI LEFT INC AXILLA CLINICAL DATA:  Two left breast masses seen on most recent screening mammography.  EXAM: DIGITAL DIAGNOSTIC LEFT MAMMOGRAM WITH CAD AND TOMO  ULTRASOUND LEFT BREAST  COMPARISON:  Previous exam(s).  ACR Breast Density Category c: The breast tissue is heterogeneously dense, which may obscure small masses.  FINDINGS: Additional mammographic views of the left breast demonstrate 2 persistent circumscribed less than a cm rim calcified masses in the subareolar and lower central left  breast,  anterior depth. Both of these masses demonstrate mammographic appearance consistent with rim calcified cysts.  Mammographic images were processed with CAD.  On physical exam, no suspicious masses are palpated.  Targeted ultrasound is performed, showing 2 corresponding rim calcified cysts in the left 6 o'clock breast 2 cm from the nipple measuring 7 mm, and left 12 o'clock subareolar breast, measuring 5 mm. No suspicious masses are seen.  IMPRESSION: Two rim calcified cysts in the left breast correspond to the mammographically seen masses on most recent screening mammography.  No mammographic evidence of malignancy in the left breast.  RECOMMENDATION: Screening mammogram in one year.(Code:SM-B-01Y)  I have discussed the findings and recommendations with the patient. Results were also provided in writing at the conclusion of the visit. If applicable, a reminder letter will be sent to the patient regarding the next appointment.  BI-RADS CATEGORY  2: Benign.  Electronically Signed   By: Ted Mcalpine M.D.   On: 10/30/2018 15:27 MM DIAG BREAST TOMO UNI LEFT CLINICAL DATA:  Two left breast masses seen on most recent screening mammography.  EXAM: DIGITAL DIAGNOSTIC LEFT MAMMOGRAM WITH CAD AND TOMO  ULTRASOUND LEFT BREAST  COMPARISON:  Previous exam(s).  ACR Breast Density Category c: The breast tissue is heterogeneously dense, which may obscure small masses.  FINDINGS: Additional mammographic views of the left breast demonstrate 2 persistent circumscribed less than a cm rim calcified masses in the subareolar and lower central left breast, anterior depth. Both of these masses demonstrate mammographic appearance consistent with rim calcified cysts.  Mammographic images were processed with CAD.  On physical exam, no suspicious masses are palpated.  Targeted ultrasound is performed, showing 2 corresponding rim calcified cysts in the left 6 o'clock breast 2  cm from the nipple measuring 7 mm, and left 12 o'clock subareolar breast, measuring 5 mm. No suspicious masses are seen.  IMPRESSION: Two rim calcified cysts in the left breast correspond to the mammographically seen masses on most recent screening mammography.  No mammographic evidence of malignancy in the left breast.  RECOMMENDATION: Screening mammogram in one year.(Code:SM-B-01Y)  I have discussed the findings and recommendations with the patient. Results were also provided in writing at the conclusion of the visit. If applicable, a reminder letter will be sent to the patient regarding the next appointment.  BI-RADS CATEGORY  2: Benign.  Electronically Signed   By: Ted Mcalpine M.D.   On: 10/30/2018 15:27   Office Visit on 10/15/2018  Component Date Value Ref Range Status  . TSH 10/15/2018 1.620  0.450 - 4.500 uIU/mL Final  . WBC 10/15/2018 4.9  3.4 - 10.8 x10E3/uL Final  . RBC 10/15/2018 4.49  3.77 - 5.28 x10E6/uL Final  . Hemoglobin 10/15/2018 13.6  11.1 - 15.9 g/dL Final  . Hematocrit 40/98/1191 40.8  34.0 - 46.6 % Final  . MCV 10/15/2018 91  79 - 97 fL Final  . MCH 10/15/2018 30.3  26.6 - 33.0 pg Final  . MCHC 10/15/2018 33.3  31.5 - 35.7 g/dL Final  . RDW 47/82/9562 11.7  11.7 - 15.4 % Final  . Platelets 10/15/2018 252  150 - 450 x10E3/uL Final  . Neutrophils 10/15/2018 62  Not Estab. % Final  . Lymphs 10/15/2018 23  Not Estab. % Final  . Monocytes 10/15/2018 7  Not Estab. % Final  . Eos 10/15/2018 6  Not Estab. % Final  . Basos 10/15/2018 2  Not Estab. % Final  . Neutrophils Absolute 10/15/2018 3.0  1.4 - 7.0 x10E3/uL  Final  . Lymphocytes Absolute 10/15/2018 1.1  0.7 - 3.1 x10E3/uL Final  . Monocytes Absolute 10/15/2018 0.4  0.1 - 0.9 x10E3/uL Final  . EOS (ABSOLUTE) 10/15/2018 0.3  0.0 - 0.4 x10E3/uL Final  . Basophils Absolute 10/15/2018 0.1  0.0 - 0.2 x10E3/uL Final  . Immature Granulocytes 10/15/2018 0  Not Estab. % Final  . Immature Grans (Abs)  10/15/2018 0.0  0.0 - 0.1 x10E3/uL Final  . Glucose 10/15/2018 83  65 - 99 mg/dL Final  . BUN 60/45/409801/27/2020 11  6 - 24 mg/dL Final  . Creatinine, Ser 10/15/2018 0.88  0.57 - 1.00 mg/dL Final  . GFR calc non Af Amer 10/15/2018 76  >59 mL/min/1.73 Final  . GFR calc Af Amer 10/15/2018 87  >59 mL/min/1.73 Final  . BUN/Creatinine Ratio 10/15/2018 13  9 - 23 Final  . Sodium 10/15/2018 142  134 - 144 mmol/L Final  . Potassium 10/15/2018 4.2  3.5 - 5.2 mmol/L Final  . Chloride 10/15/2018 102  96 - 106 mmol/L Final  . CO2 10/15/2018 23  20 - 29 mmol/L Final  . Calcium 10/15/2018 10.2  8.7 - 10.2 mg/dL Final  . Total Protein 10/15/2018 6.4  6.0 - 8.5 g/dL Final  . Albumin 11/91/478201/27/2020 4.7  3.8 - 4.9 g/dL Final                 **Please note reference interval change**  . Globulin, Total 10/15/2018 1.7  1.5 - 4.5 g/dL Final  . Albumin/Globulin Ratio 10/15/2018 2.8* 1.2 - 2.2 Final  . Bilirubin Total 10/15/2018 0.5  0.0 - 1.2 mg/dL Final  . Alkaline Phosphatase 10/15/2018 58  39 - 117 IU/L Final  . AST 10/15/2018 19  0 - 40 IU/L Final  . ALT 10/15/2018 18  0 - 32 IU/L Final  . Cholesterol, Total 10/15/2018 182  100 - 199 mg/dL Final  . Triglycerides 10/15/2018 67  0 - 149 mg/dL Final  . HDL 95/62/130801/27/2020 84  >39 mg/dL Final  . VLDL Cholesterol Cal 10/15/2018 13  5 - 40 mg/dL Final  . LDL Calculated 10/15/2018 85  0 - 99 mg/dL Final  . Chol/HDL Ratio 10/15/2018 2.2  0.0 - 4.4 ratio Final   Comment:                                   T. Chol/HDL Ratio                                             Men  Women                               1/2 Avg.Risk  3.4    3.3                                   Avg.Risk  5.0    4.4                                2X Avg.Risk  9.6    7.1  3X Avg.Risk 23.4   11.0    Assessment  The primary encounter diagnosis was Chronic right shoulder pain. Diagnoses of Chronic pain syndrome, Complex regional pain syndrome type 1 of right upper extremity,  Chronic low back pain (Location of Primary Source of Pain) (Bilateral) (R>L), and Chronic hip pain (Location of Secondary source of pain) (Right) were also pertinent to this visit.  Plan of Care  I am having Mariely B. Polka maintain her multivitamin, Vitamin D3, naloxone, naproxen sodium, valACYclovir, Medical Compression Stockings, glucosamine-chondroitin, ALPRAZolam, levothyroxine, venlafaxine XR, HYDROcodone-acetaminophen, HYDROcodone-acetaminophen, and HYDROcodone-acetaminophen.  Pharmacotherapy (Medications Ordered): Meds ordered this encounter  Medications  . HYDROcodone-acetaminophen (NORCO) 10-325 MG tablet    Sig: Take 1 tablet by mouth every 4 (four) hours as needed for up to 30 days for severe pain.    Dispense:  180 tablet    Refill:  0    Do not place this medication, or any other prescription from our practice, on "Automatic Refill". Patient may have prescription filled one day early if pharmacy is closed on scheduled refill date.    Order Specific Question:   Supervising Provider    Answer:   Delano Metz [phone]  . HYDROcodone-acetaminophen (NORCO) 10-325 MG tablet    Sig: Take 1 tablet by mouth every 4 (four) hours as needed for up to 30 days for severe pain.    Dispense:  180 tablet    Refill:  0    Do not place this medication, or any other prescription from our practice, on "Automatic Refill". Patient may have prescription filled one day early if pharmacy is closed on scheduled refill date.    Order Specific Question:   Supervising Provider    Answer:   Delano Metz [phone]  . HYDROcodone-acetaminophen (NORCO) 10-325 MG tablet    Sig: Take 1 tablet by mouth every 4 (four) hours as needed for up to 30 days for severe pain.    Dispense:  180 tablet    Refill:  0    Do not place this medication, or any other prescription from our practice, on "Automatic Refill". Patient may have prescription filled one day early if pharmacy is closed on scheduled refill  date.    Order Specific Question:   Supervising Provider    Answer:   Delano Metz [phone]   Orders:  No orders of the defined types were placed in this encounter.  Follow-up plan:   Return in about 3 months (around 05/08/2019) for MedMgmt.   I discussed the assessment and treatment plan with the patient. The patient was provided an opportunity to ask questions and all were answered. The patient agreed with the plan and demonstrated an understanding of the instructions.  Patient advised to call back or seek an in-person evaluation if the symptoms or condition worsens.  Total duration of non-face-to-face encounter: .  Note by: Thad Ranger, NP Date: 02/05/2019; Time: 8:09 AM  Disclaimer:  * Given the special circumstances of the COVID-19 pandemic, the federal government has announced that the Office for Civil Rights (OCR) will exercise its enforcement discretion and will not impose penalties on physicians using telehealth in the event of noncompliance with regulatory requirements under the [employer] Portability and Accountability Act (HIPAA) in connection with the good faith provision of telehealth during the COVID-19 national public health emergency. (AMA)

## 2019-02-05 NOTE — Patient Instructions (Signed)
____________________________________________________________________________________________  Medication Rules  Purpose: To inform patients, and their family members, of our rules and regulations.  Applies to: All patients receiving prescriptions (written or electronic).  Pharmacy of record: Pharmacy where electronic prescriptions will be sent. If written prescriptions are taken to a different pharmacy, please inform the nursing staff. The pharmacy listed in the electronic medical record should be the one where you would like electronic prescriptions to be sent.  Electronic prescriptions: In compliance with the [place] Strengthen Opioid Misuse Prevention (STOP) Act of 2017 (Session Law 2017-74/H243), effective September 19, 2018, all controlled substances must be electronically prescribed. Calling prescriptions to the pharmacy will cease to exist.  Prescription refills: Only during scheduled appointments. Applies to all prescriptions.  NOTE: The following applies primarily to controlled substances (Opioid* Pain Medications).   Patient's responsibilities: 1. Pain Pills: Bring all pain pills to every appointment (except for procedure appointments). 2. Pill Bottles: Bring pills in original pharmacy bottle. Always bring the newest bottle. Bring bottle, even if empty. 3. Medication refills: You are responsible for knowing and keeping track of what medications you take and those you need refilled. The day before your appointment: write a list of all prescriptions that need to be refilled. The day of the appointment: give the list to the admitting nurse. Prescriptions will be written only during appointments. No prescriptions will be written on procedure days. If you forget a medication: it will not be "Called in", "Faxed", or "electronically sent". You will need to get another appointment to get these prescribed. No early refills. Do not call asking to have your prescription filled  early. 4. Prescription Accuracy: You are responsible for carefully inspecting your prescriptions before leaving our office. Have the discharge nurse carefully go over each prescription with you, before taking them home. Make sure that your name is accurately spelled, that your address is correct. Check the name and dose of your medication to make sure it is accurate. Check the number of pills, and the written instructions to make sure they are clear and accurate. Make sure that you are given enough medication to last until your next medication refill appointment. 5. Taking Medication: Take medication as prescribed. When it comes to controlled substances, taking less pills or less frequently than prescribed is permitted and encouraged. Never take more pills than instructed. Never take medication more frequently than prescribed.  6. Inform other Doctors: Always inform, all of your healthcare providers, of all the medications you take. 7. Pain Medication from other Providers: You are not allowed to accept any additional pain medication from any other Doctor or Healthcare provider. There are two exceptions to this rule. (see below) In the event that you require additional pain medication, you are responsible for notifying us, as stated below. 8. Medication Agreement: You are responsible for carefully reading and following our Medication Agreement. This must be signed before receiving any prescriptions from our practice. Safely store a copy of your signed Agreement. Violations to the Agreement will result in no further prescriptions. (Additional copies of our Medication Agreement are available upon request.) 9. Laws, Rules, & Regulations: All patients are expected to follow all Federal and State Laws, Statutes, Rules, & Regulations. Ignorance of the Laws does not constitute a valid excuse. The use of any illegal substances is prohibited. 10. Adopted CDC guidelines & recommendations: Target dosing levels will be  at or below 60 MME/day. Use of benzodiazepines** is not recommended.  Exceptions: There are only two exceptions to the rule of not   receiving pain medications from other Healthcare Providers. 1. Exception #1 (Emergencies): In the event of an emergency (i.e.: accident requiring emergency care), you are allowed to receive additional pain medication. However, you are responsible for: As soon as you are able, call our office [phone], at any time of the day or night, and leave a message stating your name, the date and nature of the emergency, and the name and dose of the medication prescribed. In the event that your call is answered by a member of our staff, make sure to document and save the date, time, and the name of the person that took your information.  2. Exception #2 (Planned Surgery): In the event that you are scheduled by another doctor or dentist to have any type of surgery or procedure, you are allowed (for a period no longer than 30 days), to receive additional pain medication, for the acute post-op pain. However, in this case, you are responsible for picking up a copy of our "Post-op Pain Management for Surgeons" handout, and giving it to your surgeon or dentist. This document is available at our office, and does not require an appointment to obtain it. Simply go to our office during business hours (Monday-Thursday from 8:00 AM to 4:00 PM) (Friday 8:00 AM to 12:00 Noon) or if you have a scheduled appointment with us, prior to your surgery, and ask for it by name. In addition, you will need to provide us with your name, name of your surgeon, type of surgery, and date of procedure or surgery.  *Opioid medications include: morphine, codeine, oxycodone, oxymorphone, hydrocodone, hydromorphone, meperidine, tramadol, tapentadol, buprenorphine, fentanyl, methadone. **Benzodiazepine medications include: diazepam (Valium), alprazolam (Xanax), clonazepam (Klonopine), lorazepam (Ativan), clorazepate  (Tranxene), chlordiazepoxide (Librium), estazolam (Prosom), oxazepam (Serax), temazepam (Restoril), triazolam (Halcion) (Last updated: 11/16/2017) ____________________________________________________________________________________________    

## 2019-02-21 ENCOUNTER — Other Ambulatory Visit: Payer: Self-pay

## 2019-02-21 ENCOUNTER — Encounter: Payer: Self-pay | Admitting: Internal Medicine

## 2019-02-21 ENCOUNTER — Ambulatory Visit (INDEPENDENT_AMBULATORY_CARE_PROVIDER_SITE_OTHER): Payer: Self-pay | Admitting: Internal Medicine

## 2019-02-21 VITALS — BP 104/68 | HR 92 | Temp 97.7°F | Ht 67.0 in | Wt 150.0 lb

## 2019-02-21 DIAGNOSIS — J020 Streptococcal pharyngitis: Secondary | ICD-10-CM

## 2019-02-21 LAB — POCT RAPID STREP A (OFFICE): Rapid Strep A Screen: POSITIVE — AB

## 2019-02-21 MED ORDER — AMOXICILLIN-POT CLAVULANATE 875-125 MG PO TABS: 1.0000 | ORAL_TABLET | Freq: Two times a day (BID) | ORAL | 0 refills | Status: AC
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
## 2019-04-30 ENCOUNTER — Ambulatory Visit: Payer: Commercial Managed Care - PPO | Admitting: Internal Medicine

## 2019-05-07 ENCOUNTER — Other Ambulatory Visit: Payer: Self-pay

## 2019-05-07 ENCOUNTER — Encounter: Payer: Self-pay | Admitting: Internal Medicine

## 2019-05-07 ENCOUNTER — Encounter: Payer: Self-pay | Admitting: Pain Medicine

## 2019-05-07 ENCOUNTER — Ambulatory Visit (INDEPENDENT_AMBULATORY_CARE_PROVIDER_SITE_OTHER): Payer: Self-pay | Admitting: Internal Medicine

## 2019-05-07 VITALS — BP 106/64 | HR 71 | Ht 67.0 in | Wt 141.0 lb

## 2019-05-07 DIAGNOSIS — E034 Atrophy of thyroid (acquired): Secondary | ICD-10-CM

## 2019-05-07 DIAGNOSIS — B009 Herpesviral infection, unspecified: Secondary | ICD-10-CM

## 2019-05-07 DIAGNOSIS — F064 Anxiety disorder due to known physiological condition: Secondary | ICD-10-CM

## 2019-05-07 DIAGNOSIS — H1132 Conjunctival hemorrhage, left eye: Secondary | ICD-10-CM

## 2019-05-07 MED ORDER — VALACYCLOVIR HCL 1 G PO TABS: 1000.0000 mg | ORAL_TABLET | Freq: Two times a day (BID) | ORAL | 12 refills | Status: DC

## 2019-05-07 MED ORDER — ALPRAZOLAM 0.5 MG PO TABS: 0.5000 mg | ORAL_TABLET | Freq: Two times a day (BID) | ORAL | 5 refills | Status: DC | PRN

## 2019-05-07 MED ORDER — VENLAFAXINE HCL ER 75 MG PO CP24: 75.0000 mg | ORAL_CAPSULE | Freq: Every day | ORAL | 5 refills | Status: DC
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
## 2019-05-07 NOTE — Progress Notes (Signed)
Pain Management Virtual Encounter Note - Virtual Visit via Telephone Telehealth (real-time audio visits between healthcare provider and patient).   Patient's Phone No. & Preferred Pharmacy:  [phone] (home); [phone] (mobile); (Preferred) [phone] [email or web address]  [id number] [place] [id number]: [phone]: [phone]   Pre-screening note:  Our staff contacted Ms. Southern and offered her an "in person", "face-to-face" appointment versus a telephone encounter. She indicated preferring the telephone encounter, at this time.   Reason for Virtual Visit: COVID-19*  Social distancing based on CDC and AMA recommendations.   I contacted Elijah B Pumphrey on 05/08/2019 via telephone.      I clearly identified myself as FGaspar Cola MD. I verified that I was speaking with the correct person using two identifiers (Name: MEMERLY PRAK and date of birth: 121-May-1967.  Advanced Informed Consent I sought verbal advanced consent from MDanvillefor virtual visit interactions. I informed Ms. Borys of possible security and privacy concerns, risks, and limitations associated with providing "not-in-person" medical evaluation and management services. I also informed Ms. Creveling of the availability of "in-person" appointments. Finally, I informed her that there would be a charge for the virtual visit and that she could be  personally, fully or partially, financially responsible for it. Ms. FMumpowerexpressed understanding and agreed to proceed.   Historic Elements   Ms. MLATESHIA SCHMOKERis a 53y.o. year old, female patient evaluated today after her last encounter by our practice on 02/05/2019. Ms. Gladhill  has a past medical history of Depression, Hypothyroidism, and Thyroid disease. She also  has a past surgical history that includes Spinal cord stimulator implant (2003); Tubal ligation  (1990); Fracture surgery (Right); and Breast cyst excision (Left). Ms. FSeabornhas a current medication list which includes the following prescription(s): alprazolam, vitamin d3, medical compression stockings, glucosamine-chondroitin, hydrocodone-acetaminophen, hydrocodone-acetaminophen, hydrocodone-acetaminophen, levothyroxine, multivitamin, narcan, naproxen sodium, valacyclovir, and venlafaxine xr. She  reports that she has never smoked. She has never used smokeless tobacco. She reports that she does not drink alcohol or use drugs. Ms. FSheeranis allergic to phenergan [promethazine hcl].   HPI  Today, she is being contacted for medication management.  Pharmacotherapy Assessment  Analgesic: Hydrocodone/APAP 10/325, 1 tab PO q 4 hrs (60 mg/day of hydrocodone) MME/day:60 mg/day.   Monitoring: Pharmacotherapy: No side-effects or adverse reactions reported. [place] PMP: PDMP reviewed during this encounter.       Compliance: No problems identified. Effectiveness: Clinically acceptable. Plan: Refer to "POC".  UDS:  Summary  Date Value Ref Range Status  08/07/2018 FINAL  Final    Comment:    ==================================================================== TOXASSURE SELECT 13 (MW) ==================================================================== Test                             Result       Flag       Units Drug Present   Alpha-hydroxyalprazolam        35                      ng/mg creat    Alpha-hydroxyalprazolam is an expected metabolite of alprazolam.    Source of alprazolam is a scheduled prescription medication.   Hydrocodone                    1440  ng/mg creat   Hydromorphone                  248                     ng/mg creat   Dihydrocodeine                 120                     ng/mg creat   Norhydrocodone                 >2717                   ng/mg creat    Sources of hydrocodone include scheduled prescription    medications. Hydromorphone,  dihydrocodeine and norhydrocodone are    expected metabolites of hydrocodone. Hydromorphone and    dihydrocodeine are also available as scheduled prescription    medications. ==================================================================== Test                      Result    Flag   Units      Ref Range   Creatinine              184              mg/dL      >=20 ==================================================================== Declared Medications:  Medication list was not provided. ==================================================================== For clinical consultation, please call [phone]. ====================================================================    Laboratory Chemistry Profile (12 mo)  Renal: 10/15/2018: BUN 11; BUN/Creatinine Ratio 13; Creatinine, Ser 0.88  Lab Results  Component Value Date   GFRAA 87 10/15/2018   GFRNONAA 76 10/15/2018   Hepatic: 10/15/2018: Albumin 4.7 Lab Results  Component Value Date   AST 19 10/15/2018   ALT 18 10/15/2018   Other: No results found for requested labs within last 8760 hours. Note: Above Lab results reviewed.  Imaging  Last 90 days:  No results found.  Assessment  The primary encounter diagnosis was Chronic pain syndrome. Diagnoses of Chronic low back pain (Primary Area of Pain) (Bilateral) (R>L), Chronic hip pain (Secondary area of Pain) (Right), Chronic wrist pain (Third area of Pain) (Right) (CRPS), Presence of functional implant (Rechargable Medtronic Neurostimulator) (Cervical Epidural Leads), and Opiate use (60 MME/Day) were also pertinent to this visit.  Plan of Care  I have changed Na B. Reifschneider's naloxone to Narcan. I have also changed her HYDROcodone-acetaminophen. I am also having her start on HYDROcodone-acetaminophen and HYDROcodone-acetaminophen. Additionally, I am having her maintain her multivitamin, Vitamin D3, naproxen sodium, Medical Compression Stockings, glucosamine-chondroitin,  levothyroxine, ALPRAZolam, valACYclovir, and venlafaxine XR.  Pharmacotherapy (Medications Ordered): Meds ordered this encounter  Medications  . HYDROcodone-acetaminophen (NORCO) 10-325 MG tablet    Sig: Take 1 tablet by mouth every 4 (four) hours as needed for severe pain. Must last 30 days    Dispense:  180 tablet    Refill:  0    Chronic Pain: STOP Act (Not applicable) Fill 1 day early if closed on refill date. Do not fill until: 06/01/2019. To last until: 07/01/2019. Avoid benzodiazepines within 8 hours of opioids  . naloxone (NARCAN) 4 MG/0.1ML LIQD nasal spray kit    Sig: Spray into one nostril. Repeat with second device into other nostril after 2-3 minutes if no or minimal response.    Dispense:  2 each    Refill:  0    Narcan Nasal Spray. (  2 pack) Please provide the patient with clear instructions on the use of this device/medication.  Marland Kitchen HYDROcodone-acetaminophen (NORCO) 10-325 MG tablet    Sig: Take 1 tablet by mouth every 4 (four) hours as needed for severe pain. Must last 30 days    Dispense:  180 tablet    Refill:  0    Chronic Pain: STOP Act (Not applicable) Fill 1 day early if closed on refill date. Do not fill until: 07/01/2019. To last until: 07/31/2019. Avoid benzodiazepines within 8 hours of opioids  . HYDROcodone-acetaminophen (NORCO) 10-325 MG tablet    Sig: Take 1 tablet by mouth every 4 (four) hours as needed for severe pain. Must last 30 days    Dispense:  180 tablet    Refill:  0    Chronic Pain: STOP Act (Not applicable) Fill 1 day early if closed on refill date. Do not fill until: 07/31/2019. To last until: 08/30/2019. Avoid benzodiazepines within 8 hours of opioids   Orders:  No orders of the defined types were placed in this encounter.  Follow-up plan:   No follow-ups on file.      Interventional therapies:  Considering:   Diagnostic bilateral lumbar facet block  Diagnostic intra-articular right hip injection    Palliative PRN treatment(s):    Palliative right stellate ganglion block  Management of Cervical spinal cord stimulator     Recent Visits No visits were found meeting these conditions.  Showing recent visits within past 90 days and meeting all other requirements   Today's Visits Date Type Provider Dept  05/08/19 Office Visit Milinda Pointer, MD [facility]  Showing today's visits and meeting all other requirements   Future Appointments No visits were found meeting these conditions.  Showing future appointments within next 90 days and meeting all other requirements   I discussed the assessment and treatment plan with the patient. The patient was provided an opportunity to ask questions and all were answered. The patient agreed with the plan and demonstrated an understanding of the instructions.  Patient advised to call back or seek an in-person evaluation if the symptoms or condition worsens.  Total duration of non-face-to-face encounter: 12 minutes.  Note by: Gaspar Cola, MD Date: 05/08/2019; Time: 8:38 AM  Note: This dictation was prepared with Dragon dictation. Any transcriptional errors that may result from this process are unintentional.  Disclaimer:  * Given the special circumstances of the COVID-19 pandemic, the federal government has announced that the Office for Civil Rights (OCR) will exercise its enforcement discretion and will not impose penalties on physicians using telehealth in the event of noncompliance with regulatory requirements under the [place] (HIPAA) in connection with the good faith provision of telehealth during the WRUEA-54 national public health emergency. ([place])

## 2019-05-08 ENCOUNTER — Other Ambulatory Visit: Payer: Self-pay | Admitting: Internal Medicine

## 2019-05-08 ENCOUNTER — Encounter: Payer: Self-pay | Admitting: Internal Medicine

## 2019-05-08 ENCOUNTER — Ambulatory Visit: Payer: Self-pay | Attending: Pain Medicine | Admitting: Pain Medicine

## 2019-05-08 DIAGNOSIS — Z969 Presence of functional implant, unspecified: Secondary | ICD-10-CM

## 2019-05-08 DIAGNOSIS — F1190 Opioid use, unspecified, uncomplicated: Secondary | ICD-10-CM

## 2019-05-08 DIAGNOSIS — M25551 Pain in right hip: Secondary | ICD-10-CM

## 2019-05-08 DIAGNOSIS — G8929 Other chronic pain: Secondary | ICD-10-CM

## 2019-05-08 DIAGNOSIS — M545 Low back pain: Secondary | ICD-10-CM

## 2019-05-08 DIAGNOSIS — G894 Chronic pain syndrome: Secondary | ICD-10-CM

## 2019-05-08 DIAGNOSIS — M25531 Pain in right wrist: Secondary | ICD-10-CM

## 2019-05-08 DIAGNOSIS — B009 Herpesviral infection, unspecified: Secondary | ICD-10-CM

## 2019-05-08 MED ORDER — NARCAN 4 MG/0.1ML NA LIQD: NASAL | 0 refills | Status: DC

## 2019-05-08 MED ORDER — HYDROCODONE-ACETAMINOPHEN 10-325 MG PO TABS: 1.0000 | ORAL_TABLET | ORAL | 0 refills | Status: DC | PRN

## 2019-05-08 MED ORDER — ACYCLOVIR 5 % EX OINT: 1.0000 "application " | TOPICAL_OINTMENT | CUTANEOUS | 0 refills | Status: AC

## 2019-07-13 IMAGING — MG DIGITAL DIAGNOSTIC UNILATERAL LEFT MAMMOGRAM WITH TOMO AND CAD
8 series · 8 of 24 positions shown · non-contrast
Comparison: Previous exam(s).

CLINICAL DATA: Two left breast masses seen on most recent screening
mammography.

EXAM:
DIGITAL DIAGNOSTIC LEFT MAMMOGRAM WITH CAD AND TOMO
ULTRASOUND LEFT BREAST

[L ML synth-2D]
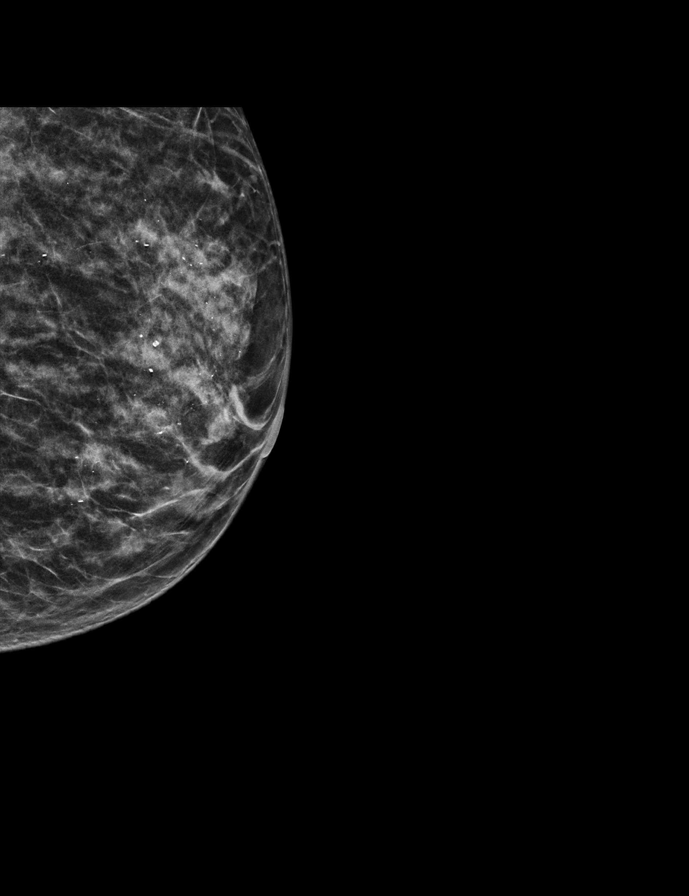

[L MLO synth-2D (1 of 2)]
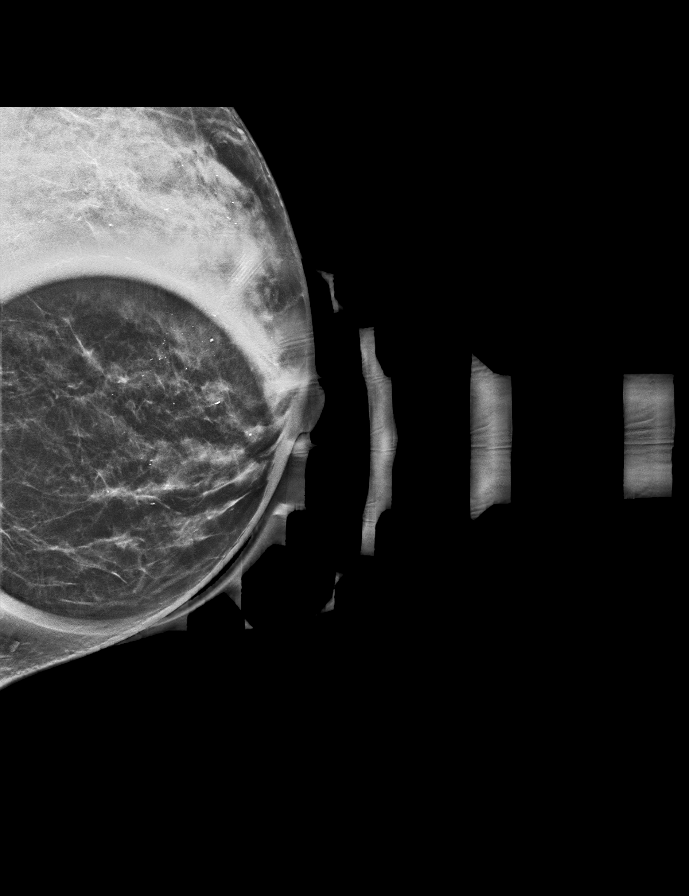

[L MLO synth-2D (2 of 2)]
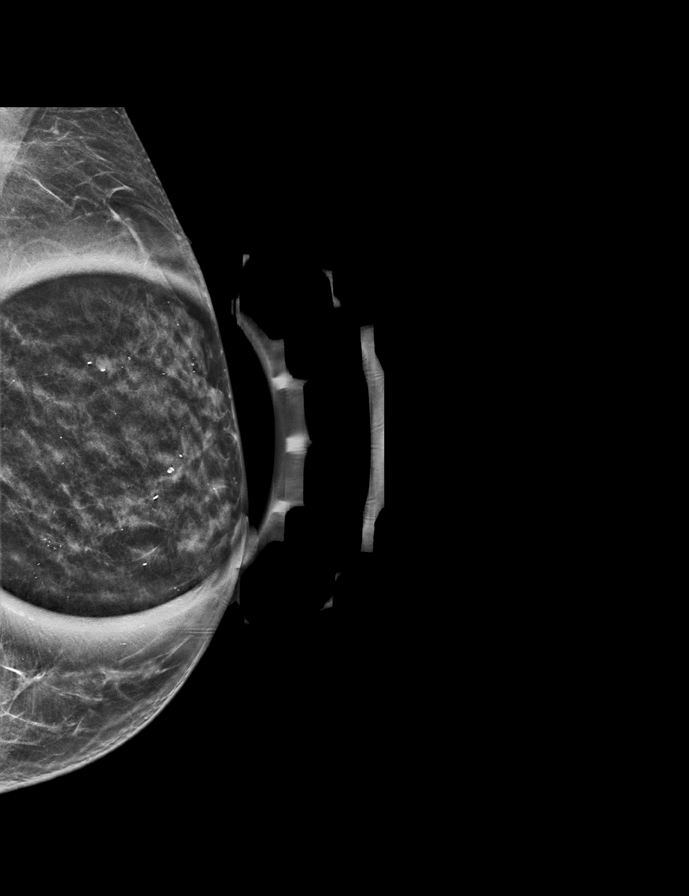

[L CC synth-2D]
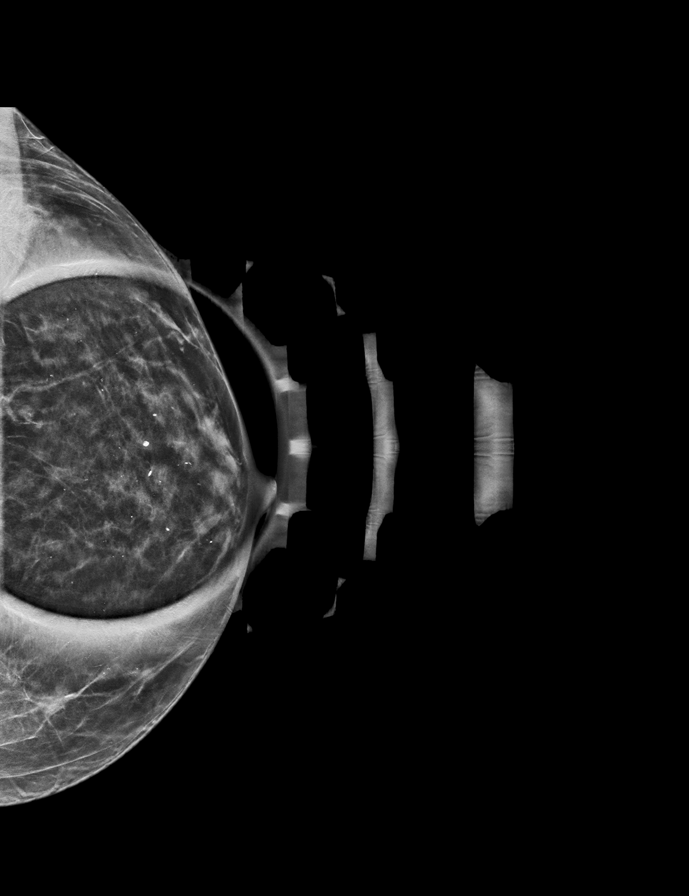

[L ML tomo · tomo slice 21/42.0]
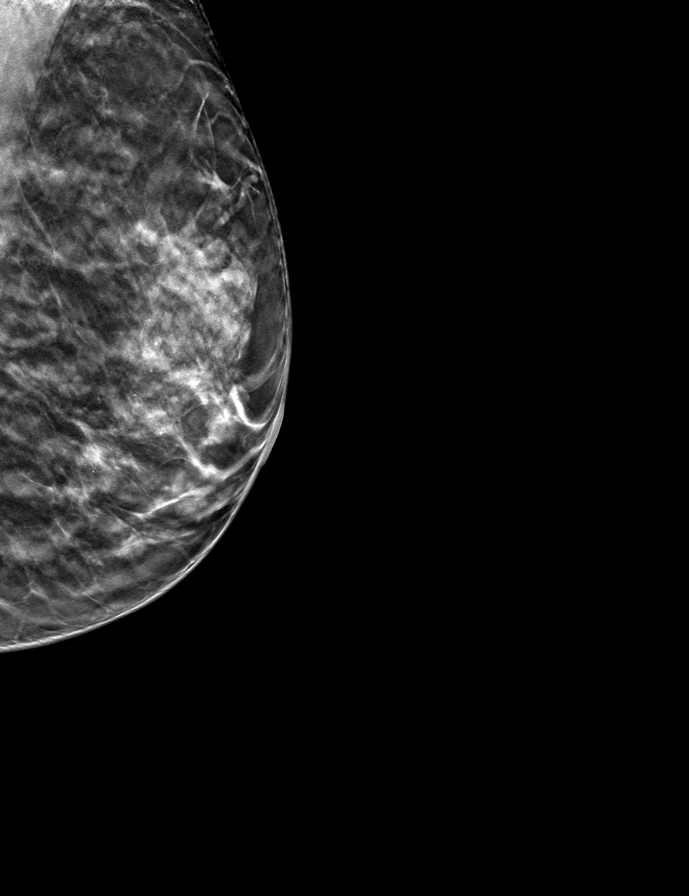

[L CC tomo · tomo slice 23/45.0]
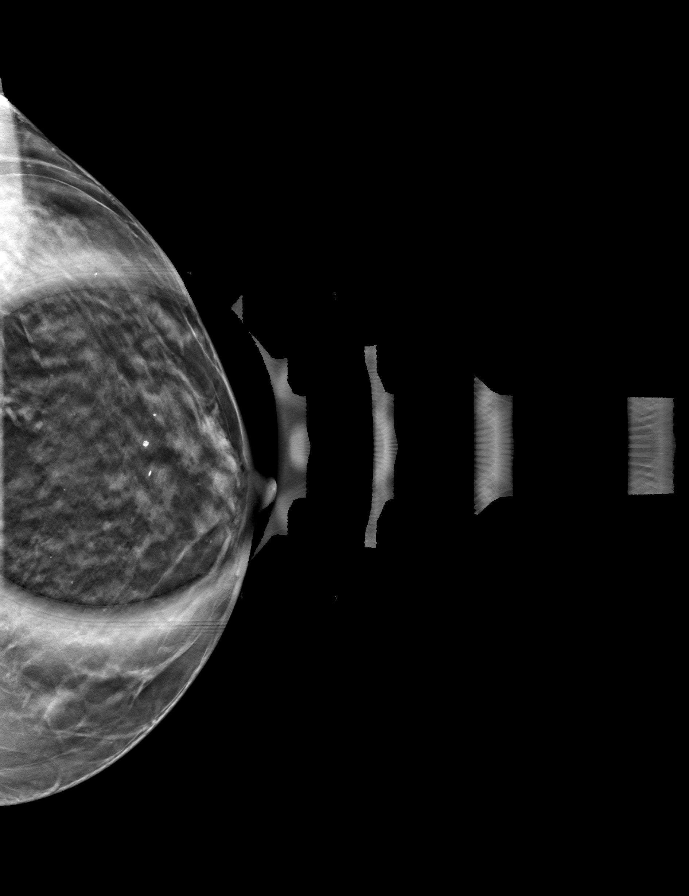

[L MLO tomo (1 of 2) · tomo slice 23/45.0]
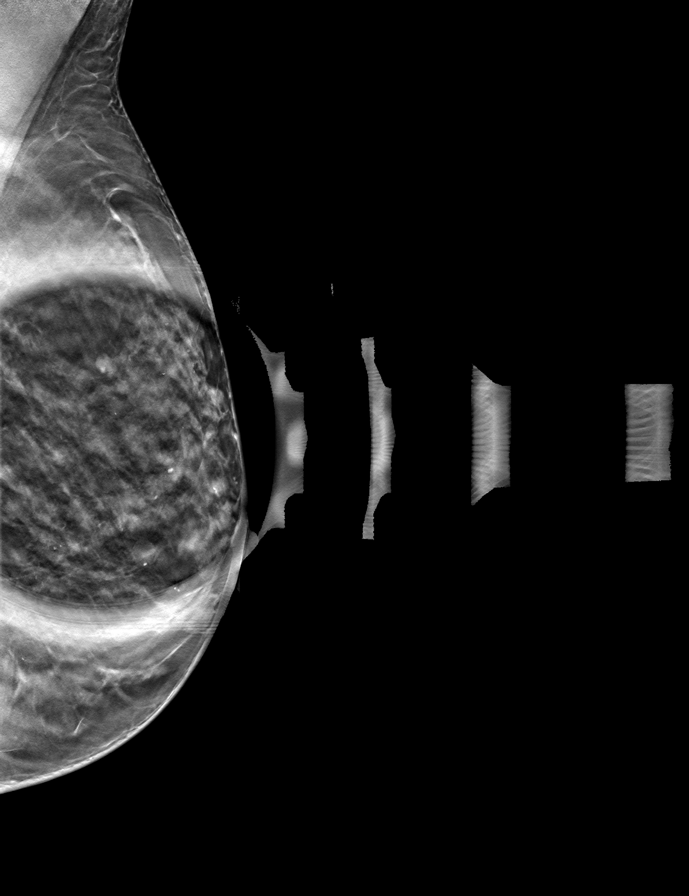

[L MLO tomo (2 of 2) · tomo slice 23/44.0]
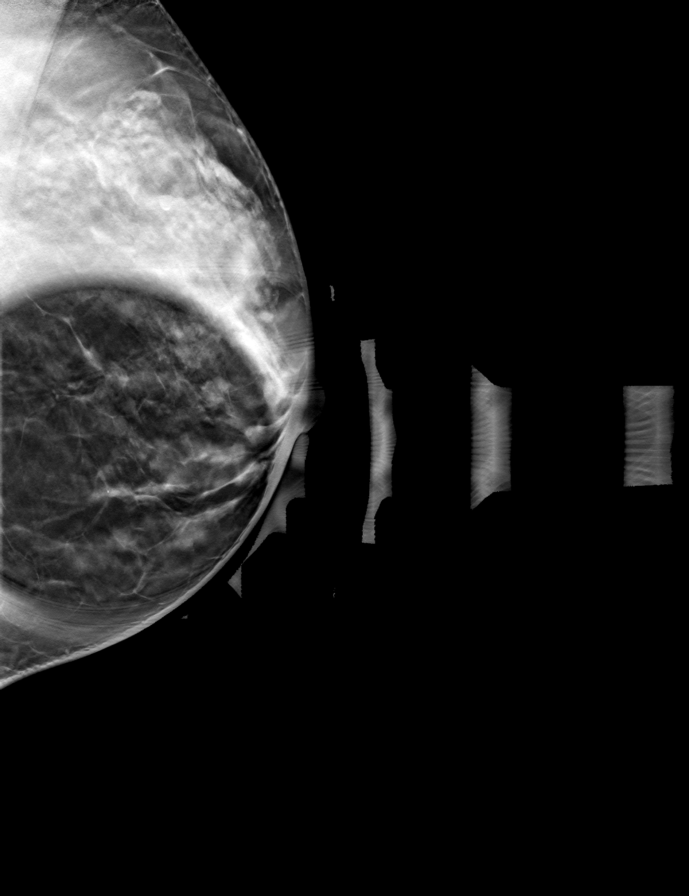

[8 of 24 positions shown; findings below may reference images not displayed]

ACR Breast Density Category c: The breast tissue is heterogeneously
dense, which may obscure small masses.
FINDINGS: Additional mammographic views of the left breast demonstrate 2
persistent circumscribed less than a cm rim calcified masses in the
subareolar and lower central left breast, anterior depth. Both of
these masses demonstrate mammographic appearance consistent with rim
calcified cysts.

Mammographic images were processed with CAD.

On physical exam, no suspicious masses are palpated.

Targeted ultrasound is performed, showing 2 corresponding rim
calcified cysts in the left 6 o'clock breast 2 cm from the nipple
measuring 7 mm, and left 12 o'clock subareolar breast, measuring 5
mm. No suspicious masses are seen.
IMPRESSION: Two rim calcified cysts in the left breast correspond to the
mammographically seen masses on most recent screening mammography.

No mammographic evidence of malignancy in the left breast.

RECOMMENDATION:
Screening mammogram in one year.(Code:PW-3-30V)

I have discussed the findings and recommendations with the patient.
Results were also provided in writing at the conclusion of the
visit. If applicable, a reminder letter will be sent to the patient
regarding the next appointment.

BI-RADS CATEGORY  2: Benign.

## 2019-08-27 ENCOUNTER — Telehealth: Payer: Self-pay | Admitting: *Deleted

## 2019-08-27 NOTE — Progress Notes (Signed)
Pain Management Virtual Encounter Note - Virtual Visit via Telephone Telehealth (real-time audio visits between healthcare provider and patient).   Patient's Phone No. & Preferred Pharmacy:  [phone] (home); [phone] (mobile); (Preferred) [phone] [email or web address]  [facility] [phone] Dan Humphreys, [place] - [address] [place] [address] Phone: [phone] Fax: [phone]    Pre-screening note:  Our staff contacted [name] and offered her an "in person", "face-to-face" appointment versus a telephone encounter. She indicated preferring the telephone encounter, at this time.   Reason for Virtual Visit: COVID-19*  Social distancing based on CDC and AMA recommendations.   I contacted Wendy Snyder on 08/28/2019 via telephone.      I clearly identified myself as Oswaldo Done, MD. I verified that I was speaking with the correct person using two identifiers (Name: Wendy Snyder, and date of birth: 1966/06/21).  Advanced Informed Consent I sought verbal advanced consent from Wendy Snyder for virtual visit interactions. I informed Wendy Snyder of possible security and privacy concerns, risks, and limitations associated with providing "not-in-person" medical evaluation and management services. I also informed Wendy Snyder of the availability of "in-person" appointments. Finally, I informed her that there would be a charge for the virtual visit and that she could be  personally, fully or partially, financially responsible for it. Wendy Snyder expressed understanding and agreed to proceed.   Historic Elements   Wendy Snyder is a 53 y.o. year old, female patient evaluated today after her last encounter by our practice on 05/08/2019. Wendy Snyder  has a past medical history of Depression, Hypothyroidism, and Thyroid disease. She also  has a past surgical history that includes Spinal cord stimulator implant (2003); Tubal ligation  (1990); Fracture surgery (Right); and Breast cyst excision (Left). Wendy Snyder has a current medication list which includes the following prescription(s): acyclovir ointment, alprazolam, vitamin d3, hydrocodone-acetaminophen, hydrocodone-acetaminophen, hydrocodone-acetaminophen, levothyroxine, multivitamin, narcan, naproxen sodium, valacyclovir, and venlafaxine xr. She  reports that she has never smoked. She has never used smokeless tobacco. She reports that she does not drink alcohol or use drugs. Wendy Snyder is allergic to phenergan [promethazine hcl].   HPI  Today, she is being contacted for medication management.  Pharmacotherapy Assessment  Analgesic: Hydrocodone/APAP 10/325, 1 tab PO q 4 hrs (60 mg/day of hydrocodone) MME/day:60 mg/day.   Monitoring: Pharmacotherapy: No side-effects or adverse reactions reported. [place] PMP: PDMP reviewed during this encounter.       Compliance: No problems identified. Effectiveness: Clinically acceptable. Plan: Refer to "POC".  UDS:  Summary  Date Value Ref Range Status  08/07/2018 FINAL  Final    Comment:    ==================================================================== TOXASSURE SELECT 13 (MW) ==================================================================== Test                             Result       Flag       Units Drug Present   Alpha-hydroxyalprazolam        35                      ng/mg creat    Alpha-hydroxyalprazolam is an expected metabolite of alprazolam.    Source of alprazolam is a scheduled prescription medication.   Hydrocodone                    1440  ng/mg creat   Hydromorphone                  248                     ng/mg creat   Dihydrocodeine                 120                     ng/mg creat   Norhydrocodone                 >2717                   ng/mg creat    Sources of hydrocodone include scheduled prescription    medications. Hydromorphone, dihydrocodeine and norhydrocodone are     expected metabolites of hydrocodone. Hydromorphone and    dihydrocodeine are also available as scheduled prescription    medications. ==================================================================== Test                      Result    Flag   Units      Ref Range   Creatinine              184              mg/dL      >=96>=20 ==================================================================== Declared Medications:  Medication list was not provided. ==================================================================== For clinical consultation, please call [phone]. ====================================================================    Laboratory Chemistry Profile (12 mo)  Renal: 10/15/2018: BUN 11; BUN/Creatinine Ratio 13; Creatinine, Ser 0.88  Lab Results  Component Value Date   GFRAA 87 10/15/2018   GFRNONAA 76 10/15/2018   Hepatic: 10/15/2018: Albumin 4.7 Lab Results  Component Value Date   AST 19 10/15/2018   ALT 18 10/15/2018   Other: No results found for requested labs within last 8760 hours. Note: Above Lab results reviewed.  Imaging  US BREAST LTD UNI LEFT INC AXILLA CLINICAL DATA:  Two left breast masses seen on most recent screening mammography.  EXAM: DIGITAL DIAGNOSTIC LEFT MAMMOGRAM WITH CAD AND TOMO  ULTRASOUND LEFT BREAST  COMPARISON:  Previous exam(s).  ACR Breast Density Category c: The breast tissue is heterogeneously dense, which may obscure small masses.  FINDINGS: Additional mammographic views of the left breast demonstrate 2 persistent circumscribed less than a cm rim calcified masses in the subareolar and lower central left breast, anterior depth. Both of these masses demonstrate mammographic appearance consistent with rim calcified cysts.  Mammographic images were processed with CAD.  On physical exam, no suspicious masses are palpated.  Targeted ultrasound is performed, showing 2 corresponding rim calcified cysts in the left 6  o'clock breast 2 cm from the nipple measuring 7 mm, and left 12 o'clock subareolar breast, measuring 5 mm. No suspicious masses are seen.  IMPRESSION: Two rim calcified cysts in the left breast correspond to the mammographically seen masses on most recent screening mammography.  No mammographic evidence of malignancy in the left breast.  RECOMMENDATION: Screening mammogram in one year.(Code:SM-B-01Y)  I have discussed the findings and recommendations with the patient. Results were also provided in writing at the conclusion of the visit. If applicable, a reminder letter will be sent to the patient regarding the next appointment.  BI-RADS CATEGORY  2: Benign.  Electronically Signed   By: Ted Mcalpineobrinka  Dimitrova M.D.   On: 10/30/2018 15:27 MM DIAG BREAST TOMO UNI LEFT CLINICAL DATA:  Two left breast masses seen on most recent screening mammography.  EXAM: DIGITAL DIAGNOSTIC LEFT MAMMOGRAM WITH CAD AND TOMO  ULTRASOUND LEFT BREAST  COMPARISON:  Previous exam(s).  ACR Breast Density Category c: The breast tissue is heterogeneously dense, which may obscure small masses.  FINDINGS: Additional mammographic views of the left breast demonstrate 2 persistent circumscribed less than a cm rim calcified masses in the subareolar and lower central left breast, anterior depth. Both of these masses demonstrate mammographic appearance consistent with rim calcified cysts.  Mammographic images were processed with CAD.  On physical exam, no suspicious masses are palpated.  Targeted ultrasound is performed, showing 2 corresponding rim calcified cysts in the left 6 o'clock breast 2 cm from the nipple measuring 7 mm, and left 12 o'clock subareolar breast, measuring 5 mm. No suspicious masses are seen.  IMPRESSION: Two rim calcified cysts in the left breast correspond to the mammographically seen masses on most recent screening mammography.  No mammographic evidence of malignancy in the  left breast.  RECOMMENDATION: Screening mammogram in one year.(Code:SM-B-01Y)  I have discussed the findings and recommendations with the patient. Results were also provided in writing at the conclusion of the visit. If applicable, a reminder letter will be sent to the patient regarding the next appointment.  BI-RADS CATEGORY  2: Benign.  Electronically Signed   By: Ted Mcalpine M.D.   On: 10/30/2018 15:27   Assessment  The primary encounter diagnosis was Chronic low back pain (Primary Area of Pain) (Bilateral) (R>L). Diagnoses of Chronic pain syndrome and Chronic hip pain (Secondary area of Pain) (Right) were also pertinent to this visit.  Plan of Care  Problem-specific:  No problem-specific Assessment & Plan notes found for this encounter.  I have discontinued Wendy Snyder's Medical Compression Stockings and glucosamine-chondroitin. I am also having her start on HYDROcodone-acetaminophen and HYDROcodone-acetaminophen. Additionally, I am having her maintain her multivitamin, Vitamin D3, naproxen sodium, levothyroxine, ALPRAZolam, valACYclovir, venlafaxine XR, Narcan, acyclovir ointment, and HYDROcodone-acetaminophen.  Pharmacotherapy (Medications Ordered): Meds ordered this encounter  Medications  . HYDROcodone-acetaminophen (NORCO) 10-325 MG tablet    Sig: Take 1 tablet by mouth every 4 (four) hours as needed for severe pain. Must last 30 days    Dispense:  180 tablet    Refill:  0    Chronic Pain: STOP Act (Not applicable) Fill 1 day early if closed on refill date. Do not fill until: 08/30/2019. To last until: 09/29/2019. Avoid benzodiazepines within 8 hours of opioids  . HYDROcodone-acetaminophen (NORCO) 10-325 MG tablet    Sig: Take 1 tablet by mouth every 4 (four) hours as needed for severe pain. Must last 30 days    Dispense:  180 tablet    Refill:  0    Chronic Pain: STOP Act (Not applicable) Fill 1 day early if closed on refill date. Do not fill until:  09/29/2019. To last until: 10/29/2019. Avoid benzodiazepines within 8 hours of opioids  . HYDROcodone-acetaminophen (NORCO) 10-325 MG tablet    Sig: Take 1 tablet by mouth every 4 (four) hours as needed for severe pain. Must last 30 days    Dispense:  180 tablet    Refill:  0    Chronic Pain: STOP Act (Not applicable) Fill 1 day early if closed on refill date. Do not fill until: 10/29/2019. To last until: 11/28/2019. Avoid benzodiazepines within 8 hours of opioids   Orders:  No orders of the defined types were placed in this encounter.  Follow-up plan:  Return in about 13 weeks (around 11/27/2019) for (VV), (MM).      Interventional therapies:  Considering:   Diagnostic bilateral lumbar facet block  Diagnostic intra-articular right hip injection    Palliative PRN treatment(s):   Palliative right stellate ganglion block  Management of Cervical spinal cord stimulator      Recent Visits No visits were found meeting these conditions.  Showing recent visits within past 90 days and meeting all other requirements   Today's Visits Date Type Provider Dept  08/28/19 Telemedicine Milinda Pointer, MD [facility]  Showing today's visits and meeting all other requirements   Future Appointments No visits were found meeting these conditions.  Showing future appointments within next 90 days and meeting all other requirements   I discussed the assessment and treatment plan with the patient. The patient was provided an opportunity to ask questions and all were answered. The patient agreed with the plan and demonstrated an understanding of the instructions.  Patient advised to call back or seek an in-person evaluation if the symptoms or condition worsens.  Total duration of non-face-to-face encounter: 12 minutes.  Note by: Gaspar Cola, MD Date: 08/28/2019; Time: 9:55 AM  Note: This dictation was prepared with Dragon dictation. Any transcriptional errors that may result from this  process are unintentional.  Disclaimer:  * Given the special circumstances of the COVID-19 pandemic, the federal government has announced that the Office for Civil Rights (OCR) will exercise its enforcement discretion and will not impose penalties on physicians using telehealth in the event of noncompliance with regulatory requirements under the [place] (HIPAA) in connection with the good faith provision of telehealth during the HALPF-79 national public health emergency. ([place])

## 2019-08-27 NOTE — Telephone Encounter (Signed)
Attempted to call for pre appointment review of allergies/meds. Message left. 

## 2019-08-28 ENCOUNTER — Ambulatory Visit: Payer: Self-pay | Attending: Pain Medicine | Admitting: Pain Medicine

## 2019-08-28 ENCOUNTER — Other Ambulatory Visit: Payer: Self-pay

## 2019-08-28 ENCOUNTER — Encounter: Payer: Self-pay | Admitting: Pain Medicine

## 2019-08-28 DIAGNOSIS — G894 Chronic pain syndrome: Secondary | ICD-10-CM

## 2019-08-28 DIAGNOSIS — M545 Low back pain: Secondary | ICD-10-CM

## 2019-08-28 DIAGNOSIS — M25551 Pain in right hip: Secondary | ICD-10-CM

## 2019-08-28 DIAGNOSIS — G8929 Other chronic pain: Secondary | ICD-10-CM

## 2019-08-28 MED ORDER — HYDROCODONE-ACETAMINOPHEN 10-325 MG PO TABS: 1.0000 | ORAL_TABLET | ORAL | 0 refills | Status: DC | PRN

## 2019-09-30 ENCOUNTER — Other Ambulatory Visit: Payer: Self-pay | Admitting: Internal Medicine

## 2019-09-30 DIAGNOSIS — F064 Anxiety disorder due to known physiological condition: Secondary | ICD-10-CM

## 2019-10-17 ENCOUNTER — Encounter: Payer: Commercial Managed Care - PPO | Admitting: Internal Medicine

## 2019-10-30 ENCOUNTER — Encounter: Payer: Self-pay | Admitting: Internal Medicine
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
## 2019-11-11 ENCOUNTER — Other Ambulatory Visit: Payer: Self-pay | Admitting: Internal Medicine

## 2019-11-11 DIAGNOSIS — F064 Anxiety disorder due to known physiological condition: Secondary | ICD-10-CM

## 2019-11-25 ENCOUNTER — Other Ambulatory Visit: Payer: Self-pay | Admitting: Internal Medicine

## 2019-11-25 DIAGNOSIS — F064 Anxiety disorder due to known physiological condition: Secondary | ICD-10-CM

## 2019-11-26 ENCOUNTER — Telehealth: Payer: Self-pay

## 2019-11-26 NOTE — Progress Notes (Signed)
Unsuccessful attempt to contact patient for Virtual Visit (Pain Management Telehealth)   Patient provided contact information:  [phone] (home); [phone] (mobile); (Preferred) [phone] [email or web address]   Pre-screening:  Our staff was successful in contacting Ms. Faux using the above provided information.   I unsuccessfully attempted to make contact with Hedi B Wangerin on 3 separate occasions on 11/27/2019 via telephone. I was unable to complete the virtual encounter due to call going directly to voicemail. I was able to leave a message where I clearly identify myself as Oswaldo Done, MD and I left a message to call us back to reschedule the call.  Pharmacotherapy Assessment  Analgesic: Hydrocodone/APAP 10/325, 1 tab PO q 4 hrs (60 mg/day of hydrocodone) MME/day:60 mg/day.    Follow-up plan:   Reschedule Visit.     Interventional therapies:  Considering:   Diagnostic bilateral lumbar facet block  Diagnostic intra-articular right hip injection    Palliative PRN treatment(s):   Palliative right stellate ganglion block  Management of Cervical spinal cord stimulator     Recent Visits No visits were found meeting these conditions.  Showing recent visits within past 90 days and meeting all other requirements   Today's Visits Date Type Provider Dept  11/27/19 Telemedicine Delano Metz, MD [facility]  Showing today's visits and meeting all other requirements   Future Appointments No visits were found meeting these conditions.  Showing future appointments within next 90 days and meeting all other requirements    Note by: Oswaldo Done, MD Date: 11/27/2019; Time: 1:38 PM

## 2019-11-26 NOTE — Telephone Encounter (Signed)
No answer, Left message to cal to get information for VV on Wednesday.

## 2019-11-27 ENCOUNTER — Ambulatory Visit: Payer: Self-pay | Attending: Pain Medicine | Admitting: Pain Medicine

## 2019-11-27 ENCOUNTER — Encounter: Payer: Self-pay | Admitting: Pain Medicine

## 2019-11-27 ENCOUNTER — Other Ambulatory Visit: Payer: Self-pay

## 2019-11-27 DIAGNOSIS — G894 Chronic pain syndrome: Secondary | ICD-10-CM

## 2019-12-09 ENCOUNTER — Telehealth: Payer: Self-pay

## 2019-12-09 ENCOUNTER — Encounter: Payer: Self-pay | Admitting: Internal Medicine

## 2019-12-09 NOTE — Telephone Encounter (Signed)
LM for patient to call us for virtual appointment questions.  

## 2019-12-10 ENCOUNTER — Other Ambulatory Visit: Payer: Self-pay

## 2019-12-10 ENCOUNTER — Ambulatory Visit: Payer: Self-pay | Attending: Pain Medicine | Admitting: Pain Medicine

## 2019-12-10 ENCOUNTER — Encounter: Payer: Self-pay | Admitting: Pain Medicine

## 2019-12-10 VITALS — BP 146/89 | HR 84 | Temp 97.9°F | Resp 16 | Ht 67.0 in | Wt 135.0 lb

## 2019-12-10 DIAGNOSIS — M545 Low back pain: Secondary | ICD-10-CM | POA: Insufficient documentation

## 2019-12-10 DIAGNOSIS — M25531 Pain in right wrist: Secondary | ICD-10-CM | POA: Insufficient documentation

## 2019-12-10 DIAGNOSIS — Z79899 Other long term (current) drug therapy: Secondary | ICD-10-CM | POA: Insufficient documentation

## 2019-12-10 DIAGNOSIS — G894 Chronic pain syndrome: Secondary | ICD-10-CM | POA: Insufficient documentation

## 2019-12-10 DIAGNOSIS — M899 Disorder of bone, unspecified: Secondary | ICD-10-CM | POA: Insufficient documentation

## 2019-12-10 DIAGNOSIS — G8929 Other chronic pain: Secondary | ICD-10-CM | POA: Insufficient documentation

## 2019-12-10 DIAGNOSIS — M25551 Pain in right hip: Secondary | ICD-10-CM | POA: Insufficient documentation

## 2019-12-10 DIAGNOSIS — Z789 Other specified health status: Secondary | ICD-10-CM | POA: Insufficient documentation

## 2019-12-10 MED ORDER — HYDROCODONE-ACETAMINOPHEN 10-325 MG PO TABS: 1.0000 | ORAL_TABLET | ORAL | 0 refills | Status: DC | PRN
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
## 2019-12-10 NOTE — Progress Notes (Signed)
Nursing Pain Medication Assessment:  Safety precautions to be maintained throughout the outpatient stay will include: orient to surroundings, keep bed in low position, maintain call bell within reach at all times, provide assistance with transfer out of bed and ambulation.  Medication Inspection Compliance: Pill count conducted under aseptic conditions, in front of the patient. Neither the pills nor the bottle was removed from the patient's sight at any time. Once count was completed pills were immediately returned to the patient in their original bottle.  Medication: Hydrocodone/APAP Pill/Patch Count: 10 of 180 pills remain Pill/Patch Appearance: Markings consistent with prescribed medication Bottle Appearance: Standard pharmacy container. Clearly labeled. Filled Date:02/22/ 2022 Last Medication intake:  Today

## 2019-12-13 LAB — TOXASSURE SELECT 13 (MW), URINE

## 2019-12-15 LAB — COMP. METABOLIC PANEL (12)
AST: 23 IU/L (ref 0–40)
Albumin/Globulin Ratio: 2.7 — ABNORMAL HIGH (ref 1.2–2.2)
Albumin: 4.8 g/dL (ref 3.8–4.9)
Alkaline Phosphatase: 49 IU/L (ref 39–117)
BUN/Creatinine Ratio: 20 (ref 9–23)
BUN: 17 mg/dL (ref 6–24)
Bilirubin Total: 0.3 mg/dL (ref 0.0–1.2)
Calcium: 9.8 mg/dL (ref 8.7–10.2)
Chloride: 104 mmol/L (ref 96–106)
Creatinine, Ser: 0.87 mg/dL (ref 0.57–1.00)
GFR calc Af Amer: 88 mL/min/{1.73_m2} (ref >59)
GFR calc non Af Amer: 76 mL/min/{1.73_m2} (ref >59)
Globulin, Total: 1.8 g/dL (ref 1.5–4.5)
Glucose: 87 mg/dL (ref 65–99)
Potassium: 4.3 mmol/L (ref 3.5–5.2)
Sodium: 140 mmol/L (ref 134–144)
Total Protein: 6.6 g/dL (ref 6.0–8.5)

## 2019-12-15 LAB — 25-HYDROXY VITAMIN D LCMS D2+D3
25-Hydroxy, Vitamin D-2: <1 ng/mL
25-Hydroxy, Vitamin D-3: 84 ng/mL
25-Hydroxy, Vitamin D: 84 ng/mL

## 2019-12-15 LAB — SEDIMENTATION RATE: Sed Rate: 2 mm/hr (ref 0–40)

## 2019-12-15 LAB — VITAMIN B12: Vitamin B-12: 876 pg/mL (ref 232–1245)

## 2019-12-15 LAB — MAGNESIUM: Magnesium: 2.1 mg/dL (ref 1.6–2.3)

## 2019-12-15 LAB — C-REACTIVE PROTEIN: CRP: <1 mg/L (ref 0–10)

## 2019-12-23 ENCOUNTER — Encounter: Payer: Self-pay | Admitting: Internal Medicine
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
## 2019-12-29 ENCOUNTER — Other Ambulatory Visit: Payer: Self-pay | Admitting: Internal Medicine

## 2019-12-29 DIAGNOSIS — E034 Atrophy of thyroid (acquired): Secondary | ICD-10-CM

## 2020-01-01 ENCOUNTER — Ambulatory Visit (INDEPENDENT_AMBULATORY_CARE_PROVIDER_SITE_OTHER): Payer: PRIVATE HEALTH INSURANCE | Admitting: Internal Medicine

## 2020-01-01 ENCOUNTER — Encounter: Payer: Self-pay | Admitting: Internal Medicine

## 2020-01-01 ENCOUNTER — Other Ambulatory Visit: Payer: Self-pay

## 2020-01-01 VITALS — BP 122/70 | HR 70 | Temp 97.1°F | Ht 67.0 in | Wt 141.0 lb

## 2020-01-01 DIAGNOSIS — E034 Atrophy of thyroid (acquired): Secondary | ICD-10-CM

## 2020-01-01 DIAGNOSIS — F3341 Major depressive disorder, recurrent, in partial remission: Secondary | ICD-10-CM

## 2020-01-01 DIAGNOSIS — Z1211 Encounter for screening for malignant neoplasm of colon: Secondary | ICD-10-CM

## 2020-01-01 DIAGNOSIS — G894 Chronic pain syndrome: Secondary | ICD-10-CM | POA: Diagnosis not present

## 2020-01-01 DIAGNOSIS — Z1231 Encounter for screening mammogram for malignant neoplasm of breast: Secondary | ICD-10-CM | POA: Diagnosis not present

## 2020-01-01 DIAGNOSIS — B009 Herpesviral infection, unspecified: Secondary | ICD-10-CM

## 2020-01-01 DIAGNOSIS — F064 Anxiety disorder due to known physiological condition: Secondary | ICD-10-CM

## 2020-01-01 DIAGNOSIS — Z0000 Encounter for general adult medical examination without abnormal findings: Secondary | ICD-10-CM

## 2020-01-01 LAB — POCT URINALYSIS DIPSTICK
Bilirubin, UA: NEGATIVE
Blood, UA: NEGATIVE
Glucose, UA: NEGATIVE
Ketones, UA: NEGATIVE
Leukocytes, UA: NEGATIVE
Nitrite, UA: NEGATIVE
Protein, UA: NEGATIVE
Spec Grav, UA: 1.01 (ref 1.010–1.025)
Urobilinogen, UA: 0.2 E.U./dL
pH, UA: 6 (ref 5.0–8.0)

## 2020-01-01 MED ORDER — VALACYCLOVIR HCL 1 G PO TABS: 1000.0000 mg | ORAL_TABLET | Freq: Two times a day (BID) | ORAL | 1 refills | Status: DC | PRN

## 2020-01-01 MED ORDER — ALPRAZOLAM 0.5 MG PO TABS: 0.5000 mg | ORAL_TABLET | Freq: Two times a day (BID) | ORAL | 5 refills | Status: DC | PRN
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
## 2020-01-01 NOTE — Patient Instructions (Signed)
Call the [facility] in [place] -  May be part of [place] - to schedule your mammogram which is due now. . Call [phone]

## 2020-01-02 LAB — LIPID PANEL
Chol/HDL Ratio: 2.4 ratio (ref 0.0–4.4)
Cholesterol, Total: 167 mg/dL (ref 100–199)
HDL: 69 mg/dL (ref >39)
LDL Chol Calc (NIH): 78 mg/dL (ref 0–99)
Triglycerides: 115 mg/dL (ref 0–149)
VLDL Cholesterol Cal: 20 mg/dL (ref 5–40)

## 2020-01-02 LAB — CBC WITH DIFFERENTIAL/PLATELET
Basophils Absolute: 0.1 10*3/uL (ref 0.0–0.2)
Basos: 2 %
EOS (ABSOLUTE): 0.3 10*3/uL (ref 0.0–0.4)
Eos: 7 %
Hematocrit: 39.5 % (ref 34.0–46.6)
Hemoglobin: 13.3 g/dL (ref 11.1–15.9)
Immature Grans (Abs): 0 10*3/uL (ref 0.0–0.1)
Immature Granulocytes: 0 %
Lymphocytes Absolute: 1.2 10*3/uL (ref 0.7–3.1)
Lymphs: 26 %
MCH: 30.6 pg (ref 26.6–33.0)
MCHC: 33.7 g/dL (ref 31.5–35.7)
MCV: 91 fL (ref 79–97)
Monocytes Absolute: 0.4 10*3/uL (ref 0.1–0.9)
Monocytes: 8 %
Neutrophils Absolute: 2.6 10*3/uL (ref 1.4–7.0)
Neutrophils: 57 %
Platelets: 275 10*3/uL (ref 150–450)
RBC: 4.34 x10E6/uL (ref 3.77–5.28)
RDW: 12.4 % (ref 11.7–15.4)
WBC: 4.6 10*3/uL (ref 3.4–10.8)

## 2020-01-02 LAB — TSH+FREE T4
Free T4: 1.24 ng/dL (ref 0.82–1.77)
TSH: 2.36 u[IU]/mL (ref 0.450–4.500)

## 2020-01-20 LAB — COLOGUARD: COLOGUARD: POSITIVE — AB

## 2020-01-21 ENCOUNTER — Telehealth: Payer: Self-pay | Admitting: Internal Medicine

## 2020-01-21 NOTE — Telephone Encounter (Signed)
Copied from CRM [id number]. Topic: General - Other >> Jan 21, 2020  3:49 PM Wyonia Hough E wrote: Reason for CRM: Jeannett Senior from [employer] called to verify if abnormal cologard result was received by fax yesterday evening / if not please call [phone]

## 2020-01-23 ENCOUNTER — Telehealth: Payer: Self-pay | Admitting: Internal Medicine

## 2020-01-23 LAB — COLOGUARD: Cologuard: POSITIVE — AB

## 2020-01-23 NOTE — Telephone Encounter (Signed)
Cologuard test is positive.  Will need to have a colonoscopy to further evaluate.

## 2020-01-24 NOTE — Telephone Encounter (Signed)
Called and left a VM askign pt to call the office back about her cologuard.   CM

## 2020-02-24 ENCOUNTER — Telehealth: Payer: Self-pay | Admitting: Pain Medicine

## 2020-03-03 ENCOUNTER — Other Ambulatory Visit: Payer: Self-pay

## 2020-03-03 ENCOUNTER — Encounter: Payer: Self-pay | Admitting: Pain Medicine

## 2020-03-03 ENCOUNTER — Ambulatory Visit: Payer: PRIVATE HEALTH INSURANCE | Attending: Pain Medicine | Admitting: Pain Medicine

## 2020-03-03 VITALS — BP 135/97 | HR 71 | Temp 97.5°F | Resp 16 | Ht 67.0 in | Wt 130.0 lb

## 2020-03-03 DIAGNOSIS — M545 Low back pain: Secondary | ICD-10-CM | POA: Diagnosis not present

## 2020-03-03 DIAGNOSIS — Z79899 Other long term (current) drug therapy: Secondary | ICD-10-CM | POA: Diagnosis not present

## 2020-03-03 DIAGNOSIS — M25551 Pain in right hip: Secondary | ICD-10-CM

## 2020-03-03 DIAGNOSIS — G8929 Other chronic pain: Secondary | ICD-10-CM | POA: Diagnosis present

## 2020-03-03 DIAGNOSIS — G894 Chronic pain syndrome: Secondary | ICD-10-CM | POA: Insufficient documentation

## 2020-03-03 MED ORDER — HYDROCODONE-ACETAMINOPHEN 10-325 MG PO TABS: 1.0000 | ORAL_TABLET | ORAL | 0 refills | Status: DC | PRN

## 2020-03-03 NOTE — Progress Notes (Signed)
Nursing Pain Medication Assessment:  Safety precautions to be maintained throughout the outpatient stay will include: orient to surroundings, keep bed in low position, maintain call bell within reach at all times, provide assistance with transfer out of bed and ambulation.  Medication Inspection Compliance: Pill count conducted under aseptic conditions, in front of the patient. Neither the pills nor the bottle was removed from the patient's sight at any time. Once count was completed pills were immediately returned to the patient in their original bottle.  Medication: Hydrocodone/APAP Pill/Patch Count: 55 of 180 pills remain Pill/Patch Appearance: Markings consistent with prescribed medication Bottle Appearance: Standard pharmacy container. Clearly labeled. Filled Date:02/12/2020  Last Medication intake:  Today

## 2020-03-03 NOTE — Progress Notes (Signed)
PROVIDER NOTE: Information contained herein reflects review and annotations entered in association with encounter. Interpretation of such information and data should be left to medically-trained personnel. Information provided to patient can be located elsewhere in the medical record under "Patient Instructions". Document created using STT-dictation technology, any transcriptional errors that may result from process are unintentional.    Patient: Wendy Snyder  Service Category: E/M  Provider: Gaspar Cola, MD  DOB: [birth year]  DOS: 03/03/2020  Specialty: Interventional Pain Management  MRN: [id number]  Setting: Ambulatory outpatient  PCP: Glean Hess, MD  Type: Established Patient    Referring Provider: Glean Hess, MD  Location: Office  Delivery: Face-to-face     HPI  Reason for encounter: Wendy Snyder, a 54 y.o. year old female, is here today for evaluation and management of her Chronic pain syndrome [G89.4]. Wendy Snyder primary complain today is Back Pain (left, lower) Last encounter: Practice (12/10/2019). My last encounter with her was on 12/10/2019. Pertinent problems: Wendy Snyder has Chronic pain syndrome; Presence of functional implant (Rechargable [employer] Neurostimulator) (Cervical Epidural Leads); CRPS (complex regional pain syndrome), type I, upper (Right); Spinal cord stimulator (cervical leads); Chronic right upper extremity pain; RSD (reflex sympathetic dystrophy) (right upper extremity); Chronic low back pain (Primary Area of Pain) (Bilateral) (R>L); Chronic hip pain (Secondary area of Pain) (Right); Chronic wrist pain (Third area of Pain) (Right) (CRPS); and Chronic right shoulder pain on their pertinent problem list. Pain Assessment: Severity of Chronic pain is reported as a 2 /10. Location: Back Left, Lower/left hip. Onset: More than a month ago. Quality: Aching, Shooting. Timing: Intermittent. Modifying factor(s): medications, not sitting  down. Vitals:  height is 5' 7"  (1.702 m) and weight is 130 lb (59 kg). Her temporal temperature is 97.5 F (36.4 C) (abnormal). Her blood pressure is 135/97 (abnormal) and her pulse is 71. Her respiration is 16 and oxygen saturation is 100%.    The patient indicates doing well with the current medication regimen. No adverse reactions or side effects reported to the medications.   Pharmacotherapy Assessment   Analgesic: Hydrocodone/APAP 10/325, 1 tab PO q 4 hrs (60 mg/day of hydrocodone) MME/day:60 mg/day.   Monitoring: [place] PMP: PDMP not reviewed this encounter.       Pharmacotherapy: No side-effects or adverse reactions reported. Compliance: No problems identified. Effectiveness: Clinically acceptable.  Landis Martins, RN  03/03/2020 11:41 AM  Sign when Signing Visit Nursing Pain Medication Assessment:  Safety precautions to be maintained throughout the outpatient stay will include: orient to surroundings, keep bed in low position, maintain call bell within reach at all times, provide assistance with transfer out of bed and ambulation.  Medication Inspection Compliance: Pill count conducted under aseptic conditions, in front of the patient. Neither the pills nor the bottle was removed from the patient's sight at any time. Once count was completed pills were immediately returned to the patient in their original bottle.  Medication: Hydrocodone/APAP Pill/Patch Count: 55 of 180 pills remain Pill/Patch Appearance: Markings consistent with prescribed medication Bottle Appearance: Standard pharmacy container. Clearly labeled. Filled Date:02/12/2020  Last Medication intake:  Today    UDS:  Summary  Date Value Ref Range Status  12/10/2019 Note  Final    Comment:    ==================================================================== [employer] (MW) ==================================================================== Test                             Result  Flag        Units Drug Present and Declared for Prescription Verification   Hydrocodone                    1777         EXPECTED   ng/mg creat   Hydromorphone                  646          EXPECTED   ng/mg creat   Dihydrocodeine                 261          EXPECTED   ng/mg creat   Norhydrocodone                 >3571        EXPECTED   ng/mg creat    Sources of hydrocodone include scheduled prescription medications.    Hydromorphone, dihydrocodeine and norhydrocodone are expected    metabolites of hydrocodone. Hydromorphone and dihydrocodeine are    also available as scheduled prescription medications. Drug Absent but Declared for Prescription Verification   Alprazolam                     Not Detected UNEXPECTED ng/mg creat ==================================================================== Test                      Result    Flag   Units      Ref Range   Creatinine              140              mg/dL      >=20 ==================================================================== Declared Medications:  The flagging and interpretation on this report are based on the  following declared medications.  Unexpected results may arise from  inaccuracies in the declared medications.  **Note: The testing scope of this panel includes these medications:  Alprazolam  Hydrocodone (Norco)  **Note: The testing scope of this panel does not include the  following reported medications:  Acetaminophen (Norco)  Levothyroxine  Multivitamin  Naloxone  Naproxen  Topical  Valacyclovir (Valtrex)  Venlafaxine  Vitamin D3 ==================================================================== For clinical consultation, please call [phone]. ====================================================================      ROS  Constitutional: Denies any fever or chills Gastrointestinal: No reported hemesis, hematochezia, vomiting, or acute GI distress Musculoskeletal: Denies any acute onset joint swelling, redness,  loss of ROM, or weakness Neurological: No reported episodes of acute onset apraxia, aphasia, dysarthria, agnosia, amnesia, paralysis, loss of coordination, or loss of consciousness  Medication Review  ALPRAZolam, HYDROcodone-acetaminophen, Vitamin D3, acyclovir ointment, levothyroxine, multivitamin, naloxone, naproxen sodium, valACYclovir, and venlafaxine XR  History Review  Allergy: Wendy Snyder is allergic to phenergan [promethazine hcl]. Drug: Wendy Snyder  reports no history of drug use. Alcohol:  reports no history of alcohol use. Tobacco:  reports that she has never smoked. She has never used smokeless tobacco. Social: Wendy Snyder  reports that she has never smoked. She has never used smokeless tobacco. She reports that she does not drink alcohol and does not use drugs. Medical:  has a past medical history of Depression, Hypothyroidism, and Thyroid disease. Surgical: Wendy Snyder  has a past surgical history that includes Spinal cord stimulator implant (2003); Tubal ligation (1990); Fracture surgery (Right); and Breast cyst excision (Left). Family: family history includes Breast cancer in her maternal grandmother; Breast cancer (age of onset: 16)  in her mother; Depression in her mother. She was adopted.  Laboratory Chemistry Profile   Renal Lab Results  Component Value Date   BUN 17 12/10/2019   CREATININE 0.87 12/10/2019   BCR 20 12/10/2019   GFRAA 88 12/10/2019   GFRNONAA 76 12/10/2019     Hepatic Lab Results  Component Value Date   AST 23 12/10/2019   ALT 18 10/15/2018   ALBUMIN 4.8 12/10/2019   ALKPHOS 49 12/10/2019     Electrolytes Lab Results  Component Value Date   NA 140 12/10/2019   K 4.3 12/10/2019   CL 104 12/10/2019   CALCIUM 9.8 12/10/2019   MG 2.1 12/10/2019     Bone Lab Results  Component Value Date   25OHVITD1 84 12/10/2019   25OHVITD2 <1.0 12/10/2019   25OHVITD3 84 12/10/2019     Inflammation (CRP: Acute Phase) (ESR: Chronic Phase) Lab  Results  Component Value Date   CRP <1 12/10/2019   ESRSEDRATE 2 12/10/2019       Note: Above Lab results reviewed.  Recent Imaging Review  US BREAST LTD UNI LEFT INC AXILLA CLINICAL DATA:  Two left breast masses seen on most recent screening mammography.  EXAM: DIGITAL DIAGNOSTIC LEFT MAMMOGRAM WITH CAD AND TOMO  ULTRASOUND LEFT BREAST  COMPARISON:  Previous exam(s).  ACR Breast Density Category c: The breast tissue is heterogeneously dense, which may obscure small masses.  FINDINGS: Additional mammographic views of the left breast demonstrate 2 persistent circumscribed less than a cm rim calcified masses in the subareolar and lower central left breast, anterior depth. Both of these masses demonstrate mammographic appearance consistent with rim calcified cysts.  Mammographic images were processed with CAD.  On physical exam, no suspicious masses are palpated.  Targeted ultrasound is performed, showing 2 corresponding rim calcified cysts in the left 6 o'clock breast 2 cm from the nipple measuring 7 mm, and left 12 o'clock subareolar breast, measuring 5 mm. No suspicious masses are seen.  IMPRESSION: Two rim calcified cysts in the left breast correspond to the mammographically seen masses on most recent screening mammography.  No mammographic evidence of malignancy in the left breast.  RECOMMENDATION: Screening mammogram in one year.(Code:SM-B-01Y)  I have discussed the findings and recommendations with the patient. Results were also provided in writing at the conclusion of the visit. If applicable, a reminder letter will be sent to the patient regarding the next appointment.  BI-RADS CATEGORY  2: Benign.  Electronically Signed   By: Fidela Salisbury M.D.   On: 10/30/2018 15:27 MM DIAG BREAST TOMO UNI LEFT CLINICAL DATA:  Two left breast masses seen on most recent screening mammography.  EXAM: DIGITAL DIAGNOSTIC LEFT MAMMOGRAM WITH CAD AND  TOMO  ULTRASOUND LEFT BREAST  COMPARISON:  Previous exam(s).  ACR Breast Density Category c: The breast tissue is heterogeneously dense, which may obscure small masses.  FINDINGS: Additional mammographic views of the left breast demonstrate 2 persistent circumscribed less than a cm rim calcified masses in the subareolar and lower central left breast, anterior depth. Both of these masses demonstrate mammographic appearance consistent with rim calcified cysts.  Mammographic images were processed with CAD.  On physical exam, no suspicious masses are palpated.  Targeted ultrasound is performed, showing 2 corresponding rim calcified cysts in the left 6 o'clock breast 2 cm from the nipple measuring 7 mm, and left 12 o'clock subareolar breast, measuring 5 mm. No suspicious masses are seen.  IMPRESSION: Two rim calcified cysts in the left breast correspond to  the mammographically seen masses on most recent screening mammography.  No mammographic evidence of malignancy in the left breast.  RECOMMENDATION: Screening mammogram in one year.(Code:SM-B-01Y)  I have discussed the findings and recommendations with the patient. Results were also provided in writing at the conclusion of the visit. If applicable, a reminder letter will be sent to the patient regarding the next appointment.  BI-RADS CATEGORY  2: Benign.  Electronically Signed   By: Fidela Salisbury M.D.   On: 10/30/2018 15:27 Note: Reviewed        Physical Exam  General appearance: Well nourished, well developed, and well hydrated. In no apparent acute distress Mental status: Alert, oriented x 3 (person, place, & time)       Respiratory: No evidence of acute respiratory distress Eyes: PERLA Vitals: BP (!) 135/97   Pulse 71   Temp (!) 97.5 F (36.4 C) (Temporal)   Resp 16   Ht 5' 7"  (1.702 m)   Wt 130 lb (59 kg)   LMP 10/13/2016 (Within Days)   SpO2 100%   BMI 20.36 kg/m  BMI: Estimated body mass index is  20.36 kg/m as calculated from the following:   Height as of this encounter: 5' 7"  (1.702 m).   Weight as of this encounter: 130 lb (59 kg). Ideal: Ideal body weight: 61.6 kg (135 lb 12.9 oz)  Assessment   Status Diagnosis  Controlled Controlled Controlled 1. Chronic pain syndrome   2. Chronic low back pain (Primary Area of Pain) (Bilateral) (R>L)   3. Chronic hip pain (Secondary area of Pain) (Right)   4. Pharmacologic therapy      Updated Problems: No problems updated.  Plan of Care  Problem-specific:  No problem-specific Assessment & Plan notes found for this encounter.  Wendy Snyder has a current medication list which includes the following long-term medication(s): levothyroxine, narcan, venlafaxine xr, [START ON 03/13/2020] hydrocodone-acetaminophen, [START ON 04/12/2020] hydrocodone-acetaminophen, and [START ON 05/12/2020] hydrocodone-acetaminophen.  Pharmacotherapy (Medications Ordered): Meds ordered this encounter  Medications  . HYDROcodone-acetaminophen (NORCO) 10-325 MG tablet    Sig: Take 1 tablet by mouth every 4 (four) hours as needed for severe pain. Must last 30 days    Dispense:  180 tablet    Refill:  0    Chronic Pain: STOP Act (Not applicable) Fill 1 day early if closed on refill date. Do not fill until: 03/13/2020. To last until: 04/12/2020. Avoid benzodiazepines within 8 hours of opioids  . HYDROcodone-acetaminophen (NORCO) 10-325 MG tablet    Sig: Take 1 tablet by mouth every 4 (four) hours as needed for severe pain. Must last 30 days    Dispense:  180 tablet    Refill:  0    Chronic Pain: STOP Act (Not applicable) Fill 1 day early if closed on refill date. Do not fill until: 04/12/2020. To last until: 05/12/2020. Avoid benzodiazepines within 8 hours of opioids  . HYDROcodone-acetaminophen (NORCO) 10-325 MG tablet    Sig: Take 1 tablet by mouth every 4 (four) hours as needed for severe pain. Must last 30 days    Dispense:  180 tablet    Refill:  0     Chronic Pain: STOP Act (Not applicable) Fill 1 day early if closed on refill date. Do not fill until: 05/12/2020. To last until: 06/11/2020. Avoid benzodiazepines within 8 hours of opioids   Orders:  No orders of the defined types were placed in this encounter.  Follow-up plan:   Return in about 3 months (  around 06/10/2020) for (F2F), (MM).      Interventional therapies:  Considering:   Diagnostic bilateral lumbar facet block  Diagnostic IA right hip injection    Palliative PRN treatment(s):   Palliative right stellate ganglion block  Management of Cervical spinal cord stimulator      Recent Visits Date Type Provider Dept  12/10/19 Office Visit Milinda Pointer, MD [facility]  Showing recent visits within past 90 days and meeting all other requirements Today's Visits Date Type Provider Dept  03/03/20 Office Visit Milinda Pointer, MD [facility]  Showing today's visits and meeting all other requirements Future Appointments No visits were found meeting these conditions. Showing future appointments within next 90 days and meeting all other requirements  I discussed the assessment and treatment plan with the patient. The patient was provided an opportunity to ask questions and all were answered. The patient agreed with the plan and demonstrated an understanding of the instructions.  Patient advised to call back or seek an in-person evaluation if the symptoms or condition worsens.  Duration of encounter: 30 minutes.  Note by: Gaspar Cola, MD Date: 03/03/2020; Time: 12:42 PM

## 2020-03-03 NOTE — Patient Instructions (Addendum)

## 2020-04-03 ENCOUNTER — Encounter: Payer: Self-pay | Admitting: Internal Medicine

## 2020-04-17 ENCOUNTER — Encounter: Payer: Self-pay | Admitting: Internal Medicine

## 2020-04-17 ENCOUNTER — Ambulatory Visit: Payer: PRIVATE HEALTH INSURANCE | Admitting: Internal Medicine

## 2020-05-05 ENCOUNTER — Other Ambulatory Visit: Payer: Self-pay | Admitting: Internal Medicine

## 2020-05-05 DIAGNOSIS — F064 Anxiety disorder due to known physiological condition: Secondary | ICD-10-CM

## 2020-05-05 MED ORDER — VENLAFAXINE HCL ER 75 MG PO CP24: 75.0000 mg | ORAL_CAPSULE | Freq: Every day | ORAL | 1 refills | Status: DC

## 2020-05-21 ENCOUNTER — Other Ambulatory Visit: Payer: Self-pay | Admitting: Internal Medicine

## 2020-05-21 DIAGNOSIS — E034 Atrophy of thyroid (acquired): Secondary | ICD-10-CM

## 2020-06-02 ENCOUNTER — Telehealth: Payer: PRIVATE HEALTH INSURANCE | Admitting: Pain Medicine

## 2020-06-10 ENCOUNTER — Other Ambulatory Visit: Payer: Self-pay

## 2020-06-10 ENCOUNTER — Encounter: Payer: Self-pay | Admitting: Student in an Organized Health Care Education/Training Program

## 2020-06-10 ENCOUNTER — Ambulatory Visit: Payer: PRIVATE HEALTH INSURANCE | Admitting: Pain Medicine

## 2020-06-10 ENCOUNTER — Ambulatory Visit
Payer: PRIVATE HEALTH INSURANCE | Attending: Pain Medicine | Admitting: Student in an Organized Health Care Education/Training Program

## 2020-06-10 VITALS — BP 147/94 | HR 70 | Temp 97.2°F | Resp 18 | Ht 67.0 in | Wt 135.0 lb

## 2020-06-10 DIAGNOSIS — G8929 Other chronic pain: Secondary | ICD-10-CM

## 2020-06-10 DIAGNOSIS — G894 Chronic pain syndrome: Secondary | ICD-10-CM | POA: Diagnosis present

## 2020-06-10 DIAGNOSIS — G90511 Complex regional pain syndrome I of right upper limb: Secondary | ICD-10-CM

## 2020-06-10 DIAGNOSIS — Z9689 Presence of other specified functional implants: Secondary | ICD-10-CM

## 2020-06-10 DIAGNOSIS — M545 Low back pain, unspecified: Secondary | ICD-10-CM

## 2020-06-10 DIAGNOSIS — M25511 Pain in right shoulder: Secondary | ICD-10-CM | POA: Insufficient documentation

## 2020-06-10 MED ORDER — HYDROCODONE-ACETAMINOPHEN 10-325 MG PO TABS: 1.0000 | ORAL_TABLET | ORAL | 0 refills | Status: DC | PRN

## 2020-06-10 NOTE — Progress Notes (Signed)
Nursing Pain Medication Assessment:  Safety precautions to be maintained throughout the outpatient stay will include: orient to surroundings, keep bed in low position, maintain call bell within reach at all times, provide assistance with transfer out of bed and ambulation.  Medication Inspection Compliance: Wendy Snyder did not comply with our request to bring her pills to be counted. She was reminded that bringing the medication bottles, even when empty, is a requirement.  Medication: Hydrocodone/APAP Pill/Patch Count: none able to count Pill/Patch Appearance: no pills brought in Bottle Appearance: No container available. Did not bring bottle(s) to appointment. Filled Date: ? / ? / 2021 Last Medication intake:  Today

## 2020-06-10 NOTE — Progress Notes (Signed)
PROVIDER NOTE: Information contained herein reflects review and annotations entered in association with encounter. Interpretation of such information and data should be left to medically-trained personnel. Information provided to patient can be located elsewhere in the medical record under "Patient Instructions". Document created using STT-dictation technology, any transcriptional errors that may result from process are unintentional.    Patient: Wendy Snyder  Service Category: E/M  Provider: Gillis Santa, MD  DOB: Nov 18, 1965  DOS: 06/10/2020  Specialty: Interventional Pain Management  MRN: [id number]  Setting: Ambulatory outpatient  PCP: Glean Hess, MD  Type: Established Patient    Referring Provider: Glean Hess, MD  Location: Office  Delivery: Face-to-face     HPI  Reason for encounter: Wendy Snyder, a 54 y.o. year old female, is here today for evaluation and management of her Chronic bilateral low back pain without sciatica [M54.5, G89.29]. Wendy Snyder primary complain today is Back Pain (low) and Pain (leftsciatic) Last encounter: Practice (03/03/2020). My last encounter with her was on Visit date not found. Pertinent problems: Wendy Snyder has Chronic pain syndrome; Long term current use of opiate analgesic; Long term prescription opiate use; Opiate use (60 MME/Day); Presence of functional implant (Rechargable Medtronic Neurostimulator) (Cervical Epidural Leads); CRPS (complex regional pain syndrome), type I, upper (Right); Spinal cord stimulator (cervical leads); Chronic right upper extremity pain; and RSD (reflex sympathetic dystrophy) (right upper extremity) on their pertinent problem list. Pain Assessment: Severity of Chronic pain is reported as a 2 /10. Location: Back Lower/left sciatic. Onset: More than a month ago. Quality: Hervey Ard, Aching. Timing: Intermittent. Modifying factor(s): moving, medications, walking. Vitals:  height is 5' 7"  (1.702 m) and weight is  135 lb (61.2 kg). Her temporal temperature is 97.2 F (36.2 C) (abnormal). Her blood pressure is 147/94 (abnormal) and her pulse is 70. Her respiration is 18 and oxygen saturation is 100%.   No change in medical history since last visit.  Patient's pain is at baseline.  Patient continues multimodal pain regimen as prescribed.  States that it provides pain relief and improvement in functional status.  Pharmacotherapy Assessment   05/12/2020  1   03/03/2020  Hydrocodone-Acetamin 10-325 MG  180.00  30 Fr Nav   [id number]   Nor (2372)   0/0  60.00 MME  Private Pay   [place]     Analgesic: Norco 10 mg q4 hrs prn #180/month   Monitoring: [place] PMP: PDMP not reviewed this encounter.       Pharmacotherapy: No side-effects or adverse reactions reported. Compliance: No problems identified. Effectiveness: Clinically acceptable.  Hart Rochester, RN  06/10/2020  8:37 AM  Sign when Signing Visit Nursing Pain Medication Assessment:  Safety precautions to be maintained throughout the outpatient stay will include: orient to surroundings, keep bed in low position, maintain call bell within reach at all times, provide assistance with transfer out of bed and ambulation.  Medication Inspection Compliance: Wendy Snyder did not comply with our request to bring her pills to be counted. She was reminded that bringing the medication bottles, even when empty, is a requirement.  Medication: Hydrocodone/APAP Pill/Patch Count: none able to count Pill/Patch Appearance: no pills brought in Bottle Appearance: No container available. Did not bring bottle(s) to appointment. Filled Date: ? / ? / 2021 Last Medication intake:  Today    UDS:  Summary  Date Value Ref Range Status  12/10/2019 Note  Final    Comment:    ==================================================================== [employer] [facility] (MW) ==================================================================== Test  Result        Flag       Units Drug Present and Declared for Prescription Verification   Hydrocodone                    1777         EXPECTED   ng/mg creat   Hydromorphone                  646          EXPECTED   ng/mg creat   Dihydrocodeine                 261          EXPECTED   ng/mg creat   Norhydrocodone                 >3571        EXPECTED   ng/mg creat    Sources of hydrocodone include scheduled prescription medications.    Hydromorphone, dihydrocodeine and norhydrocodone are expected    metabolites of hydrocodone. Hydromorphone and dihydrocodeine are    also available as scheduled prescription medications. Drug Absent but Declared for Prescription Verification   Alprazolam                     Not Detected UNEXPECTED ng/mg creat ==================================================================== Test                      Result    Flag   Units      Ref Range   Creatinine              140              mg/dL      >=20 ==================================================================== Declared Medications:  The flagging and interpretation on this report are based on the  following declared medications.  Unexpected results may arise from  inaccuracies in the declared medications.  **Note: The testing scope of this panel includes these medications:  Alprazolam  Hydrocodone (Norco)  **Note: The testing scope of this panel does not include the  following reported medications:  Acetaminophen (Norco)  Levothyroxine  Multivitamin  Naloxone  Naproxen  Topical  Valacyclovir (Valtrex)  Venlafaxine  Vitamin D3 ==================================================================== For clinical consultation, please call [phone]. ====================================================================      ROS  Constitutional: Denies any fever or chills Gastrointestinal: No reported hemesis, hematochezia, vomiting, or acute GI distress Musculoskeletal: Denies any acute onset joint swelling,  redness, loss of ROM, or weakness Neurological: No reported episodes of acute onset apraxia, aphasia, dysarthria, agnosia, amnesia, paralysis, loss of coordination, or loss of consciousness  Medication Review  ALPRAZolam, HYDROcodone-acetaminophen, Vitamin D3, acyclovir ointment, levothyroxine, multivitamin, naloxone, naproxen sodium, valACYclovir, and venlafaxine XR  History Review  Allergy: Wendy Snyder is allergic to phenergan [promethazine hcl]. Drug: Wendy Snyder  reports no history of drug use. Alcohol:  reports no history of alcohol use. Tobacco:  reports that she has never smoked. She has never used smokeless tobacco. Social: Wendy Snyder  reports that she has never smoked. She has never used smokeless tobacco. She reports that she does not drink alcohol and does not use drugs. Medical:  has a past medical history of Depression, Hypothyroidism, and Thyroid disease. Surgical: Wendy Snyder  has a past surgical history that includes Spinal cord stimulator implant (2003); Tubal ligation (1990); Fracture surgery (Right); and Breast cyst excision (Left). Family: family history includes Breast cancer in her maternal  grandmother; Breast cancer (age of onset: 88) in her mother; Depression in her mother. She was adopted.  Laboratory Chemistry Profile   Renal Lab Results  Component Value Date   BUN 17 12/10/2019   CREATININE 0.87 12/10/2019   BCR 20 12/10/2019   GFRAA 88 12/10/2019   GFRNONAA 76 12/10/2019     Hepatic Lab Results  Component Value Date   AST 23 12/10/2019   ALT 18 10/15/2018   ALBUMIN 4.8 12/10/2019   ALKPHOS 49 12/10/2019     Electrolytes Lab Results  Component Value Date   NA 140 12/10/2019   K 4.3 12/10/2019   CL 104 12/10/2019   CALCIUM 9.8 12/10/2019   MG 2.1 12/10/2019     Bone Lab Results  Component Value Date   25OHVITD1 84 12/10/2019   25OHVITD2 <1.0 12/10/2019   25OHVITD3 84 12/10/2019     Inflammation (CRP: Acute Phase) (ESR: Chronic  Phase) Lab Results  Component Value Date   CRP <1 12/10/2019   ESRSEDRATE 2 12/10/2019       Note: Above Lab results reviewed.  Recent Imaging Review  US BREAST LTD UNI LEFT INC AXILLA CLINICAL DATA:  Two left breast masses seen on most recent screening mammography.  EXAM: DIGITAL DIAGNOSTIC LEFT MAMMOGRAM WITH CAD AND TOMO  ULTRASOUND LEFT BREAST  COMPARISON:  Previous exam(s).  ACR Breast Density Category c: The breast tissue is heterogeneously dense, which may obscure small masses.  FINDINGS: Additional mammographic views of the left breast demonstrate 2 persistent circumscribed less than a cm rim calcified masses in the subareolar and lower central left breast, anterior depth. Both of these masses demonstrate mammographic appearance consistent with rim calcified cysts.  Mammographic images were processed with CAD.  On physical exam, no suspicious masses are palpated.  Targeted ultrasound is performed, showing 2 corresponding rim calcified cysts in the left 6 o'clock breast 2 cm from the nipple measuring 7 mm, and left 12 o'clock subareolar breast, measuring 5 mm. No suspicious masses are seen.  IMPRESSION: Two rim calcified cysts in the left breast correspond to the mammographically seen masses on most recent screening mammography.  No mammographic evidence of malignancy in the left breast.  RECOMMENDATION: Screening mammogram in one year.(Code:SM-B-01Y)  I have discussed the findings and recommendations with the patient. Results were also provided in writing at the conclusion of the visit. If applicable, a reminder letter will be sent to the patient regarding the next appointment.  BI-RADS CATEGORY  2: Benign.  Electronically Signed   By: Fidela Salisbury M.D.   On: 10/30/2018 15:27 MM DIAG BREAST TOMO UNI LEFT CLINICAL DATA:  Two left breast masses seen on most recent screening mammography.  EXAM: DIGITAL DIAGNOSTIC LEFT MAMMOGRAM WITH CAD AND  TOMO  ULTRASOUND LEFT BREAST  COMPARISON:  Previous exam(s).  ACR Breast Density Category c: The breast tissue is heterogeneously dense, which may obscure small masses.  FINDINGS: Additional mammographic views of the left breast demonstrate 2 persistent circumscribed less than a cm rim calcified masses in the subareolar and lower central left breast, anterior depth. Both of these masses demonstrate mammographic appearance consistent with rim calcified cysts.  Mammographic images were processed with CAD.  On physical exam, no suspicious masses are palpated.  Targeted ultrasound is performed, showing 2 corresponding rim calcified cysts in the left 6 o'clock breast 2 cm from the nipple measuring 7 mm, and left 12 o'clock subareolar breast, measuring 5 mm. No suspicious masses are seen.  IMPRESSION: Two rim calcified  cysts in the left breast correspond to the mammographically seen masses on most recent screening mammography.  No mammographic evidence of malignancy in the left breast.  RECOMMENDATION: Screening mammogram in one year.(Code:SM-B-01Y)  I have discussed the findings and recommendations with the patient. Results were also provided in writing at the conclusion of the visit. If applicable, a reminder letter will be sent to the patient regarding the next appointment.  BI-RADS CATEGORY  2: Benign.  Electronically Signed   By: Fidela Salisbury M.D.   On: 10/30/2018 15:27 Note: Reviewed        Physical Exam  General appearance: Well nourished, well developed, and well hydrated. In no apparent acute distress Mental status: Alert, oriented x 3 (person, place, & time)       Respiratory: No evidence of acute respiratory distress Eyes: PERLA Vitals: BP (!) 147/94   Pulse 70   Temp (!) 97.2 F (36.2 C) (Temporal)   Resp 18   Ht 5' 7"  (1.702 m)   Wt 135 lb (61.2 kg)   LMP 10/13/2016 (Within Days)   SpO2 100%   BMI 21.14 kg/m  BMI: Estimated body mass index  is 21.14 kg/m as calculated from the following:   Height as of this encounter: 5' 7"  (1.702 m).   Weight as of this encounter: 135 lb (61.2 kg). Ideal: Ideal body weight: 61.6 kg (135 lb 12.9 oz)   Upper Extremity (UE) Exam    Side: Right upper extremity  Side: Left upper extremity  Skin & Extremity Inspection: Positive color changes  Skin & Extremity Inspection: Skin color, temperature, and hair growth are WNL. No peripheral edema or cyanosis. No masses, redness, swelling, asymmetry, or associated skin lesions. No contractures.  Functional ROM: Pain restricted ROM for all joints of upper extremity  Functional ROM: Unrestricted ROM          Muscle Tone/Strength: Functionally intact. No obvious neuro-muscular anomalies detected.  Muscle Tone/Strength: Functionally intact. No obvious neuro-muscular anomalies detected.  Sensory (Neurological): Neurogenic pain pattern          Sensory (Neurological): Unimpaired          Palpation: No palpable anomalies              Palpation: No palpable anomalies              Provocative Test(s):  Phalen's test: deferred Tinel's test: deferred Apley's scratch test (touch opposite shoulder):  Action 1 (Across chest): deferred Action 2 (Overhead): deferred Action 3 (LB reach): deferred   Provocative Test(s):  Phalen's test: deferred Tinel's test: deferred Apley's scratch test (touch opposite shoulder):  Action 1 (Across chest): deferred Action 2 (Overhead): deferred Action 3 (LB reach): deferred    Lumbar Spine Area Exam  Skin & Axial Inspection: No masses, redness, or swelling Alignment: Symmetrical Functional ROM: Decreased ROM affecting both sides Stability: No instability detected Muscle Tone/Strength: Functionally intact. No obvious neuro-muscular anomalies detected. Sensory (Neurological): Musculoskeletal pain pattern  Gait & Posture Assessment  Ambulation: Unassisted Gait: Relatively normal for age and body habitus Posture: WNL    Assessment   Status Diagnosis  Controlled Controlled Controlled 1. Chronic low back pain (Primary Area of Pain) (Bilateral) (R>L)   2. Chronic pain syndrome   3. Complex regional pain syndrome type 1 of right upper extremity   4. Spinal cord stimulator (cervical leads)   5. Chronic right shoulder pain      Updated Problems: Problem  Long Term Current Use of Opiate Analgesic  Long  Term Prescription Opiate Use  Opiate use (60 MME/Day)   Analgesic: Hydrocodone/APAP 10/325 one every 4 hours (6 per day) (60 mg/day)   Presence of functional implant (Rechargable Medtronic Neurostimulator) (Cervical Epidural Leads)  CRPS (complex regional pain syndrome), type I, upper (Right)  Spinal cord stimulator (cervical leads)  Chronic right upper extremity pain  RSD (reflex sympathetic dystrophy) (right upper extremity)  Chronic Pain Syndrome   followed by Dr. Dossie Arbour ([facility])     Plan of Care  Wendy Snyder has a current medication list which includes the following long-term medication(s): hydrocodone-acetaminophen, [START ON 07/10/2020] hydrocodone-acetaminophen, [START ON 08/09/2020] hydrocodone-acetaminophen, levothyroxine, narcan, and venlafaxine xr.  Pharmacotherapy (Medications Ordered): Meds ordered this encounter  Medications  . HYDROcodone-acetaminophen (NORCO) 10-325 MG tablet    Sig: Take 1 tablet by mouth every 4 (four) hours as needed for severe pain. Must last 30 days    Dispense:  180 tablet    Refill:  0    Chronic Pain: STOP Act (Not applicable) Fill 1 day early if closed on refill date.  Avoid benzodiazepines within 8 hours of opioids  . HYDROcodone-acetaminophen (NORCO) 10-325 MG tablet    Sig: Take 1 tablet by mouth every 4 (four) hours as needed for severe pain. Must last 30 days    Dispense:  180 tablet    Refill:  0    Chronic Pain: STOP Act (Not applicable) Fill 1 day early if closed on refill date.  Avoid  benzodiazepines within 8 hours of opioids  . HYDROcodone-acetaminophen (NORCO) 10-325 MG tablet    Sig: Take 1 tablet by mouth every 4 (four) hours as needed for severe pain. Must last 30 days    Dispense:  180 tablet    Refill:  0    Chronic Pain: STOP Act (Not applicable) Fill 1 day early if closed on refill date.  Avoid benzodiazepines within 8 hours of opioids   Follow-up plan:   Return in about 3 months (around 09/09/2020) for Medication Management, in person Dr Delane Ginger.   Recent Visits No visits were found meeting these conditions. Showing recent visits within past 90 days and meeting all other requirements Today's Visits Date Type Provider Dept  06/10/20 Office Visit Gillis Santa, MD [facility]  Showing today's visits and meeting all other requirements Future Appointments No visits were found meeting these conditions. Showing future appointments within next 90 days and meeting all other requirements  I discussed the assessment and treatment plan with the patient. The patient was provided an opportunity to ask questions and all were answered. The patient agreed with the plan and demonstrated an understanding of the instructions.  Patient advised to call back or seek an in-person evaluation if the symptoms or condition worsens.  Duration of encounter:9mnutes.  Note by: BGillis Santa MD Date: 06/10/2020; Time: 10:17 AM

## 2020-07-01 ENCOUNTER — Other Ambulatory Visit: Payer: Self-pay | Admitting: Internal Medicine

## 2020-07-01 DIAGNOSIS — F064 Anxiety disorder due to known physiological condition: Secondary | ICD-10-CM

## 2020-07-01 NOTE — Telephone Encounter (Signed)
Please Advise last office visit 12/2019.  KP

## 2020-07-01 NOTE — Telephone Encounter (Signed)
Requested medication (s) are due for refill today: yes  Requested medication (s) are on the active medication list: yes  Last refill:  05/29/20  Future visit scheduled: no  Notes to clinic:  not delegated   Requested Prescriptions  Pending Prescriptions Disp Refills   ALPRAZolam (XANAX) 0.5 MG tablet [Pharmacy Med Name: ALPRAZOLAM 0.5 MG TABLET] 60 tablet     Sig: Take 1 tablet (0.5 mg total) by mouth 2 (two) times daily as needed for up to 60 doses for anxiety.      Not Delegated - Psychiatry:  Anxiolytics/Hypnotics Failed - 07/01/2020  1:35 PM      Failed - This refill cannot be delegated      Failed - Urine Drug Screen completed in last 360 days.      Failed - Valid encounter within last 6 months    Recent Outpatient Visits           6 months ago Annual physical exam   [facility] Reubin Milan, MD   1 year ago Hypothyroidism due to acquired atrophy of thyroid   [facility] Reubin Milan, MD   1 year ago Streptococcal pharyngitis   [facility] Reubin Milan, MD   1 year ago Hypothyroidism due to acquired atrophy of thyroid   [facility] Reubin Milan, MD   2 years ago Irritant contact dermatitis due to plants, except food   [facility] Reubin Milan, MD

## 2020-08-20 ENCOUNTER — Other Ambulatory Visit: Payer: Self-pay | Admitting: Internal Medicine

## 2020-08-20 DIAGNOSIS — F064 Anxiety disorder due to known physiological condition: Secondary | ICD-10-CM

## 2020-08-20 NOTE — Telephone Encounter (Signed)
Please Advise. Last office visit 01/01/20.  KP

## 2020-08-20 NOTE — Telephone Encounter (Signed)
Requested medication (s) are due for refill today - yes  Requested medication (s) are on the active medication list -yes  Future visit scheduled -no  Last refill: 05/29/20  Notes to clinic: Request rx- non delegated  Requested Prescriptions  Pending Prescriptions Disp Refills   ALPRAZolam (XANAX) 0.5 MG tablet [Pharmacy Med Name: ALPRAZOLAM 0.5 MG TABLET] 60 tablet     Sig: Take 1 tablet (0.5 mg total) by mouth 2 (two) times daily as needed for up to 60 doses for anxiety.      Not Delegated - Psychiatry:  Anxiolytics/Hypnotics Failed - 08/20/2020  9:51 AM      Failed - This refill cannot be delegated      Failed - Urine Drug Screen completed in last 360 days      Failed - Valid encounter within last 6 months    Recent Outpatient Visits           7 months ago Annual physical exam   [facility] Reubin Milan, MD   1 year ago Hypothyroidism due to acquired atrophy of thyroid   [facility] Reubin Milan, MD   1 year ago Streptococcal pharyngitis   [facility] Reubin Milan, MD   1 year ago Hypothyroidism due to acquired atrophy of thyroid   [facility] Reubin Milan, MD   2 years ago Irritant contact dermatitis due to plants, except food   [facility] Reubin Milan, MD                  Requested Prescriptions  Pending Prescriptions Disp Refills   ALPRAZolam Prudy Feeler) 0.5 MG tablet [Pharmacy Med Name: ALPRAZOLAM 0.5 MG TABLET] 60 tablet     Sig: Take 1 tablet (0.5 mg total) by mouth 2 (two) times daily as needed for up to 60 doses for anxiety.      Not Delegated - Psychiatry:  Anxiolytics/Hypnotics Failed - 08/20/2020  9:51 AM      Failed - This refill cannot be delegated      Failed - Urine Drug Screen completed in last 360 days      Failed - Valid encounter within last 6 months    Recent Outpatient Visits           7 months ago Annual physical exam   [facility] Reubin Milan, MD    1 year ago Hypothyroidism due to acquired atrophy of thyroid   [facility] Reubin Milan, MD   1 year ago Streptococcal pharyngitis   [facility] Reubin Milan, MD   1 year ago Hypothyroidism due to acquired atrophy of thyroid   [facility] Reubin Milan, MD   2 years ago Irritant contact dermatitis due to plants, except food   [facility] Reubin Milan, MD

## 2020-08-21 NOTE — Telephone Encounter (Signed)
Called pt, mailbox full

## 2020-09-06 NOTE — Progress Notes (Signed)
PROVIDER NOTE: Information contained herein reflects review and annotations entered in association with encounter. Interpretation of such information and data should be left to medically-trained personnel. Information provided to patient can be located elsewhere in the medical record under "Patient Instructions". Document created using STT-dictation technology, any transcriptional errors that may result from process are unintentional.    Patient: Wendy Snyder  Service Category: E/M  Provider: Gaspar Cola, MD  DOB: 1966-03-29  DOS: 09/09/2020  Specialty: Interventional Pain Management  MRN: [id number]  Setting: Ambulatory outpatient  PCP: Glean Hess, MD  Type: Established Patient    Referring Provider: Glean Hess, MD  Location: Office  Delivery: Face-to-face     HPI  [name], a 54 y.o. year old female, is here today because of her Chronic pain syndrome [G89.4]. [name] primary complain today is Back Pain Last encounter: My last encounter with her was on Visit date not found. Pertinent problems: [name] has Chronic pain syndrome; Presence of functional implant (Rechargable [employer] Neurostimulator) (Cervical Epidural Leads); CRPS (complex regional pain syndrome), type I, upper (Right); Spinal cord stimulator (cervical leads); Chronic right upper extremity pain; RSD (reflex sympathetic dystrophy) (right upper extremity); Chronic low back pain (Primary Area of Pain) (Bilateral) (R>L); Chronic hip pain (Secondary area of Pain) (Right); Chronic wrist pain (Third area of Pain) (Right) (CRPS); and Chronic right shoulder pain on their pertinent problem list. Pain Assessment: Severity of Chronic pain is reported as a 2 /10. Location: Back Left,Right,Lower/Denies. Onset: More than a month ago. Quality: Nagging,Sharp. Timing: Intermittent. Modifying factor(s): meds, standing help at times. Vitals:  height is 5' 7"  (1.702 m) and weight is 140 lb (63.5 kg). Her  temperature is 97.4 F (36.3 C) (abnormal). Her blood pressure is 145/97 (abnormal) and her pulse is 64. Her oxygen saturation is 100%.   Reason for encounter: medication management.  The patient indicates doing well with the current medication regimen. No adverse reactions or side effects reported to the medications. PMP & UDS compliant.  Today the patient arrives to her 8:20 AM encounter at 9:17 AM, almost 1 hour late.  The patient was again reminded that our cut off is 15 minutes.  Today I have given her a last warning with regards to this and I have allowed her to come in since she is running out of medicine on the twenty-fourth and I really do not want anybody calling me on Christmas eve telling me that they're running out of medicine and that they need a prescription when they did not attend their regular appointment.  In any case, she has been given a phone a warning on this.  RTCB: 12/08/2020  Pharmacotherapy Assessment   Analgesic: Hydrocodone/APAP 10/325, 1 tab PO q 4 hrs (60 mg/day of hydrocodone) MME/day:60 mg/day.   Monitoring: [place] PMP: PDMP reviewed during this encounter.       Pharmacotherapy: No side-effects or adverse reactions reported. Compliance: No problems identified. Effectiveness: Clinically acceptable.  Chauncey Fischer, RN  09/09/2020  9:21 AM  Sign when Signing Visit Nursing Pain Medication Assessment:  Safety precautions to be maintained throughout the outpatient stay will include: orient to surroundings, keep bed in low position, maintain call bell within reach at all times, provide assistance with transfer out of bed and ambulation.  Medication Inspection Compliance: Pill count conducted under aseptic conditions, in front of the patient. Neither the pills nor the bottle was removed from the patient's sight at any time. Once count was completed  pills were immediately returned to the patient in their original bottle.  Medication: Hydrocodone/APAP Pill/Patch Count: 19  of 180 pills remain Pill/Patch Appearance: Markings consistent with prescribed medication Bottle Appearance: Standard pharmacy container. Clearly labeled. Filled Date: 76 / 24 / 21 Last Medication intake:  TodaySafety precautions to be maintained throughout the outpatient stay will include: orient to surroundings, keep bed in low position, maintain call bell within reach at all times, provide assistance with transfer out of bed and ambulation.     UDS:  Summary  Date Value Ref Range Status  12/10/2019 Note  Final    Comment:    ==================================================================== [employer] (MW) ==================================================================== Test                             Result       Flag       Units Drug Present and Declared for Prescription Verification   Hydrocodone                    1777         EXPECTED   ng/mg creat   Hydromorphone                  646          EXPECTED   ng/mg creat   Dihydrocodeine                 261          EXPECTED   ng/mg creat   Norhydrocodone                 >3571        EXPECTED   ng/mg creat    Sources of hydrocodone include scheduled prescription medications.    Hydromorphone, dihydrocodeine and norhydrocodone are expected    metabolites of hydrocodone. Hydromorphone and dihydrocodeine are    also available as scheduled prescription medications. Drug Absent but Declared for Prescription Verification   Alprazolam                     Not Detected UNEXPECTED ng/mg creat ==================================================================== Test                      Result    Flag   Units      Ref Range   Creatinine              140              mg/dL      >=20 ==================================================================== Declared Medications:  The flagging and interpretation on this report are based on the  following declared medications.  Unexpected results may arise from  inaccuracies in the  declared medications.  **Note: The testing scope of this panel includes these medications:  Alprazolam  Hydrocodone (Norco)  **Note: The testing scope of this panel does not include the  following reported medications:  Acetaminophen (Norco)  Levothyroxine  Multivitamin  Naloxone  Naproxen  Topical  Valacyclovir (Valtrex)  Venlafaxine  Vitamin D3 ==================================================================== For clinical consultation, please call [phone]. ====================================================================      ROS  Constitutional: Denies any fever or chills Gastrointestinal: No reported hemesis, hematochezia, vomiting, or acute GI distress Musculoskeletal: Denies any acute onset joint swelling, redness, loss of ROM, or weakness Neurological: No reported episodes of acute onset apraxia, aphasia, dysarthria, agnosia, amnesia, paralysis, loss of coordination, or loss of consciousness  Medication Review  ALPRAZolam, HYDROcodone-acetaminophen, Vitamin D3, acyclovir ointment, levothyroxine, multivitamin, naloxone, naproxen sodium, valACYclovir, and venlafaxine XR  History Review  Allergy: [name] is allergic to phenergan [promethazine hcl]. Drug: [name]  reports no history of drug use. Alcohol:  reports no history of alcohol use. Tobacco:  reports that she has never smoked. She has never used smokeless tobacco. Social: [name]  reports that she has never smoked. She has never used smokeless tobacco. She reports that she does not drink alcohol and does not use drugs. Medical:  has a past medical history of Depression, Hypothyroidism, and Thyroid disease. Surgical: [name]  has a past surgical history that includes Spinal cord stimulator implant (2003); Tubal ligation (1990); Fracture surgery (Right); and Breast cyst excision (Left). Family: family history includes Breast cancer in her maternal grandmother; Breast cancer (age of onset:  60) in her mother; Depression in her mother. She was adopted.  Laboratory Chemistry Profile   Renal Lab Results  Component Value Date   BUN 17 12/10/2019   CREATININE 0.87 12/10/2019   BCR 20 12/10/2019   GFRAA 88 12/10/2019   GFRNONAA 76 12/10/2019     Hepatic Lab Results  Component Value Date   AST 23 12/10/2019   ALT 18 10/15/2018   ALBUMIN 4.8 12/10/2019   ALKPHOS 49 12/10/2019     Electrolytes Lab Results  Component Value Date   NA 140 12/10/2019   K 4.3 12/10/2019   CL 104 12/10/2019   CALCIUM 9.8 12/10/2019   MG 2.1 12/10/2019     Bone Lab Results  Component Value Date   25OHVITD1 84 12/10/2019   25OHVITD2 <1.0 12/10/2019   25OHVITD3 84 12/10/2019     Inflammation (CRP: Acute Phase) (ESR: Chronic Phase) Lab Results  Component Value Date   CRP <1 12/10/2019   ESRSEDRATE 2 12/10/2019       Note: Above Lab results reviewed.  Recent Imaging Review  US BREAST LTD UNI LEFT INC AXILLA CLINICAL DATA:  Two left breast masses seen on most recent screening mammography.  EXAM: DIGITAL DIAGNOSTIC LEFT MAMMOGRAM WITH CAD AND TOMO  ULTRASOUND LEFT BREAST  COMPARISON:  Previous exam(s).  ACR Breast Density Category c: The breast tissue is heterogeneously dense, which may obscure small masses.  FINDINGS: Additional mammographic views of the left breast demonstrate 2 persistent circumscribed less than a cm rim calcified masses in the subareolar and lower central left breast, anterior depth. Both of these masses demonstrate mammographic appearance consistent with rim calcified cysts.  Mammographic images were processed with CAD.  On physical exam, no suspicious masses are palpated.  Targeted ultrasound is performed, showing 2 corresponding rim calcified cysts in the left 6 o'clock breast 2 cm from the nipple measuring 7 mm, and left 12 o'clock subareolar breast, measuring 5 mm. No suspicious masses are seen.  IMPRESSION: Two rim calcified cysts in  the left breast correspond to the mammographically seen masses on most recent screening mammography.  No mammographic evidence of malignancy in the left breast.  RECOMMENDATION: Screening mammogram in one year.(Code:SM-B-01Y)  I have discussed the findings and recommendations with the patient. Results were also provided in writing at the conclusion of the visit. If applicable, a reminder letter will be sent to the patient regarding the next appointment.  BI-RADS CATEGORY  2: Benign.  Electronically Signed   By: Fidela Salisbury M.D.   On: 10/30/2018 15:27 MM DIAG BREAST TOMO UNI LEFT CLINICAL DATA:  Two left breast masses seen on most  recent screening mammography.  EXAM: DIGITAL DIAGNOSTIC LEFT MAMMOGRAM WITH CAD AND TOMO  ULTRASOUND LEFT BREAST  COMPARISON:  Previous exam(s).  ACR Breast Density Category c: The breast tissue is heterogeneously dense, which may obscure small masses.  FINDINGS: Additional mammographic views of the left breast demonstrate 2 persistent circumscribed less than a cm rim calcified masses in the subareolar and lower central left breast, anterior depth. Both of these masses demonstrate mammographic appearance consistent with rim calcified cysts.  Mammographic images were processed with CAD.  On physical exam, no suspicious masses are palpated.  Targeted ultrasound is performed, showing 2 corresponding rim calcified cysts in the left 6 o'clock breast 2 cm from the nipple measuring 7 mm, and left 12 o'clock subareolar breast, measuring 5 mm. No suspicious masses are seen.  IMPRESSION: Two rim calcified cysts in the left breast correspond to the mammographically seen masses on most recent screening mammography.  No mammographic evidence of malignancy in the left breast.  RECOMMENDATION: Screening mammogram in one year.(Code:SM-B-01Y)  I have discussed the findings and recommendations with the patient. Results were also provided in  writing at the conclusion of the visit. If applicable, a reminder letter will be sent to the patient regarding the next appointment.  BI-RADS CATEGORY  2: Benign.  Electronically Signed   By: Fidela Salisbury M.D.   On: 10/30/2018 15:27 Note: Reviewed        Physical Exam  General appearance: Well nourished, well developed, and well hydrated. In no apparent acute distress Mental status: Alert, oriented x 3 (person, place, & time)       Respiratory: No evidence of acute respiratory distress Eyes: PERLA Vitals: BP (!) 145/97   Pulse 64   Temp (!) 97.4 F (36.3 C)   Ht 5' 7"  (1.702 m)   Wt 140 lb (63.5 kg)   LMP 10/13/2016 (Within Days)   SpO2 100%   BMI 21.93 kg/m  BMI: Estimated body mass index is 21.93 kg/m as calculated from the following:   Height as of this encounter: 5' 7"  (1.702 m).   Weight as of this encounter: 140 lb (63.5 kg). Ideal: Ideal body weight: 61.6 kg (135 lb 12.9 oz) Adjusted ideal body weight: 62.4 kg (137 lb 7.7 oz)  Assessment   Status Diagnosis  Controlled Controlled Controlled 1. Chronic pain syndrome   2. Chronic low back pain (Primary Area of Pain) (Bilateral) (R>L)   3. Complex regional pain syndrome type 1 of right upper extremity   4. Chronic right shoulder pain   5. Spinal cord stimulator (cervical leads)   6. Pharmacologic therapy   7. Uncomplicated opioid dependence ([place])      Updated Problems: No problems updated.  Plan of Care  Problem-specific:  No problem-specific Assessment & Plan notes found for this encounter.  [name] has a current medication list which includes the following long-term medication(s): levothyroxine, narcan, venlafaxine xr, hydrocodone-acetaminophen, [START ON 10/09/2020] hydrocodone-acetaminophen, and [START ON 11/08/2020] hydrocodone-acetaminophen.  Pharmacotherapy (Medications Ordered): Meds ordered this encounter  Medications  . HYDROcodone-acetaminophen (NORCO) 10-325 MG tablet     Sig: Take 1 tablet by mouth every 4 (four) hours as needed for severe pain. Must last 30 days    Dispense:  180 tablet    Refill:  0    Chronic Pain: STOP Act (Not applicable) Fill 1 day early if closed on refill date.  Avoid benzodiazepines within 8 hours of opioids  . HYDROcodone-acetaminophen (NORCO) 10-325 MG tablet    Sig:  Take 1 tablet by mouth every 4 (four) hours as needed for severe pain. Must last 30 days    Dispense:  180 tablet    Refill:  0    Chronic Pain: STOP Act (Not applicable) Fill 1 day early if closed on refill date.  Avoid benzodiazepines within 8 hours of opioids  . HYDROcodone-acetaminophen (NORCO) 10-325 MG tablet    Sig: Take 1 tablet by mouth every 4 (four) hours as needed for severe pain. Must last 30 days    Dispense:  180 tablet    Refill:  0    Chronic Pain: STOP Act (Not applicable) Fill 1 day early if closed on refill date.  Avoid benzodiazepines within 8 hours of opioids   Orders:  No orders of the defined types were placed in this encounter.  Follow-up plan:   Return in about 3 months (around 12/08/2020) for (F2F), (Med Mgmt).      Interventional therapies:  Considering:   Diagnostic bilateral lumbar facet block  Diagnostic IA right hip injection    Palliative PRN treatment(s):   Palliative right stellate ganglion block  Management of Cervical spinal cord stimulator     Recent Visits No visits were found meeting these conditions. Showing recent visits within past 90 days and meeting all other requirements Today's Visits Date Type Provider Dept  09/09/20 Office Visit Milinda Pointer, MD [facility]  Showing today's visits and meeting all other requirements Future Appointments No visits were found meeting these conditions. Showing future appointments within next 90 days and meeting all other requirements  I discussed the assessment and treatment plan with the patient. The patient was provided an opportunity to ask questions and all  were answered. The patient agreed with the plan and demonstrated an understanding of the instructions.  Patient advised to call back or seek an in-person evaluation if the symptoms or condition worsens.  Duration of encounter: 30 minutes.  Note by: Gaspar Cola, MD Date: 09/09/2020; Time: 9:37 AM

## 2020-09-09 ENCOUNTER — Encounter: Payer: Self-pay | Admitting: Pain Medicine

## 2020-09-09 ENCOUNTER — Other Ambulatory Visit: Payer: Self-pay

## 2020-09-09 ENCOUNTER — Ambulatory Visit: Payer: PRIVATE HEALTH INSURANCE | Attending: Pain Medicine | Admitting: Pain Medicine

## 2020-09-09 VITALS — BP 145/97 | HR 64 | Temp 97.4°F | Ht 67.0 in | Wt 140.0 lb

## 2020-09-09 DIAGNOSIS — M25511 Pain in right shoulder: Secondary | ICD-10-CM

## 2020-09-09 DIAGNOSIS — G894 Chronic pain syndrome: Secondary | ICD-10-CM | POA: Diagnosis present

## 2020-09-09 DIAGNOSIS — Z79899 Other long term (current) drug therapy: Secondary | ICD-10-CM | POA: Diagnosis present

## 2020-09-09 DIAGNOSIS — F1120 Opioid dependence, uncomplicated: Secondary | ICD-10-CM | POA: Diagnosis present

## 2020-09-09 DIAGNOSIS — M545 Low back pain, unspecified: Secondary | ICD-10-CM | POA: Insufficient documentation

## 2020-09-09 DIAGNOSIS — G8929 Other chronic pain: Secondary | ICD-10-CM

## 2020-09-09 DIAGNOSIS — G90511 Complex regional pain syndrome I of right upper limb: Secondary | ICD-10-CM | POA: Diagnosis present

## 2020-09-09 DIAGNOSIS — Z9689 Presence of other specified functional implants: Secondary | ICD-10-CM | POA: Diagnosis present

## 2020-09-09 MED ORDER — HYDROCODONE-ACETAMINOPHEN 10-325 MG PO TABS: 1.0000 | ORAL_TABLET | ORAL | 0 refills | Status: DC | PRN

## 2020-09-09 NOTE — Progress Notes (Signed)
Nursing Pain Medication Assessment:  Safety precautions to be maintained throughout the outpatient stay will include: orient to surroundings, keep bed in low position, maintain call bell within reach at all times, provide assistance with transfer out of bed and ambulation.  Medication Inspection Compliance: Pill count conducted under aseptic conditions, in front of the patient. Neither the pills nor the bottle was removed from the patient's sight at any time. Once count was completed pills were immediately returned to the patient in their original bottle.  Medication: Hydrocodone/APAP Pill/Patch Count: 19 of 180 pills remain Pill/Patch Appearance: Markings consistent with prescribed medication Bottle Appearance: Standard pharmacy container. Clearly labeled. Filled Date: 2 / 24 / 21 Last Medication intake:  TodaySafety precautions to be maintained throughout the outpatient stay will include: orient to surroundings, keep bed in low position, maintain call bell within reach at all times, provide assistance with transfer out of bed and ambulation.

## 2020-09-09 NOTE — Patient Instructions (Signed)
____________________________________________________________________________________________  Medication Rules  Purpose: To inform patients, and their family members, of our rules and regulations.  Applies to: All patients receiving prescriptions (written or electronic).  Pharmacy of record: Pharmacy where electronic prescriptions will be sent. If written prescriptions are taken to a different pharmacy, please inform the nursing staff. The pharmacy listed in the electronic medical record should be the one where you would like electronic prescriptions to be sent.  Electronic prescriptions: In compliance with the [place] [employer] Opioid Misuse Prevention (STOP) Act of 2017 (Session Law 2017-74/H243), effective September 19, 2018, all controlled substances must be electronically prescribed. Calling prescriptions to the pharmacy will cease to exist.  Prescription refills: Only during scheduled appointments. Applies to all prescriptions.  NOTE: The following applies primarily to controlled substances (Opioid* Pain Medications).   Type of encounter (visit): For patients receiving controlled substances, face-to-face visits are required. (Not an option or up to the patient.)  Patient's responsibilities: 1. Pain Pills: Bring all pain pills to every appointment (except for procedure appointments). 2. Pill Bottles: Bring pills in original pharmacy bottle. Always bring the newest bottle. Bring bottle, even if empty. 3. Medication refills: You are responsible for knowing and keeping track of what medications you take and those you need refilled. The day before your appointment: write a list of all prescriptions that need to be refilled. The day of the appointment: give the list to the admitting nurse. Prescriptions will be written only during appointments. No prescriptions will be written on procedure days. If you forget a medication: it will not be "Called in", "Faxed", or "electronically sent".  You will need to get another appointment to get these prescribed. No early refills. Do not call asking to have your prescription filled early. 4. Prescription Accuracy: You are responsible for carefully inspecting your prescriptions before leaving our office. Have the discharge nurse carefully go over each prescription with you, before taking them home. Make sure that your name is accurately spelled, that your address is correct. Check the name and dose of your medication to make sure it is accurate. Check the number of pills, and the written instructions to make sure they are clear and accurate. Make sure that you are given enough medication to last until your next medication refill appointment. 5. Taking Medication: Take medication as prescribed. When it comes to controlled substances, taking less pills or less frequently than prescribed is permitted and encouraged. Never take more pills than instructed. Never take medication more frequently than prescribed.  6. Inform other Doctors: Always inform, all of your healthcare providers, of all the medications you take. 7. Pain Medication from other Providers: You are not allowed to accept any additional pain medication from any other Doctor or Healthcare provider. There are two exceptions to this rule. (see below) In the event that you require additional pain medication, you are responsible for notifying us, as stated below. 8. Cough Medicine: Often these contain an opioid, such as codeine or hydrocodone. Never accept or take cough medicine containing these opioids if you are already taking an opioid* medication. The combination may cause respiratory failure and death. 9. Medication Agreement: You are responsible for carefully reading and following our Medication Agreement. This must be signed before receiving any prescriptions from our practice. Safely store a copy of your signed Agreement. Violations to the Agreement will result in no further prescriptions.  (Additional copies of our Medication Agreement are available upon request.) 10. Laws, Rules, & Regulations: All patients are expected to follow all   Federal and State Laws, Statutes, Rules, & Regulations. Ignorance of the Laws does not constitute a valid excuse.  11. Illegal drugs and Controlled Substances: The use of illegal substances (including, but not limited to marijuana and its derivatives) and/or the illegal use of any controlled substances is strictly prohibited. Violation of this rule may result in the immediate and permanent discontinuation of any and all prescriptions being written by our practice. The use of any illegal substances is prohibited. 12. Adopted CDC guidelines & recommendations: Target dosing levels will be at or below 60 MME/day. Use of benzodiazepines** is not recommended.  Exceptions: There are only two exceptions to the rule of not receiving pain medications from other Healthcare Providers. 1. Exception #1 (Emergencies): In the event of an emergency (i.e.: accident requiring emergency care), you are allowed to receive additional pain medication. However, you are responsible for: As soon as you are able, call our office [phone], at any time of the day or night, and leave a message stating your name, the date and nature of the emergency, and the name and dose of the medication prescribed. In the event that your call is answered by a member of our staff, make sure to document and save the date, time, and the name of the person that took your information.  2. Exception #2 (Planned Surgery): In the event that you are scheduled by another doctor or dentist to have any type of surgery or procedure, you are allowed (for a period no longer than 30 days), to receive additional pain medication, for the acute post-op pain. However, in this case, you are responsible for picking up a copy of our "Post-op Pain Management for Surgeons" handout, and giving it to your surgeon or dentist. This  document is available at our office, and does not require an appointment to obtain it. Simply go to our office during business hours (Monday-Thursday from 8:00 AM to 4:00 PM) (Friday 8:00 AM to 12:00 Noon) or if you have a scheduled appointment with us, prior to your surgery, and ask for it by name. In addition, you are responsible for: calling our office [phone], at any time of the day or night, and leaving a message stating your name, name of your surgeon, type of surgery, and date of procedure or surgery. Failure to comply with your responsibilities may result in termination of therapy involving the controlled substances.  *Opioid medications include: morphine, codeine, oxycodone, oxymorphone, hydrocodone, hydromorphone, meperidine, tramadol, tapentadol, buprenorphine, fentanyl, methadone. **Benzodiazepine medications include: diazepam (Valium), alprazolam (Xanax), clonazepam (Klonopine), lorazepam (Ativan), clorazepate (Tranxene), chlordiazepoxide (Librium), estazolam (Prosom), oxazepam (Serax), temazepam (Restoril), triazolam (Halcion) (Last updated: 08/17/2020) ____________________________________________________________________________________________   ____________________________________________________________________________________________  Medication Recommendations and Reminders  Applies to: All patients receiving prescriptions (written and/or electronic).  Medication Rules & Regulations: These rules and regulations exist for your safety and that of others. They are not flexible and neither are we. Dismissing or ignoring them will be considered "non-compliance" with medication therapy, resulting in complete and irreversible termination of such therapy. (See document titled "Medication Rules" for more details.) In all conscience, because of safety reasons, we cannot continue providing a therapy where the patient does not follow instructions.  Pharmacy of record:   Definition:  This is the pharmacy where your electronic prescriptions will be sent.   We do not endorse any particular pharmacy, however, we have experienced problems with [facility] not securing enough medication supply for the community.  We do not restrict you in your choice of pharmacy. However,   once we write for your prescriptions, we will NOT be re-sending more prescriptions to fix restricted supply problems created by your pharmacy, or your insurance.   The pharmacy listed in the electronic medical record should be the one where you want electronic prescriptions to be sent.  If you choose to change pharmacy, simply notify our nursing staff.  Recommendations:  Keep all of your pain medications in a safe place, under lock and key, even if you live alone. We will NOT replace lost, stolen, or damaged medication.  After you fill your prescription, take 1 week's worth of pills and put them away in a safe place. You should keep a separate, properly labeled bottle for this purpose. The remainder should be kept in the original bottle. Use this as your primary supply, until it runs out. Once it's gone, then you know that you have 1 week's worth of medicine, and it is time to come in for a prescription refill. If you do this correctly, it is unlikely that you will ever run out of medicine.  To make sure that the above recommendation works, it is very important that you make sure your medication refill appointments are scheduled at least 1 week before you run out of medicine. To do this in an effective manner, make sure that you do not leave the office without scheduling your next medication management appointment. Always ask the nursing staff to show you in your prescription , when your medication will be running out. Then arrange for the receptionist to get you a return appointment, at least 7 days before you run out of medicine. Do not wait until you have 1 or 2 pills left, to come in. This is very poor planning and  does not take into consideration that we may need to cancel appointments due to bad weather, sickness, or emergencies affecting our staff.  DO NOT ACCEPT A "Partial Fill": If for any reason your pharmacy does not have enough pills/tablets to completely fill or refill your prescription, do not allow for a "partial fill". The law allows the pharmacy to complete that prescription within 72 hours, without requiring a new prescription. If they do not fill the rest of your prescription within those 72 hours, you will need a separate prescription to fill the remaining amount, which we will NOT provide. If the reason for the partial fill is your insurance, you will need to talk to the pharmacist about payment alternatives for the remaining tablets, but again, DO NOT ACCEPT A PARTIAL FILL, unless you can trust your pharmacist to obtain the remainder of the pills within 72 hours.  Prescription refills and/or changes in medication(s):   Prescription refills, and/or changes in dose or medication, will be conducted only during scheduled medication management appointments. (Applies to both, written and electronic prescriptions.)  No refills on procedure days. No medication will be changed or started on procedure days. No changes, adjustments, and/or refills will be conducted on a procedure day. Doing so will interfere with the diagnostic portion of the procedure.  No phone refills. No medications will be "called into the pharmacy".  No Fax refills.  No weekend refills.  No Holliday refills.  No after hours refills.  Remember:  Business hours are:  Monday to Thursday 8:00 AM to 4:00 PM Provider's Schedule: Mahalie Kanner, MD - Appointments are:  Medication management: Monday and Wednesday 8:00 AM to 4:00 PM Procedure day: Tuesday and Thursday 7:30 AM to 4:00 PM Bilal Lateef, MD - Appointments are:    Medication management: Tuesday and Thursday 8:00 AM to 4:00 PM Procedure day: Monday and Wednesday  7:30 AM to 4:00 PM (Last update: 04/08/2020) ____________________________________________________________________________________________   ____________________________________________________________________________________________  CBD (cannabidiol) WARNING  Applicable to: All individuals currently taking or considering taking CBD (cannabidiol) and, more important, all patients taking opioid analgesic controlled substances (pain medication). (Example: oxycodone; oxymorphone; hydrocodone; hydromorphone; morphine; methadone; tramadol; tapentadol; fentanyl; buprenorphine; butorphanol; dextromethorphan; meperidine; codeine; etc.)  Legal status: CBD remains a Schedule I drug prohibited for any use. CBD is illegal with one exception. In the [place], CBD has a limited Food and Drug Administration (FDA) approval for the treatment of two specific types of epilepsy disorders. Only one CBD product has been approved by the FDA for this purpose: "Epidiolex". FDA is aware that some companies are marketing products containing cannabis and cannabis-derived compounds in ways that violate the Federal Food, Drug and Cosmetic Act (FD&C Act) and that may put the health and safety of consumers at risk. The FDA, a Federal agency, has not enforced the CBD status since 2018.   Legality: Some manufacturers ship CBD products nationally, which is illegal. Often such products are sold online and are therefore available throughout the country. CBD is openly sold in head shops and health food stores in some states where such sales have not been explicitly legalized. Selling unapproved products with unsubstantiated therapeutic claims is not only a violation of the law, but also can put patients at risk, as these products have not been proven to be safe or effective. Federal illegality makes it difficult to conduct research on CBD.  Reference: "FDA Regulation of Cannabis and Cannabis-Derived Products, Including Cannabidiol  (CBD)" - https://www.fda.gov/news-events/public-health-focus/fda-regulation-cannabis-and-cannabis-derived-products-including-cannabidiol-cbd  Warning: CBD is not FDA approved and has not undergo the same manufacturing controls as prescription drugs.  This means that the purity and safety of available CBD may be questionable. Most of the time, despite manufacturer's claims, it is contaminated with THC (delta-9-tetrahydrocannabinol - the chemical in marijuana responsible for the "HIGH").  When this is the case, the THC contaminant will trigger a positive urine drug screen (UDS) test for Marijuana (carboxy-THC). Because a positive UDS for any illicit substance is a violation of our medication agreement, your opioid analgesics (pain medicine) may be permanently discontinued.  MORE ABOUT CBD  General Information: CBD  is a derivative of the Marijuana (cannabis sativa) plant discovered in 1940. It is one of the 113 identified substances found in Marijuana. It accounts for up to 40% of the plant's extract. As of 2018, preliminary clinical studies on CBD included research for the treatment of anxiety, movement disorders, and pain. CBD is available and consumed in multiple forms, including inhalation of smoke or vapor, as an aerosol spray, and by mouth. It may be supplied as an oil containing CBD, capsules, dried cannabis, or as a liquid solution. CBD is thought not to be as psychoactive as THC (delta-9-tetrahydrocannabinol - the chemical in marijuana responsible for the "HIGH"). Studies suggest that CBD may interact with different biological target receptors in the body, including cannabinoid and other neurotransmitter receptors. As of 2018 the mechanism of action for its biological effects has not been determined.  Side-effects  Adverse reactions: Dry mouth, diarrhea, decreased appetite, fatigue, drowsiness, malaise, weakness, sleep disturbances, and others.  Drug interactions: CBC may interact with other  medications such as blood-thinners. (Last update: 04/25/2020) ____________________________________________________________________________________________    

## 2020-09-27 ENCOUNTER — Encounter: Payer: Self-pay | Admitting: Internal Medicine

## 2020-10-01 ENCOUNTER — Other Ambulatory Visit: Payer: Self-pay | Admitting: Internal Medicine

## 2020-10-01 DIAGNOSIS — F064 Anxiety disorder due to known physiological condition: Secondary | ICD-10-CM

## 2020-10-01 DIAGNOSIS — E034 Atrophy of thyroid (acquired): Secondary | ICD-10-CM

## 2020-11-04 ENCOUNTER — Other Ambulatory Visit: Payer: Self-pay | Admitting: Internal Medicine

## 2020-11-04 DIAGNOSIS — F064 Anxiety disorder due to known physiological condition: Secondary | ICD-10-CM

## 2020-11-04 NOTE — Telephone Encounter (Signed)
Requested medication (s) are due for refill today: Yes  Requested medication (s) are on the active medication list: Yes  Last refill:  10/01/20  Future visit scheduled: Yes  Notes to clinic:  See request.    Requested Prescriptions  Pending Prescriptions Disp Refills   ALPRAZolam (XANAX) 0.5 MG tablet [Pharmacy Med Name: ALPRAZOLAM 0.5 MG TABLET] 60 tablet 0    Sig: TAKE 1 TABLET (0.5 MG TOTAL) BY MOUTH 2 (TWO) TIMES DAILY AS NEEDED FOR UP TO 60 DOSES FOR ANXIETY.      Not Delegated - Psychiatry:  Anxiolytics/Hypnotics Failed - 11/04/2020  2:30 PM      Failed - This refill cannot be delegated      Failed - Urine Drug Screen completed in last 360 days      Failed - Valid encounter within last 6 months    Recent Outpatient Visits           10 months ago Annual physical exam   [facility] Reubin Milan, MD   1 year ago Hypothyroidism due to acquired atrophy of thyroid   [facility] Reubin Milan, MD   1 year ago Streptococcal pharyngitis   [facility] Reubin Milan, MD   2 years ago Hypothyroidism due to acquired atrophy of thyroid   [facility] Reubin Milan, MD   2 years ago Irritant contact dermatitis due to plants, except food   [facility] Reubin Milan, MD       Future Appointments             In 2 months Judithann Graves Nyoka Cowden, MD [facility], [facility]

## 2020-11-05 NOTE — Telephone Encounter (Signed)
Please review. Last office visit 01/01/2020.  KP

## 2020-12-06 NOTE — Progress Notes (Signed)
PROVIDER NOTE: Information contained herein reflects review and annotations entered in association with encounter. Interpretation of such information and data should be left to medically-trained personnel. Information provided to patient can be located elsewhere in the medical record under "Patient Instructions". Document created using STT-dictation technology, any transcriptional errors that may result from process are unintentional.    Patient: Wendy Snyder  Service Category: E/M  Provider: Gaspar Cola, MD  DOB: 05/21/1966  DOS: 12/07/2020  Specialty: Interventional Pain Management  MRN: [id number]  Setting: Ambulatory outpatient  PCP: Glean Hess, MD  Type: Established Patient    Referring Provider: Glean Hess, MD  Location: Office  Delivery: Face-to-face     HPI  [name] , a 55 y.o. year old female, is here today because of her Chronic pain syndrome [G89.4]. [name] primary complain today is Back Pain (Low left) and Shoulder Pain (right) Last encounter: My last encounter with her was on 09/09/2020. Pertinent problems: [name] has Chronic pain syndrome; Presence of functional implant (Rechargable Medtronic Neurostimulator) (Cervical Epidural Leads); CRPS (complex regional pain syndrome), type I, upper (Right); Spinal cord stimulator (cervical leads); Chronic right upper extremity pain; RSD (reflex sympathetic dystrophy) (right upper extremity); Chronic low back pain (Primary Area of Pain) (Bilateral) (R>L); Chronic hip pain (Secondary area of Pain) (Right); Chronic wrist pain (Third area of Pain) (Right) (CRPS); and Chronic right shoulder pain on their pertinent problem list. Pain Assessment: Severity of Chronic pain is reported as a 2 /10. Location: Back Lower,Left/denies. Onset: More than a month ago. Quality: Aching,Sharp. Timing: Intermittent. Modifying factor(s): medication, heat. Vitals:  height is 5' 7"  (1.702 m) and weight is 140 lb (63.5  kg). Her temperature is 97.2 F (36.2 C) (abnormal). Her blood pressure is 119/90 and her pulse is 67. Her respiration is 16 and oxygen saturation is 98%.   Reason for encounter: medication management.   The patient indicates doing well with the current medication regimen. No adverse reactions or side effects reported to the medications.   RTCB: 03/08/2021  Pharmacotherapy Assessment   Analgesic: Hydrocodone/APAP 10/325, 1 tab PO q 4 hrs (60 mg/day of hydrocodone) MME/day:60 mg/day.   Monitoring: [place] PMP: PDMP reviewed during this encounter.       Pharmacotherapy: No side-effects or adverse reactions reported. Compliance: No problems identified. Effectiveness: Clinically acceptable.  Wendy Shorter, RN  12/07/2020  8:34 AM  Signed Nursing Pain Medication Assessment:  Safety precautions to be maintained throughout the outpatient stay will include: orient to surroundings, keep bed in low position, maintain call bell within reach at all times, provide assistance with transfer out of bed and ambulation.  Medication Inspection Compliance: Pill count conducted under aseptic conditions, in front of the patient. Neither the pills nor the bottle was removed from the patient's sight at any time. Once count was completed pills were immediately returned to the patient in their original bottle.  Medication: Hydrocodone/APAP Pill/Patch Count: 89 of 180 pills remain Pill/Patch Appearance: Markings consistent with prescribed medication Bottle Appearance: Standard pharmacy container. Clearly labeled. Filled Date:02/ 25/ 2022 Last Medication intake:  Today    UDS:  Summary  Date Value Ref Range Status  12/10/2019 Note  Final    Comment:    ==================================================================== [employer] Select 13 (MW) ==================================================================== Test                             Result       Flag  Units Drug Present and Declared for  Prescription Verification   Hydrocodone                    1777         EXPECTED   ng/mg creat   Hydromorphone                  646          EXPECTED   ng/mg creat   Dihydrocodeine                 261          EXPECTED   ng/mg creat   Norhydrocodone                 >3571        EXPECTED   ng/mg creat    Sources of hydrocodone include scheduled prescription medications.    Hydromorphone, dihydrocodeine and norhydrocodone are expected    metabolites of hydrocodone. Hydromorphone and dihydrocodeine are    also available as scheduled prescription medications. Drug Absent but Declared for Prescription Verification   Alprazolam                     Not Detected UNEXPECTED ng/mg creat ==================================================================== Test                      Result    Flag   Units      Ref Range   Creatinine              140              mg/dL      >=20 ==================================================================== Declared Medications:  The flagging and interpretation on this report are based on the  following declared medications.  Unexpected results may arise from  inaccuracies in the declared medications.  **Note: The testing scope of this panel includes these medications:  Alprazolam  Hydrocodone (Norco)  **Note: The testing scope of this panel does not include the  following reported medications:  Acetaminophen (Norco)  Levothyroxine  Multivitamin  Naloxone  Naproxen  Topical  Valacyclovir (Valtrex)  Venlafaxine  Vitamin D3 ==================================================================== For clinical consultation, please call [phone]. ====================================================================      ROS  Constitutional: Denies any fever or chills Gastrointestinal: No reported hemesis, hematochezia, vomiting, or acute GI distress Musculoskeletal: Denies any acute onset joint swelling, redness, loss of ROM, or weakness Neurological:  No reported episodes of acute onset apraxia, aphasia, dysarthria, agnosia, amnesia, paralysis, loss of coordination, or loss of consciousness  Medication Review  ALPRAZolam, HYDROcodone-acetaminophen, Vitamin D3, acyclovir ointment, levothyroxine, multivitamin, naloxone, naproxen sodium, valACYclovir, and venlafaxine XR  History Review  Allergy: [name] is allergic to phenergan [promethazine hcl]. Drug: [name]  reports no history of drug use. Alcohol:  reports no history of alcohol use. Tobacco:  reports that she has never smoked. She has never used smokeless tobacco. Social: [name]  reports that she has never smoked. She has never used smokeless tobacco. She reports that she does not drink alcohol and does not use drugs. Medical:  has a past medical history of Depression, Hypothyroidism, and Thyroid disease. Surgical: [name]  has a past surgical history that includes Spinal cord stimulator implant (2003); Tubal ligation (1990); Fracture surgery (Right); and Breast cyst excision (Left). Family: family history includes Breast cancer in her maternal grandmother; Breast cancer (age of onset: 47) in her mother; Depression in her mother.  She was adopted.  Laboratory Chemistry Profile   Renal Lab Results  Component Value Date   BUN 17 12/10/2019   CREATININE 0.87 12/10/2019   BCR 20 12/10/2019   GFRAA 88 12/10/2019   GFRNONAA 76 12/10/2019     Hepatic Lab Results  Component Value Date   AST 23 12/10/2019   ALT 18 10/15/2018   ALBUMIN 4.8 12/10/2019   ALKPHOS 49 12/10/2019     Electrolytes Lab Results  Component Value Date   NA 140 12/10/2019   K 4.3 12/10/2019   CL 104 12/10/2019   CALCIUM 9.8 12/10/2019   MG 2.1 12/10/2019     Bone Lab Results  Component Value Date   25OHVITD1 84 12/10/2019   25OHVITD2 <1.0 12/10/2019   25OHVITD3 84 12/10/2019     Inflammation (CRP: Acute Phase) (ESR: Chronic Phase) Lab Results  Component Value Date   CRP <1  12/10/2019   ESRSEDRATE 2 12/10/2019       Note: Above Lab results reviewed.  Recent Imaging Review  US BREAST LTD UNI LEFT INC AXILLA CLINICAL DATA:  Two left breast masses seen on most recent screening mammography.  EXAM: DIGITAL DIAGNOSTIC LEFT MAMMOGRAM WITH CAD AND TOMO  ULTRASOUND LEFT BREAST  COMPARISON:  Previous exam(s).  ACR Breast Density Category c: The breast tissue is heterogeneously dense, which may obscure small masses.  FINDINGS: Additional mammographic views of the left breast demonstrate 2 persistent circumscribed less than a cm rim calcified masses in the subareolar and lower central left breast, anterior depth. Both of these masses demonstrate mammographic appearance consistent with rim calcified cysts.  Mammographic images were processed with CAD.  On physical exam, no suspicious masses are palpated.  Targeted ultrasound is performed, showing 2 corresponding rim calcified cysts in the left 6 o'clock breast 2 cm from the nipple measuring 7 mm, and left 12 o'clock subareolar breast, measuring 5 mm. No suspicious masses are seen.  IMPRESSION: Two rim calcified cysts in the left breast correspond to the mammographically seen masses on most recent screening mammography.  No mammographic evidence of malignancy in the left breast.  RECOMMENDATION: Screening mammogram in one year.(Code:[id number]-01Y)  I have discussed the findings and recommendations with the patient. Results were also provided in writing at the conclusion of the visit. If applicable, a reminder letter will be sent to the patient regarding the next appointment.  BI-RADS CATEGORY  2: Benign.  Electronically Signed   By: Fidela Salisbury M.D.   On: 10/30/2018 15:27 MM DIAG BREAST TOMO UNI LEFT CLINICAL DATA:  Two left breast masses seen on most recent screening mammography.  EXAM: DIGITAL DIAGNOSTIC LEFT MAMMOGRAM WITH CAD AND TOMO  ULTRASOUND LEFT BREAST  COMPARISON:   Previous exam(s).  ACR Breast Density Category c: The breast tissue is heterogeneously dense, which may obscure small masses.  FINDINGS: Additional mammographic views of the left breast demonstrate 2 persistent circumscribed less than a cm rim calcified masses in the subareolar and lower central left breast, anterior depth. Both of these masses demonstrate mammographic appearance consistent with rim calcified cysts.  Mammographic images were processed with CAD.  On physical exam, no suspicious masses are palpated.  Targeted ultrasound is performed, showing 2 corresponding rim calcified cysts in the left 6 o'clock breast 2 cm from the nipple measuring 7 mm, and left 12 o'clock subareolar breast, measuring 5 mm. No suspicious masses are seen.  IMPRESSION: Two rim calcified cysts in the left breast correspond to the mammographically seen masses on most recent  screening mammography.  No mammographic evidence of malignancy in the left breast.  RECOMMENDATION: Screening mammogram in one year.(Code:[id number])  I have discussed the findings and recommendations with the patient. Results were also provided in writing at the conclusion of the visit. If applicable, a reminder letter will be sent to the patient regarding the next appointment.  BI-RADS CATEGORY  2: Benign.  Electronically Signed   By: Fidela Salisbury M.D.   On: 10/30/2018 15:27 Note: Reviewed        Physical Exam  General appearance: Well nourished, well developed, and well hydrated. In no apparent acute distress Mental status: Alert, oriented x 3 (person, place, & time)       Respiratory: No evidence of acute respiratory distress Eyes: PERLA Vitals: BP 119/90   Pulse 67   Temp (!) 97.2 F (36.2 C)   Resp 16   Ht 5' 7"  (1.702 m)   Wt 140 lb (63.5 kg)   LMP 10/13/2016 (Within Days)   SpO2 98%   BMI 21.93 kg/m  BMI: Estimated body mass index is 21.93 kg/m as calculated from the following:   Height as of  this encounter: 5' 7"  (1.702 m).   Weight as of this encounter: 140 lb (63.5 kg). Ideal: Ideal body weight: 61.6 kg (135 lb 12.9 oz) Adjusted ideal body weight: 62.4 kg (137 lb 7.7 oz)  Assessment   Status Diagnosis  Controlled Controlled Controlled 1. Chronic pain syndrome   2. Chronic low back pain (Primary Area of Pain) (Bilateral) (R>L)   3. Chronic right shoulder pain   4. Chronic right upper extremity pain   5. RSD (reflex sympathetic dystrophy) (right upper extremity)   6. Presence of functional implant (Rechargable Medtronic Neurostimulator) (Cervical Epidural Leads)   7. Spinal cord stimulator (cervical leads)   8. Pharmacologic therapy   9. Long term prescription opiate use   10. Opiate use (60 MME/Day)      Updated Problems: No problems updated.  Plan of Care  Problem-specific:  No problem-specific Assessment & Plan notes found for this encounter.  [name] has a current medication list which includes the following long-term medication(s): [START ON 12/08/2020] hydrocodone-acetaminophen, [START ON 01/07/2021] hydrocodone-acetaminophen, [START ON 02/06/2021] hydrocodone-acetaminophen, levothyroxine, naloxone, and venlafaxine xr.  Pharmacotherapy (Medications Ordered): Meds ordered this encounter  Medications  . HYDROcodone-acetaminophen (NORCO) 10-325 MG tablet    Sig: Take 1 tablet by mouth every 4 (four) hours as needed for severe pain. Must last 30 days    Dispense:  180 tablet    Refill:  0    Not a duplicate. Do NOT delete! Chronic Pain: STOP Act NOT applicable. Fill 1 day early if closed on refill date. Avoid benzodiazepines within 8 hours of opioids. Do not send refill requests.  Marland Kitchen HYDROcodone-acetaminophen (NORCO) 10-325 MG tablet    Sig: Take 1 tablet by mouth every 4 (four) hours as needed for severe pain. Must last 30 days    Dispense:  180 tablet    Refill:  0    Not a duplicate. Do NOT delete! Chronic Pain: STOP Act NOT applicable. Fill 1  day early if closed on refill date. Avoid benzodiazepines within 8 hours of opioids. Do not send refill requests.  Marland Kitchen HYDROcodone-acetaminophen (NORCO) 10-325 MG tablet    Sig: Take 1 tablet by mouth every 4 (four) hours as needed for severe pain. Must last 30 days    Dispense:  180 tablet    Refill:  0    Not  a duplicate. Do NOT delete! Chronic Pain: STOP Act NOT applicable. Fill 1 day early if closed on refill date. Avoid benzodiazepines within 8 hours of opioids. Do not send refill requests.  . naloxone (NARCAN) nasal spray 4 mg/0.1 mL    Sig: Spray into one nostril. Repeat with second device into other nostril after 2-3 minutes if no or minimal response.    Dispense:  2 each    Refill:  0    Narcan Nasal Spray. (2 pack) Please provide the patient with clear instructions on the use of this device/medication.   Orders:  Orders Placed This Encounter  Procedures  . Tox[employer]SU[employer] Select 13 (MW), Urine    Volume: 30 ml(s). Minimum 3 ml of urine is needed. Document temperature of fresh sample. Indications: Long term (current) use of opiate analgesic (I71.245)    Order Specific Question:   Release to patient    Answer:   Immediate   Follow-up plan:   Return in about 13 weeks (around 03/08/2021) for (F2F), (MM).      Interventional therapies:  Considering:   Diagnostic bilateral lumbar facet block  Diagnostic IA right hip injection    Palliative PRN treatment(s):   Palliative right stellate ganglion block  Management of Cervical spinal cord stimulator      Recent Visits Date Type Provider Dept  09/09/20 Office Visit Milinda Pointer, MD [facility]  Showing recent visits within past 90 days and meeting all other requirements Today's Visits Date Type Provider Dept  12/07/20 Office Visit Milinda Pointer, MD [facility]  Showing today's visits and meeting all other requirements Future Appointments Date Type Provider Dept  03/03/21 Appointment Milinda Pointer,  MD [facility]  Showing future appointments within next 90 days and meeting all other requirements  I discussed the assessment and treatment plan with the patient. The patient was provided an opportunity to ask questions and all were answered. The patient agreed with the plan and demonstrated an understanding of the instructions.  Patient advised to call back or seek an in-person evaluation if the symptoms or condition worsens.  Duration of encounter: 30 minutes.  Note by: Gaspar Cola, MD Date: 12/07/2020; Time: 9:05 AM

## 2020-12-07 ENCOUNTER — Telehealth: Payer: Self-pay

## 2020-12-07 ENCOUNTER — Encounter: Payer: Self-pay | Admitting: Pain Medicine

## 2020-12-07 ENCOUNTER — Ambulatory Visit: Payer: PRIVATE HEALTH INSURANCE | Attending: Pain Medicine | Admitting: Pain Medicine

## 2020-12-07 ENCOUNTER — Other Ambulatory Visit: Payer: Self-pay

## 2020-12-07 VITALS — BP 119/90 | HR 67 | Temp 97.2°F | Resp 16 | Ht 67.0 in | Wt 140.0 lb

## 2020-12-07 DIAGNOSIS — G9050 Complex regional pain syndrome I, unspecified: Secondary | ICD-10-CM

## 2020-12-07 DIAGNOSIS — G8929 Other chronic pain: Secondary | ICD-10-CM | POA: Diagnosis present

## 2020-12-07 DIAGNOSIS — G894 Chronic pain syndrome: Secondary | ICD-10-CM | POA: Diagnosis present

## 2020-12-07 DIAGNOSIS — Z79899 Other long term (current) drug therapy: Secondary | ICD-10-CM | POA: Diagnosis present

## 2020-12-07 DIAGNOSIS — Z79891 Long term (current) use of opiate analgesic: Secondary | ICD-10-CM | POA: Diagnosis present

## 2020-12-07 DIAGNOSIS — Z9689 Presence of other specified functional implants: Secondary | ICD-10-CM

## 2020-12-07 DIAGNOSIS — M79601 Pain in right arm: Secondary | ICD-10-CM

## 2020-12-07 DIAGNOSIS — M25511 Pain in right shoulder: Secondary | ICD-10-CM | POA: Diagnosis present

## 2020-12-07 DIAGNOSIS — F1190 Opioid use, unspecified, uncomplicated: Secondary | ICD-10-CM | POA: Diagnosis present

## 2020-12-07 DIAGNOSIS — Z969 Presence of functional implant, unspecified: Secondary | ICD-10-CM

## 2020-12-07 DIAGNOSIS — M545 Low back pain, unspecified: Secondary | ICD-10-CM | POA: Diagnosis present

## 2020-12-07 MED ORDER — HYDROCODONE-ACETAMINOPHEN 10-325 MG PO TABS: 1.0000 | ORAL_TABLET | ORAL | 0 refills | Status: DC | PRN

## 2020-12-07 MED ORDER — NALOXONE HCL 4 MG/0.1ML NA LIQD
NASAL | 0 refills | Status: DC
Start: 2020-12-07 — End: 2022-09-06

## 2020-12-07 NOTE — Progress Notes (Signed)
Nursing Pain Medication Assessment:  Safety precautions to be maintained throughout the outpatient stay will include: orient to surroundings, keep bed in low position, maintain call bell within reach at all times, provide assistance with transfer out of bed and ambulation.  Medication Inspection Compliance: Pill count conducted under aseptic conditions, in front of the patient. Neither the pills nor the bottle was removed from the patient's sight at any time. Once count was completed pills were immediately returned to the patient in their original bottle.  Medication: Hydrocodone/APAP Pill/Patch Count: 89 of 180 pills remain Pill/Patch Appearance: Markings consistent with prescribed medication Bottle Appearance: Standard pharmacy container. Clearly labeled. Filled Date:02/ 25/ 2022 Last Medication intake:  Today

## 2020-12-15 LAB — TOXASSURE SELECT 13 (MW), URINE

## 2020-12-27 ENCOUNTER — Other Ambulatory Visit: Payer: Self-pay | Admitting: Internal Medicine

## 2020-12-27 DIAGNOSIS — F064 Anxiety disorder due to known physiological condition: Secondary | ICD-10-CM

## 2020-12-27 NOTE — Telephone Encounter (Signed)
Requested Prescriptions  Pending Prescriptions Disp Refills  . venlafaxine XR (EFFEXOR-XR) 75 MG 24 hr capsule [Pharmacy Med Name: VENLAFAXINE HCL ER 75 MG CAP] 9 capsule 0    Sig: TAKE 1 CAPSULE BY MOUTH EVERY DAY     Psychiatry: Antidepressants - SNRI - desvenlafaxine & venlafaxine Failed - 12/27/2020  9:33 AM      Failed - LDL in normal range and within 360 days    LDL Chol Calc (NIH)  Date Value Ref Range Status  01/01/2020 78 0 - 99 mg/dL Final         Failed - Total Cholesterol in normal range and within 360 days    Cholesterol, Total  Date Value Ref Range Status  01/01/2020 167 100 - 199 mg/dL Final         Failed - Triglycerides in normal range and within 360 days    Triglycerides  Date Value Ref Range Status  01/01/2020 115 0 - 149 mg/dL Final         Failed - Last BP in normal range    BP Readings from Last 1 Encounters:  12/07/20 119/90         Failed - Valid encounter within last 6 months    Recent Outpatient Visits          12 months ago Annual physical exam   [facility] Reubin Milan, MD   1 year ago Hypothyroidism due to acquired atrophy of thyroid   [facility] Reubin Milan, MD   1 year ago Streptococcal pharyngitis   [facility] Reubin Milan, MD   2 years ago Hypothyroidism due to acquired atrophy of thyroid   [facility] Reubin Milan, MD   2 years ago Irritant contact dermatitis due to plants, except food   [facility] Reubin Milan, MD      Future Appointments            In 1 week Judithann Graves Nyoka Cowden, MD [facility]           Passed - Completed PHQ-2 or PHQ-9 in the last 360 days

## 2021-01-05 ENCOUNTER — Ambulatory Visit (INDEPENDENT_AMBULATORY_CARE_PROVIDER_SITE_OTHER): Payer: PRIVATE HEALTH INSURANCE | Admitting: Internal Medicine

## 2021-01-05 ENCOUNTER — Other Ambulatory Visit: Payer: Self-pay

## 2021-01-05 ENCOUNTER — Encounter: Payer: Self-pay | Admitting: Internal Medicine

## 2021-01-05 VITALS — BP 130/84 | HR 78 | Temp 97.4°F | Ht 67.0 in | Wt 139.0 lb

## 2021-01-05 DIAGNOSIS — F064 Anxiety disorder due to known physiological condition: Secondary | ICD-10-CM | POA: Diagnosis not present

## 2021-01-05 DIAGNOSIS — B009 Herpesviral infection, unspecified: Secondary | ICD-10-CM

## 2021-01-05 DIAGNOSIS — Z1231 Encounter for screening mammogram for malignant neoplasm of breast: Secondary | ICD-10-CM

## 2021-01-05 DIAGNOSIS — Z0000 Encounter for general adult medical examination without abnormal findings: Secondary | ICD-10-CM

## 2021-01-05 DIAGNOSIS — Z1211 Encounter for screening for malignant neoplasm of colon: Secondary | ICD-10-CM | POA: Diagnosis not present

## 2021-01-05 DIAGNOSIS — F3341 Major depressive disorder, recurrent, in partial remission: Secondary | ICD-10-CM | POA: Diagnosis not present

## 2021-01-05 DIAGNOSIS — E034 Atrophy of thyroid (acquired): Secondary | ICD-10-CM

## 2021-01-05 DIAGNOSIS — F1120 Opioid dependence, uncomplicated: Secondary | ICD-10-CM

## 2021-01-05 MED ORDER — VENLAFAXINE HCL ER 75 MG PO CP24: 75.0000 mg | ORAL_CAPSULE | Freq: Every day | ORAL | 1 refills | Status: DC

## 2021-01-05 MED ORDER — VALACYCLOVIR HCL 1 G PO TABS: 1000.0000 mg | ORAL_TABLET | Freq: Two times a day (BID) | ORAL | 1 refills | Status: DC | PRN
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
## 2021-01-06 ENCOUNTER — Telehealth: Payer: Self-pay

## 2021-01-06 LAB — TSH+FREE T4
Free T4: 1.33 ng/dL (ref 0.82–1.77)
TSH: 1.79 u[IU]/mL (ref 0.450–4.500)

## 2021-01-06 LAB — CBC WITH DIFFERENTIAL/PLATELET
Basophils Absolute: 0.1 10*3/uL (ref 0.0–0.2)
Basos: 3 %
EOS (ABSOLUTE): 0.2 10*3/uL (ref 0.0–0.4)
Eos: 5 %
Hematocrit: 40.7 % (ref 34.0–46.6)
Hemoglobin: 13.1 g/dL (ref 11.1–15.9)
Immature Grans (Abs): 0 10*3/uL (ref 0.0–0.1)
Immature Granulocytes: 0 %
Lymphocytes Absolute: 1 10*3/uL (ref 0.7–3.1)
Lymphs: 29 %
MCH: 30.4 pg (ref 26.6–33.0)
MCHC: 32.2 g/dL (ref 31.5–35.7)
MCV: 94 fL (ref 79–97)
Monocytes Absolute: 0.3 10*3/uL (ref 0.1–0.9)
Monocytes: 10 %
Neutrophils Absolute: 1.7 10*3/uL (ref 1.4–7.0)
Neutrophils: 53 %
Platelets: 186 10*3/uL (ref 150–450)
RBC: 4.31 x10E6/uL (ref 3.77–5.28)
RDW: 12.1 % (ref 11.7–15.4)
WBC: 3.3 10*3/uL — ABNORMAL LOW (ref 3.4–10.8)

## 2021-01-06 LAB — COMPREHENSIVE METABOLIC PANEL
ALT: 17 IU/L (ref 0–32)
AST: 21 IU/L (ref 0–40)
Albumin/Globulin Ratio: 2.6 — ABNORMAL HIGH (ref 1.2–2.2)
Albumin: 4.7 g/dL (ref 3.8–4.9)
Alkaline Phosphatase: 55 IU/L (ref 44–121)
BUN/Creatinine Ratio: 15 (ref 9–23)
BUN: 13 mg/dL (ref 6–24)
Bilirubin Total: 0.3 mg/dL (ref 0.0–1.2)
CO2: 23 mmol/L (ref 20–29)
Calcium: 9.6 mg/dL (ref 8.7–10.2)
Chloride: 103 mmol/L (ref 96–106)
Creatinine, Ser: 0.85 mg/dL (ref 0.57–1.00)
Globulin, Total: 1.8 g/dL (ref 1.5–4.5)
Glucose: 88 mg/dL (ref 65–99)
Potassium: 4 mmol/L (ref 3.5–5.2)
Sodium: 143 mmol/L (ref 134–144)
Total Protein: 6.5 g/dL (ref 6.0–8.5)
eGFR: 81 mL/min/{1.73_m2} (ref >59)

## 2021-01-06 LAB — LIPID PANEL
Chol/HDL Ratio: 2.1 ratio (ref 0.0–4.4)
Cholesterol, Total: 194 mg/dL (ref 100–199)
HDL: 91 mg/dL (ref >39)
LDL Chol Calc (NIH): 89 mg/dL (ref 0–99)
Triglycerides: 81 mg/dL (ref 0–149)
VLDL Cholesterol Cal: 14 mg/dL (ref 5–40)

## 2021-01-06 NOTE — Telephone Encounter (Signed)
Copied from CRM [id number]. Topic: Quick Communication - Lab Results (Clinic Use ONLY) >> Jan 06, 2021 12:43 PM Crist Infante wrote: Pt calling back for lab results.  Not in chart NT can disclose. 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

## 2021-01-06 NOTE — Progress Notes (Signed)
Called patient to notify.  No answer, voicemail full. 

## 2021-01-07 ENCOUNTER — Encounter: Payer: Self-pay | Admitting: Internal Medicine

## 2021-01-22 ENCOUNTER — Other Ambulatory Visit: Payer: Self-pay

## 2021-01-22 ENCOUNTER — Ambulatory Visit
Admission: RE | Admit: 2021-01-22 | Discharge: 2021-01-22 | Disposition: A | Discharge status: Home / Self Care | Payer: 59 | Source: Ambulatory Visit | Attending: Internal Medicine | Admitting: Internal Medicine

## 2021-01-22 DIAGNOSIS — Z1231 Encounter for screening mammogram for malignant neoplasm of breast: Secondary | ICD-10-CM | POA: Diagnosis present

## 2021-02-28 ENCOUNTER — Other Ambulatory Visit: Payer: Self-pay | Admitting: Internal Medicine

## 2021-02-28 DIAGNOSIS — E034 Atrophy of thyroid (acquired): Secondary | ICD-10-CM

## 2021-02-28 DIAGNOSIS — F064 Anxiety disorder due to known physiological condition: Secondary | ICD-10-CM

## 2021-02-28 NOTE — Telephone Encounter (Signed)
Requested Prescriptions  Pending Prescriptions Disp Refills  . levothyroxine (SYNTHROID) 50 MCG tablet [Pharmacy Med Name: LEVOTHYROXINE 50 MCG TABLET] 90 tablet     Sig: TAKE 1 TABLET BY MOUTH EVERY DAY     Endocrinology:  Hypothyroid Agents Failed - 02/28/2021  9:10 AM      Failed - TSH needs to be rechecked within 3 months after an abnormal result. Refill until TSH is due.      Passed - TSH in normal range and within 360 days    TSH  Date Value Ref Range Status  01/05/2021 1.790 0.450 - 4.500 uIU/mL Final         Passed - Valid encounter within last 12 months    Recent Outpatient Visits          1 month ago Annual physical exam   [facility] Reubin Milan, MD   1 year ago Annual physical exam   [facility] Reubin Milan, MD   1 year ago Hypothyroidism due to acquired atrophy of thyroid   [facility] Reubin Milan, MD   2 years ago Streptococcal pharyngitis   [facility] Reubin Milan, MD   2 years ago Hypothyroidism due to acquired atrophy of thyroid   [facility] Reubin Milan, MD      Future Appointments            In 10 months Reubin Milan, MD [facility]           . ALPRAZolam (XANAX) 0.5 MG tablet [Pharmacy Med Name: ALPRAZOLAM 0.5 MG TABLET] 60 tablet     Sig: TAKE 1 TABLET (0.5 MG TOTAL) BY MOUTH 2 (TWO) TIMES DAILY AS NEEDED FOR UP TO 60 DOSES FOR ANXIETY.     Not Delegated - Psychiatry:  Anxiolytics/Hypnotics Failed - 02/28/2021  9:10 AM      Failed - This refill cannot be delegated      Failed - Urine Drug Screen completed in last 360 days      Passed - Valid encounter within last 6 months    Recent Outpatient Visits          1 month ago Annual physical exam   [facility] Reubin Milan, MD   1 year ago Annual physical exam   [facility] Reubin Milan, MD   1 year ago Hypothyroidism due to acquired atrophy of thyroid   [facility] Reubin Milan, MD   2 years ago Streptococcal pharyngitis   [facility] Reubin Milan, MD   2 years ago Hypothyroidism due to acquired atrophy of thyroid   [facility] Reubin Milan, MD      Future Appointments            In 10 months Judithann Graves Nyoka Cowden, MD [facility], [facility]

## 2021-02-28 NOTE — Telephone Encounter (Signed)
Requested medication (s) are due for refill today: yes  Requested medication (s) are on the active medication list: yes  Last refill:  11/05/20  Future visit scheduled: yes  Notes to clinic:  med not delegated to NT to RF   Requested Prescriptions  Pending Prescriptions Disp Refills   ALPRAZolam (XANAX) 0.5 MG tablet [Pharmacy Med Name: ALPRAZOLAM 0.5 MG TABLET] 60 tablet     Sig: TAKE 1 TABLET (0.5 MG TOTAL) BY MOUTH 2 (TWO) TIMES DAILY AS NEEDED FOR UP TO 60 DOSES FOR ANXIETY.      Not Delegated - Psychiatry:  Anxiolytics/Hypnotics Failed - 02/28/2021  9:10 AM      Failed - This refill cannot be delegated      Failed - Urine Drug Screen completed in last 360 days      Passed - Valid encounter within last 6 months    Recent Outpatient Visits           1 month ago Annual physical exam   [facility] Reubin Milan, MD   1 year ago Annual physical exam   [facility] Reubin Milan, MD   1 year ago Hypothyroidism due to acquired atrophy of thyroid   [facility] Reubin Milan, MD   2 years ago Streptococcal pharyngitis   [facility] Reubin Milan, MD   2 years ago Hypothyroidism due to acquired atrophy of thyroid   [facility] Reubin Milan, MD       Future Appointments             In 10 months Judithann Graves Nyoka Cowden, MD [facility], [facility]              Refused Prescriptions Disp Refills   levothyroxine (SYNTHROID) 50 MCG tablet [Pharmacy Med Name: LEVOTHYROXINE 50 MCG TABLET] 90 tablet     Sig: TAKE 1 TABLET BY MOUTH EVERY DAY      Endocrinology:  Hypothyroid Agents Failed - 02/28/2021  9:10 AM      Failed - TSH needs to be rechecked within 3 months after an abnormal result. Refill until TSH is due.      Passed - TSH in normal range and within 360 days    TSH  Date Value Ref Range Status  01/05/2021 1.790 0.450 - 4.500 uIU/mL Final          Passed - Valid encounter within last 12 months     Recent Outpatient Visits           1 month ago Annual physical exam   [facility] Reubin Milan, MD   1 year ago Annual physical exam   [facility] Reubin Milan, MD   1 year ago Hypothyroidism due to acquired atrophy of thyroid   [facility] Reubin Milan, MD   2 years ago Streptococcal pharyngitis   [facility] Reubin Milan, MD   2 years ago Hypothyroidism due to acquired atrophy of thyroid   [facility] Reubin Milan, MD       Future Appointments             In 10 months Judithann Graves Nyoka Cowden, MD [facility], [facility]

## 2021-02-28 NOTE — Progress Notes (Signed)
PROVIDER NOTE: Information contained herein reflects review and annotations entered in association with encounter. Interpretation of such information and data should be left to medically-trained personnel. Information provided to patient can be located elsewhere in the medical record under "Patient Instructions". Document created using STT-dictation technology, any transcriptional errors that may result from process are unintentional.    Patient: Wendy Snyder  Service Category: E/M  Provider: Gaspar Cola, MD  DOB: October 03, 1965  DOS: 03/03/2021  Specialty: Interventional Pain Management  MRN: [id number]  Setting: Ambulatory outpatient  PCP: Glean Hess, MD  Type: Established Patient    Referring Provider: Glean Hess, MD  Location: Office  Delivery: Face-to-face     HPI  [name], a 55 y.o. year old female, is here today because of her Chronic pain syndrome [G89.4]. [name] primary complain today is Back Pain Last encounter: My last encounter with her was on 12/07/2020. Pertinent problems: Ms. Way has Chronic pain syndrome; Presence of functional implant (Rechargable [employer] Neurostimulator) (Cervical Epidural Leads); CRPS (complex regional pain syndrome), type I, upper (Right); Spinal cord stimulator (cervical leads); Chronic right upper extremity pain; RSD (reflex sympathetic dystrophy) (right upper extremity); Chronic low back pain (1ry area of Pain) (Bilateral) (R>L) w/o sciatica; Chronic hip pain (2ry area of Pain) (Right); Chronic wrist pain (3ry area of Pain) (Right) (CRPS); and Chronic right shoulder pain on their pertinent problem list. Pain Assessment: Severity of Chronic pain is reported as a 2 /10. Location: Back Left/Denies. Onset: More than a month ago. Quality: Aching, Shooting, Stabbing. Timing: Intermittent. Modifying factor(s): meds and sitting down. Vitals:  height is 5' 7"  (1.702 m) and weight is 140 lb (63.5 kg). Her temperature is  97.1 F (36.2 C) (abnormal). Her blood pressure is 138/91 (abnormal) and her pulse is 67. Her oxygen saturation is 100%.   Reason for encounter: medication management.   The patient indicates doing well with the current medication regimen. No adverse reactions or side effects reported to the medications.   RTCB: 06/06/2021  Pharmacotherapy Assessment  Analgesic: Hydrocodone/APAP 10/325, 1 tab PO q 4 hrs (60 mg/day of hydrocodone) MME/day: 60 mg/day.   Monitoring: [place] PMP: PDMP reviewed during this encounter.       Pharmacotherapy: No side-effects or adverse reactions reported. Compliance: No problems identified. Effectiveness: Clinically acceptable.  Chauncey Fischer, RN  03/03/2021  8:47 AM  Sign when Signing Visit Safety precautions to be maintained throughout the outpatient stay will include: orient to surroundings, keep bed in low position, maintain call bell within reach at all times, provide assistance with transfer out of bed and ambulation.  Nursing Pain Medication Assessment:  Safety precautions to be maintained throughout the outpatient stay will include: orient to surroundings, keep bed in low position, maintain call bell within reach at all times, provide assistance with transfer out of bed and ambulation.  Medication Inspection Compliance: Pill count conducted under aseptic conditions, in front of the patient. Neither the pills nor the bottle was removed from the patient's sight at any time. Once count was completed pills were immediately returned to the patient in their original bottle.  Medication: Hydrocodone/APAP Pill/Patch Count:  108 of 180 pills remain Pill/Patch Appearance: Markings consistent with prescribed medication Bottle Appearance: Standard pharmacy container. Clearly labeled. Filled Date: 6 / 2 / 2022 Last Medication intake:  Today     UDS:  Summary  Date Value Ref Range Status  12/07/2020 Note  Final    Comment:     ====================================================================  [employer] Select 13 (MW) ==================================================================== Test                             Result       Flag       Units  Drug Present and Declared for Prescription Verification   Hydrocodone                    1314         EXPECTED   ng/mg creat   Hydromorphone                  382          EXPECTED   ng/mg creat   Dihydrocodeine                 217          EXPECTED   ng/mg creat   Norhydrocodone                 >5000        EXPECTED   ng/mg creat    Sources of hydrocodone include scheduled prescription medications.    Hydromorphone, dihydrocodeine and norhydrocodone are expected    metabolites of hydrocodone. Hydromorphone and dihydrocodeine are    also available as scheduled prescription medications.  Drug Absent but Declared for Prescription Verification   Alprazolam                     Not Detected UNEXPECTED ng/mg creat ==================================================================== Test                      Result    Flag   Units      Ref Range   Creatinine              100              mg/dL      >=20 ==================================================================== Declared Medications:  The flagging and interpretation on this report are based on the  following declared medications.  Unexpected results may arise from  inaccuracies in the declared medications.   **Note: The testing scope of this panel includes these medications:   Alprazolam (Xanax)  Hydrocodone (Norco)   **Note: The testing scope of this panel does not include the  following reported medications:   Acetaminophen (Norco)  Acyclovir (Zovirax)  Levothyroxine (Synthroid)  Multivitamin  Naloxone (Narcan)  Naproxen (Aleve)  Valacyclovir (Valtrex)  Venlafaxine (Effexor)  Vitamin D3 ==================================================================== For clinical consultation, please call  [phone]. ====================================================================      ROS  Constitutional: Denies any fever or chills Gastrointestinal: No reported hemesis, hematochezia, vomiting, or acute GI distress Musculoskeletal: Denies any acute onset joint swelling, redness, loss of ROM, or weakness Neurological: No reported episodes of acute onset apraxia, aphasia, dysarthria, agnosia, amnesia, paralysis, loss of coordination, or loss of consciousness  Medication Review  ALPRAZolam, HYDROcodone-acetaminophen, UNABLE TO FIND, Vitamin D3, acyclovir ointment, levothyroxine, multivitamin, naloxone, naproxen sodium, valACYclovir, and venlafaxine XR  History Review  Allergy: [name] is allergic to phenergan [promethazine hcl]. Drug: [name]  reports no history of drug use. Alcohol:  reports no history of alcohol use. Tobacco:  reports that she has never smoked. She has never used smokeless tobacco. Social: [name]  reports that she has never smoked. She has never used smokeless tobacco. She reports that she does not drink alcohol and does not use drugs. Medical:  has a past medical history of Depression, Hypothyroidism, and Thyroid disease. Surgical: [name]  has a past surgical history that includes Spinal cord stimulator implant (2003); Tubal ligation (1990); Fracture surgery (Right); and Breast cyst excision (Left). Family: family history includes Breast cancer in her maternal grandmother; Breast cancer (age of onset: 59) in her mother; Depression in her mother. She was adopted.  Laboratory Chemistry Profile   Renal Lab Results  Component Value Date   BUN 13 01/05/2021   CREATININE 0.85 01/05/2021   BCR 15 01/05/2021   GFRAA 88 12/10/2019   GFRNONAA 76 12/10/2019     Hepatic Lab Results  Component Value Date   AST 21 01/05/2021   ALT 17 01/05/2021   ALBUMIN 4.7 01/05/2021   ALKPHOS 55 01/05/2021     Electrolytes Lab Results  Component Value  Date   NA 143 01/05/2021   K 4.0 01/05/2021   CL 103 01/05/2021   CALCIUM 9.6 01/05/2021   MG 2.1 12/10/2019     Bone Lab Results  Component Value Date   25OHVITD1 84 12/10/2019   25OHVITD2 <1.0 12/10/2019   25OHVITD3 84 12/10/2019     Inflammation (CRP: Acute Phase) (ESR: Chronic Phase) Lab Results  Component Value Date   CRP <1 12/10/2019   ESRSEDRATE 2 12/10/2019       Note: Above Lab results reviewed.  Recent Imaging Review  MM 3D SCREEN BREAST BILATERAL CLINICAL DATA:  Screening.  EXAM: DIGITAL SCREENING BILATERAL MAMMOGRAM WITH TOMOSYNTHESIS AND CAD  TECHNIQUE: Bilateral screening digital craniocaudal and mediolateral oblique mammograms were obtained. Bilateral screening digital breast tomosynthesis was performed. The images were evaluated with computer-aided detection.  COMPARISON:  Previous exam(s).  ACR Breast Density Category c: The breast tissue is heterogeneously dense, which may obscure small masses.  FINDINGS: There are no findings suspicious for malignancy. The images were evaluated with computer-aided detection.  IMPRESSION: No mammographic evidence of malignancy. A result letter of this screening mammogram will be mailed directly to the patient.  RECOMMENDATION: Screening mammogram in one year. (Code:SM-B-01Y)  BI-RADS CATEGORY  1: Negative.  Electronically Signed   By: Nolon Nations M.D.   On: 01/22/2021 12:46 Note: Reviewed        Physical Exam  General appearance: Well nourished, well developed, and well hydrated. In no apparent acute distress Mental status: Alert, oriented x 3 (person, place, & time)       Respiratory: No evidence of acute respiratory distress Eyes: PERLA Vitals: BP (!) 138/91   Pulse 67   Temp (!) 97.1 F (36.2 C)   Ht 5' 7"  (1.702 m)   Wt 140 lb (63.5 kg)   LMP 10/13/2016 (Within Days)   SpO2 100%   BMI 21.93 kg/m  BMI: Estimated body mass index is 21.93 kg/m as calculated from the following:    Height as of this encounter: 5' 7"  (1.702 m).   Weight as of this encounter: 140 lb (63.5 kg). Ideal: Ideal body weight: 61.6 kg (135 lb 12.9 oz) Adjusted ideal body weight: 62.4 kg (137 lb 7.7 oz)  Assessment   Status Diagnosis  Controlled Controlled Controlled 1. Chronic pain syndrome   2. Chronic low back pain (1ry area of Pain) (Bilateral) (R>L) w/o sciatica   3. Chronic hip pain (2ry area of Pain) (Right)   4. Chronic wrist pain (3ry area of Pain) (Right) (CRPS)   5. Complex regional pain syndrome type 1 of right upper extremity   6. Spinal cord stimulator (cervical leads)  7. Pharmacologic therapy   8. Chronic use of opiate for therapeutic purpose      Updated Problems: No problems updated.   Plan of Care  Problem-specific:  No problem-specific Assessment & Plan notes found for this encounter.  Ms. ALANNA STORTI has a current medication list which includes the following long-term medication(s): [START ON 03/08/2021] hydrocodone-acetaminophen, [START ON 04/07/2021] hydrocodone-acetaminophen, [START ON 05/07/2021] hydrocodone-acetaminophen, levothyroxine, naloxone, and venlafaxine xr.  Pharmacotherapy (Medications Ordered): Meds ordered this encounter  Medications   HYDROcodone-acetaminophen (NORCO) 10-325 MG tablet    Sig: Take 1 tablet by mouth every 4 (four) hours as needed for severe pain. Must last 30 days    Dispense:  180 tablet    Refill:  0    Not a duplicate. Do NOT delete! Dispense 1 day early if closed on refill date. Avoid benzodiazepines within 8 hours of opioids. Do not send refill requests.   HYDROcodone-acetaminophen (NORCO) 10-325 MG tablet    Sig: Take 1 tablet by mouth every 4 (four) hours as needed for severe pain. Must last 30 days    Dispense:  180 tablet    Refill:  0    Not a duplicate. Do NOT delete! Dispense 1 day early if closed on refill date. Avoid benzodiazepines within 8 hours of opioids. Do not send refill requests.    HYDROcodone-acetaminophen (NORCO) 10-325 MG tablet    Sig: Take 1 tablet by mouth every 4 (four) hours as needed for severe pain. Must last 30 days    Dispense:  180 tablet    Refill:  0    Not a duplicate. Do NOT delete! Dispense 1 day early if closed on refill date. Avoid benzodiazepines within 8 hours of opioids. Do not send refill requests.    Orders:  No orders of the defined types were placed in this encounter.  Follow-up plan:   Return in about 3 months (around 06/06/2021) for evaluation day (F2F) (MM).     Interventional therapies:  Considering:   Diagnostic bilateral lumbar facet block  Diagnostic IA right hip injection    Palliative PRN treatment(s):   Palliative right stellate ganglion block  Management of Cervical spinal cord stimulator     Recent Visits Date Type Provider Dept  12/07/20 Office Visit Milinda Pointer, MD [facility]  Showing recent visits within past 90 days and meeting all other requirements Today's Visits Date Type Provider Dept  03/03/21 Office Visit Milinda Pointer, MD [facility]  Showing today's visits and meeting all other requirements Future Appointments No visits were found meeting these conditions. Showing future appointments within next 90 days and meeting all other requirements I discussed the assessment and treatment plan with the patient. The patient was provided an opportunity to ask questions and all were answered. The patient agreed with the plan and demonstrated an understanding of the instructions.  Patient advised to call back or seek an in-person evaluation if the symptoms or condition worsens.  Duration of encounter: 30 minutes.  Note by: Gaspar Cola, MD Date: 03/03/2021; Time: 9:08 AM

## 2021-03-01 NOTE — Telephone Encounter (Signed)
Please review. Last office visit 01/05/2021.  KP

## 2021-03-02 DIAGNOSIS — Z79891 Long term (current) use of opiate analgesic: Secondary | ICD-10-CM | POA: Insufficient documentation

## 2021-03-02 NOTE — Patient Instructions (Signed)
____________________________________________________________________________________________  Medication Rules  Purpose: To inform patients, and their family members, of our rules and regulations.  Applies to: All patients receiving prescriptions (written or electronic).  Pharmacy of record: Pharmacy where electronic prescriptions will be sent. If written prescriptions are taken to a different pharmacy, please inform the nursing staff. The pharmacy listed in the electronic medical record should be the one where you would like electronic prescriptions to be sent.  Electronic prescriptions: In compliance with the [place] Strengthen Opioid Misuse Prevention (STOP) Act of 2017 (Session Law 2017-74/H243), effective September 19, 2018, all controlled substances must be electronically prescribed. Calling prescriptions to the pharmacy will cease to exist.  Prescription refills: Only during scheduled appointments. Applies to all prescriptions.  NOTE: The following applies primarily to controlled substances (Opioid* Pain Medications).   Type of encounter (visit): For patients receiving controlled substances, face-to-face visits are required. (Not an option or up to the patient.)  Patient's responsibilities: Pain Pills: Bring all pain pills to every appointment (except for procedure appointments). Pill Bottles: Bring pills in original pharmacy bottle. Always bring the newest bottle. Bring bottle, even if empty. Medication refills: You are responsible for knowing and keeping track of what medications you take and those you need refilled. The day before your appointment: write a list of all prescriptions that need to be refilled. The day of the appointment: give the list to the admitting nurse. Prescriptions will be written only during appointments. No prescriptions will be written on procedure days. If you forget a medication: it will not be "Called in", "Faxed", or "electronically sent". You will  need to get another appointment to get these prescribed. No early refills. Do not call asking to have your prescription filled early. Prescription Accuracy: You are responsible for carefully inspecting your prescriptions before leaving our office. Have the discharge nurse carefully go over each prescription with you, before taking them home. Make sure that your name is accurately spelled, that your address is correct. Check the name and dose of your medication to make sure it is accurate. Check the number of pills, and the written instructions to make sure they are clear and accurate. Make sure that you are given enough medication to last until your next medication refill appointment. Taking Medication: Take medication as prescribed. When it comes to controlled substances, taking less pills or less frequently than prescribed is permitted and encouraged. Never take more pills than instructed. Never take medication more frequently than prescribed.  Inform other Doctors: Always inform, all of your healthcare providers, of all the medications you take. Pain Medication from other Providers: You are not allowed to accept any additional pain medication from any other Doctor or Healthcare provider. There are two exceptions to this rule. (see below) In the event that you require additional pain medication, you are responsible for notifying us, as stated below. Cough Medicine: Often these contain an opioid, such as codeine or hydrocodone. Never accept or take cough medicine containing these opioids if you are already taking an opioid* medication. The combination may cause respiratory failure and death. Medication Agreement: You are responsible for carefully reading and following our Medication Agreement. This must be signed before receiving any prescriptions from our practice. Safely store a copy of your signed Agreement. Violations to the Agreement will result in no further prescriptions. (Additional copies of our  Medication Agreement are available upon request.) Laws, Rules, & Regulations: All patients are expected to follow all Federal and State Laws, Statutes, Rules, & Regulations. Ignorance of   the Laws does not constitute a valid excuse.  Illegal drugs and Controlled Substances: The use of illegal substances (including, but not limited to marijuana and its derivatives) and/or the illegal use of any controlled substances is strictly prohibited. Violation of this rule may result in the immediate and permanent discontinuation of any and all prescriptions being written by our practice. The use of any illegal substances is prohibited. Adopted CDC guidelines & recommendations: Target dosing levels will be at or below 60 MME/day. Use of benzodiazepines** is not recommended.  Exceptions: There are only two exceptions to the rule of not receiving pain medications from other Healthcare Providers. Exception #1 (Emergencies): In the event of an emergency (i.e.: accident requiring emergency care), you are allowed to receive additional pain medication. However, you are responsible for: As soon as you are able, call our office [phone], at any time of the day or night, and leave a message stating your name, the date and nature of the emergency, and the name and dose of the medication prescribed. In the event that your call is answered by a member of our staff, make sure to document and save the date, time, and the name of the person that took your information.  Exception #2 (Planned Surgery): In the event that you are scheduled by another doctor or dentist to have any type of surgery or procedure, you are allowed (for a period no longer than 30 days), to receive additional pain medication, for the acute post-op pain. However, in this case, you are responsible for picking up a copy of our "Post-op Pain Management for Surgeons" handout, and giving it to your surgeon or dentist. This document is available at our office, and  does not require an appointment to obtain it. Simply go to our office during business hours (Monday-Thursday from 8:00 AM to 4:00 PM) (Friday 8:00 AM to 12:00 Noon) or if you have a scheduled appointment with us, prior to your surgery, and ask for it by name. In addition, you are responsible for: calling our office [phone], at any time of the day or night, and leaving a message stating your name, name of your surgeon, type of surgery, and date of procedure or surgery. Failure to comply with your responsibilities may result in termination of therapy involving the controlled substances.  *Opioid medications include: morphine, codeine, oxycodone, oxymorphone, hydrocodone, hydromorphone, meperidine, tramadol, tapentadol, buprenorphine, fentanyl, methadone. **Benzodiazepine medications include: diazepam (Valium), alprazolam (Xanax), clonazepam (Klonopine), lorazepam (Ativan), clorazepate (Tranxene), chlordiazepoxide (Librium), estazolam (Prosom), oxazepam (Serax), temazepam (Restoril), triazolam (Halcion) (Last updated: 08/17/2020) ____________________________________________________________________________________________  ____________________________________________________________________________________________  Medication Recommendations and Reminders  Applies to: All patients receiving prescriptions (written and/or electronic).  Medication Rules & Regulations: These rules and regulations exist for your safety and that of others. They are not flexible and neither are we. Dismissing or ignoring them will be considered "non-compliance" with medication therapy, resulting in complete and irreversible termination of such therapy. (See document titled "Medication Rules" for more details.) In all conscience, because of safety reasons, we cannot continue providing a therapy where the patient does not follow instructions.  Pharmacy of record:  Definition: This is the pharmacy where your electronic  prescriptions will be sent.  We do not endorse any particular pharmacy, however, we have experienced problems with [facility] not securing enough medication supply for the community. We do not restrict you in your choice of pharmacy. However, once we write for your prescriptions, we will NOT be re-sending more prescriptions to fix restricted supply problems   created by your pharmacy, or your insurance.  The pharmacy listed in the electronic medical record should be the one where you want electronic prescriptions to be sent. If you choose to change pharmacy, simply notify our nursing staff.  Recommendations: Keep all of your pain medications in a safe place, under lock and key, even if you live alone. We will NOT replace lost, stolen, or damaged medication. After you fill your prescription, take 1 week's worth of pills and put them away in a safe place. You should keep a separate, properly labeled bottle for this purpose. The remainder should be kept in the original bottle. Use this as your primary supply, until it runs out. Once it's gone, then you know that you have 1 week's worth of medicine, and it is time to come in for a prescription refill. If you do this correctly, it is unlikely that you will ever run out of medicine. To make sure that the above recommendation works, it is very important that you make sure your medication refill appointments are scheduled at least 1 week before you run out of medicine. To do this in an effective manner, make sure that you do not leave the office without scheduling your next medication management appointment. Always ask the nursing staff to show you in your prescription , when your medication will be running out. Then arrange for the receptionist to get you a return appointment, at least 7 days before you run out of medicine. Do not wait until you have 1 or 2 pills left, to come in. This is very poor planning and does not take into consideration that we may need to  cancel appointments due to bad weather, sickness, or emergencies affecting our staff. DO NOT ACCEPT A "Partial Fill": If for any reason your pharmacy does not have enough pills/tablets to completely fill or refill your prescription, do not allow for a "partial fill". The law allows the pharmacy to complete that prescription within 72 hours, without requiring a new prescription. If they do not fill the rest of your prescription within those 72 hours, you will need a separate prescription to fill the remaining amount, which we will NOT provide. If the reason for the partial fill is your insurance, you will need to talk to the pharmacist about payment alternatives for the remaining tablets, but again, DO NOT ACCEPT A PARTIAL FILL, unless you can trust your pharmacist to obtain the remainder of the pills within 72 hours.  Prescription refills and/or changes in medication(s):  Prescription refills, and/or changes in dose or medication, will be conducted only during scheduled medication management appointments. (Applies to both, written and electronic prescriptions.) No refills on procedure days. No medication will be changed or started on procedure days. No changes, adjustments, and/or refills will be conducted on a procedure day. Doing so will interfere with the diagnostic portion of the procedure. No phone refills. No medications will be "called into the pharmacy". No Fax refills. No weekend refills. No Holliday refills. No after hours refills.  Remember:  Business hours are:  Monday to Thursday 8:00 AM to 4:00 PM Provider's Schedule: Irelyn Perfecto, MD - Appointments are:  Medication management: Monday and Wednesday 8:00 AM to 4:00 PM Procedure day: Tuesday and Thursday 7:30 AM to 4:00 PM Bilal Lateef, MD - Appointments are:  Medication management: Tuesday and Thursday 8:00 AM to 4:00 PM Procedure day: Monday and Wednesday 7:30 AM to 4:00 PM (Last update:  04/08/2020) ____________________________________________________________________________________________  ____________________________________________________________________________________________  CBD (cannabidiol) WARNING    Applicable to: All individuals currently taking or considering taking CBD (cannabidiol) and, more important, all patients taking opioid analgesic controlled substances (pain medication). (Example: oxycodone; oxymorphone; hydrocodone; hydromorphone; morphine; methadone; tramadol; tapentadol; fentanyl; buprenorphine; butorphanol; dextromethorphan; meperidine; codeine; etc.)  Legal status: CBD remains a Schedule I drug prohibited for any use. CBD is illegal with one exception. In the [place], CBD has a limited Food and Drug Administration (FDA) approval for the treatment of two specific types of epilepsy disorders. Only one CBD product has been approved by the FDA for this purpose: "Epidiolex". FDA is aware that some companies are marketing products containing cannabis and cannabis-derived compounds in ways that violate the Federal Food, Drug and Cosmetic Act (FD&C Act) and that may put the health and safety of consumers at risk. The FDA, a Federal agency, has not enforced the CBD status since 2018.   Legality: Some manufacturers ship CBD products nationally, which is illegal. Often such products are sold online and are therefore available throughout the country. CBD is openly sold in head shops and health food stores in some states where such sales have not been explicitly legalized. Selling unapproved products with unsubstantiated therapeutic claims is not only a violation of the law, but also can put patients at risk, as these products have not been proven to be safe or effective. Federal illegality makes it difficult to conduct research on CBD.  Reference: "FDA Regulation of Cannabis and Cannabis-Derived Products, Including Cannabidiol (CBD)" -  https://www.fda.gov/news-events/public-health-focus/fda-regulation-cannabis-and-cannabis-derived-products-including-cannabidiol-cbd  Warning: CBD is not FDA approved and has not undergo the same manufacturing controls as prescription drugs.  This means that the purity and safety of available CBD may be questionable. Most of the time, despite manufacturer's claims, it is contaminated with THC (delta-9-tetrahydrocannabinol - the chemical in marijuana responsible for the "HIGH").  When this is the case, the THC contaminant will trigger a positive urine drug screen (UDS) test for Marijuana (carboxy-THC). Because a positive UDS for any illicit substance is a violation of our medication agreement, your opioid analgesics (pain medicine) may be permanently discontinued.  MORE ABOUT CBD  General Information: CBD  is a derivative of the Marijuana (cannabis sativa) plant discovered in 1940. It is one of the 113 identified substances found in Marijuana. It accounts for up to 40% of the plant's extract. As of 2018, preliminary clinical studies on CBD included research for the treatment of anxiety, movement disorders, and pain. CBD is available and consumed in multiple forms, including inhalation of smoke or vapor, as an aerosol spray, and by mouth. It may be supplied as an oil containing CBD, capsules, dried cannabis, or as a liquid solution. CBD is thought not to be as psychoactive as THC (delta-9-tetrahydrocannabinol - the chemical in marijuana responsible for the "HIGH"). Studies suggest that CBD may interact with different biological target receptors in the body, including cannabinoid and other neurotransmitter receptors. As of 2018 the mechanism of action for its biological effects has not been determined.  Side-effects  Adverse reactions: Dry mouth, diarrhea, decreased appetite, fatigue, drowsiness, malaise, weakness, sleep disturbances, and others.  Drug interactions: CBC may interact with other medications  such as blood-thinners. (Last update: 04/25/2020) ____________________________________________________________________________________________  ____________________________________________________________________________________________  Drug Holidays (Slow)  What is a "Drug Holiday"? Drug Holiday: is the name given to the period of time during which a patient stops taking a medication(s) for the purpose of eliminating tolerance to the drug.  Benefits Improved effectiveness of opioids. Decreased opioid dose needed to achieve benefits. Improved pain with lesser dose.    What is tolerance? Tolerance: is the progressive decreased in effectiveness of a drug due to its repetitive use. With repetitive use, the body gets use to the medication and as a consequence, it loses its effectiveness. This is a common problem seen with opioid pain medications. As a result, a larger dose of the drug is needed to achieve the same effect that used to be obtained with a smaller dose.  How long should a "Drug Holiday" last? You should stay off of the pain medicine for at least 14 consecutive days. (2 weeks)  Should I stop the medicine "cold turkey"? No. You should always coordinate with your Pain Specialist so that he/she can provide you with the correct medication dose to make the transition as smoothly as possible.  How do I stop the medicine? Slowly. You will be instructed to decrease the daily amount of pills that you take by one (1) pill every seven (7) days. This is called a "slow downward taper" of your dose. For example: if you normally take four (4) pills per day, you will be asked to drop this dose to three (3) pills per day for seven (7) days, then to two (2) pills per day for seven (7) days, then to one (1) per day for seven (7) days, and at the end of those last seven (7) days, this is when the "Drug Holiday" would start.   Will I have withdrawals? By doing a "slow downward taper" like this one, it  is unlikely that you will experience any significant withdrawal symptoms. Typically, what triggers withdrawals is the sudden stop of a high dose opioid therapy. Withdrawals can usually be avoided by slowly decreasing the dose over a prolonged period of time. If you do not follow these instructions and decide to stop your medication abruptly, withdrawals may be possible.  What are withdrawals? Withdrawals: refers to the wide range of symptoms that occur after stopping or dramatically reducing opiate drugs after heavy and prolonged use. Withdrawal symptoms do not occur to patients that use low dose opioids, or those who take the medication sporadically. Contrary to benzodiazepine (example: Valium, Xanax, etc.) or alcohol withdrawals ("Delirium Tremens"), opioid withdrawals are not lethal. Withdrawals are the physical manifestation of the body getting rid of the excess receptors.  Expected Symptoms Early symptoms of withdrawal may include: Agitation Anxiety Muscle aches Increased tearing Insomnia Runny nose Sweating Yawning  Late symptoms of withdrawal may include: Abdominal cramping Diarrhea Dilated pupils Goose bumps Nausea Vomiting  Will I experience withdrawals? Due to the slow nature of the taper, it is very unlikely that you will experience any.  What is a slow taper? Taper: refers to the gradual decrease in dose.  (Last update: 04/08/2020) ____________________________________________________________________________________________    

## 2021-03-03 ENCOUNTER — Other Ambulatory Visit: Payer: Self-pay

## 2021-03-03 ENCOUNTER — Ambulatory Visit: Payer: 59 | Attending: Pain Medicine | Admitting: Pain Medicine

## 2021-03-03 ENCOUNTER — Encounter: Payer: Self-pay | Admitting: Pain Medicine

## 2021-03-03 VITALS — BP 138/91 | HR 67 | Temp 97.1°F | Ht 67.0 in | Wt 140.0 lb

## 2021-03-03 DIAGNOSIS — M545 Low back pain, unspecified: Secondary | ICD-10-CM | POA: Diagnosis not present

## 2021-03-03 DIAGNOSIS — G894 Chronic pain syndrome: Secondary | ICD-10-CM | POA: Diagnosis not present

## 2021-03-03 DIAGNOSIS — Z9689 Presence of other specified functional implants: Secondary | ICD-10-CM

## 2021-03-03 DIAGNOSIS — M25531 Pain in right wrist: Secondary | ICD-10-CM | POA: Diagnosis not present

## 2021-03-03 DIAGNOSIS — G8929 Other chronic pain: Secondary | ICD-10-CM

## 2021-03-03 DIAGNOSIS — M25551 Pain in right hip: Secondary | ICD-10-CM

## 2021-03-03 DIAGNOSIS — G90511 Complex regional pain syndrome I of right upper limb: Secondary | ICD-10-CM

## 2021-03-03 DIAGNOSIS — Z79891 Long term (current) use of opiate analgesic: Secondary | ICD-10-CM

## 2021-03-03 DIAGNOSIS — Z79899 Other long term (current) drug therapy: Secondary | ICD-10-CM

## 2021-03-03 MED ORDER — HYDROCODONE-ACETAMINOPHEN 10-325 MG PO TABS: 1.0000 | ORAL_TABLET | ORAL | 0 refills | Status: DC | PRN

## 2021-03-03 NOTE — Progress Notes (Signed)
Safety precautions to be maintained throughout the outpatient stay will include: orient to surroundings, keep bed in low position, maintain call bell within reach at all times, provide assistance with transfer out of bed and ambulation.  Nursing Pain Medication Assessment:  Safety precautions to be maintained throughout the outpatient stay will include: orient to surroundings, keep bed in low position, maintain call bell within reach at all times, provide assistance with transfer out of bed and ambulation.  Medication Inspection Compliance: Pill count conducted under aseptic conditions, in front of the patient. Neither the pills nor the bottle was removed from the patient's sight at any time. Once count was completed pills were immediately returned to the patient in their original bottle.  Medication: Hydrocodone/APAP Pill/Patch Count:  108 of 180 pills remain Pill/Patch Appearance: Markings consistent with prescribed medication Bottle Appearance: Standard pharmacy container. Clearly labeled. Filled Date: 6 / 2 / 2022 Last Medication intake:  Today

## 2021-03-28 ENCOUNTER — Other Ambulatory Visit: Payer: Self-pay | Admitting: Internal Medicine

## 2021-03-28 DIAGNOSIS — E034 Atrophy of thyroid (acquired): Secondary | ICD-10-CM

## 2021-03-28 NOTE — Telephone Encounter (Signed)
Future visit in 9 months  

## 2021-04-29 ENCOUNTER — Other Ambulatory Visit: Payer: Self-pay | Admitting: Internal Medicine

## 2021-04-29 DIAGNOSIS — F064 Anxiety disorder due to known physiological condition: Secondary | ICD-10-CM

## 2021-04-29 NOTE — Telephone Encounter (Signed)
Please review. Last office visit 01/05/21.  KP

## 2021-04-29 NOTE — Telephone Encounter (Signed)
Requested medication (s) are due for refill today: yes  Requested medication (s) are on the active medication list: yes  Last refill:  03/28/2021  Future visit scheduled: yes   Notes to clinic:  this refill cannot be delegated    Requested Prescriptions  Pending Prescriptions Disp Refills   ALPRAZolam (XANAX) 0.5 MG tablet [Pharmacy Med Name: ALPRAZOLAM 0.5 MG TABLET] 60 tablet 1    Sig: TAKE 1 TABLET (0.5 MG TOTAL) BY MOUTH 2 (TWO) TIMES DAILY AS NEEDED FOR UP TO 60 DOSES FOR ANXIETY.     Not Delegated - Psychiatry:  Anxiolytics/Hypnotics Failed - 04/29/2021 10:21 AM      Failed - This refill cannot be delegated      Failed - Urine Drug Screen completed in last 360 days      Passed - Valid encounter within last 6 months    Recent Outpatient Visits           3 months ago Annual physical exam   [facility] Reubin Milan, MD   1 year ago Annual physical exam   [facility] Reubin Milan, MD   1 year ago Hypothyroidism due to acquired atrophy of thyroid   [facility] Reubin Milan, MD   2 years ago Streptococcal pharyngitis   [facility] Reubin Milan, MD   2 years ago Hypothyroidism due to acquired atrophy of thyroid   [facility] Reubin Milan, MD       Future Appointments             In 8 months Judithann Graves Nyoka Cowden, MD [facility]

## 2021-05-31 ENCOUNTER — Other Ambulatory Visit: Payer: Self-pay | Admitting: Internal Medicine

## 2021-05-31 DIAGNOSIS — F064 Anxiety disorder due to known physiological condition: Secondary | ICD-10-CM

## 2021-06-01 NOTE — Progress Notes (Signed)
PROVIDER NOTE: Information contained herein reflects review and annotations entered in association with encounter. Interpretation of such information and data should be left to medically-trained personnel. Information provided to patient can be located elsewhere in the medical record under "Patient Instructions". Document created using STT-dictation technology, any transcriptional errors that may result from process are unintentional.    Patient: Wendy Snyder  Service Category: E/M  Provider: Gaspar Cola, MD  DOB: 11-16-1965  DOS: 06/02/2021  Specialty: Interventional Pain Management  MRN: [id number]  Setting: Ambulatory outpatient  PCP: Glean Hess, MD  Type: Established Patient    Referring Provider: Glean Hess, MD  Location: Office  Delivery: Face-to-face     HPI  Wendy Snyder, a 55 y.o. year old female, is here today because of her Chronic bilateral low back pain without sciatica [M54.50, G89.29]. Ms. Bubolz primary complain today is Back Pain (Lumbar bilateral left is worse. ) Last encounter: My last encounter with her was on 03/03/2021. Pertinent problems: Wendy Snyder has Chronic pain syndrome; Presence of functional implant (Rechargable [employer] Neurostimulator) (Cervical Epidural Leads); Spinal cord stimulator in situ (cervical leads); Chronic upper extremity pain (Right); RSD (reflex sympathetic dystrophy) (right upper extremity); Chronic low back pain (1ry area of Pain) (Bilateral) (R>L) w/o sciatica; Chronic hip pain (2ry area of Pain) (Right); Chronic wrist pain (3ry area of Pain) (Right) (CRPS); Chronic shoulder pain (Right); and CRPS (complex regional pain syndrome), type I, upper (Right) on their pertinent problem list. Pain Assessment: Severity of Chronic pain is reported as a 2 /10. Location: Back Lower, Left, Right/not currently. Onset: More than a month ago. Quality: Discomfort, Constant. Timing: Constant. Modifying factor(s): stretching,  medications.. Vitals:  height is 5' 7"  (1.702 m) and weight is 140 lb (63.5 kg). Her temporal temperature is 97 F (36.1 C) (abnormal). Her blood pressure is 123/82 and her pulse is 70. Her respiration is 16 and oxygen saturation is 100%.   Reason for encounter: medication management.   The patient indicates doing well with the current medication regimen. No adverse reactions or side effects reported to the medications.   RTCB: 09/04/2021  Pharmacotherapy Assessment  Analgesic: Hydrocodone/APAP 10/325, 1 tab PO q 4 hrs (60 mg/day of hydrocodone) MME/day: 60 mg/day.   Monitoring: [place] PMP: PDMP reviewed during this encounter.       Pharmacotherapy: No side-effects or adverse reactions reported. Compliance: No problems identified. Effectiveness: Clinically acceptable.  No notes on file  UDS:  Summary  Date Value Ref Range Status  12/07/2020 Note  Final    Comment:    ==================================================================== [employer] [facility] (MW) ==================================================================== Test                             Result       Flag       Units  Drug Present and Declared for Prescription Verification   Hydrocodone                    1314         EXPECTED   ng/mg creat   Hydromorphone                  382          EXPECTED   ng/mg creat   Dihydrocodeine                 217  EXPECTED   ng/mg creat   Norhydrocodone                 >5000        EXPECTED   ng/mg creat    Sources of hydrocodone include scheduled prescription medications.    Hydromorphone, dihydrocodeine and norhydrocodone are expected    metabolites of hydrocodone. Hydromorphone and dihydrocodeine are    also available as scheduled prescription medications.  Drug Absent but Declared for Prescription Verification   Alprazolam                     Not Detected UNEXPECTED ng/mg creat ==================================================================== Test                       Result    Flag   Units      Ref Range   Creatinine              100              mg/dL      >=20 ==================================================================== Declared Medications:  The flagging and interpretation on this report are based on the  following declared medications.  Unexpected results may arise from  inaccuracies in the declared medications.   **Note: The testing scope of this panel includes these medications:   Alprazolam (Xanax)  Hydrocodone (Norco)   **Note: The testing scope of this panel does not include the  following reported medications:   Acetaminophen (Norco)  Acyclovir (Zovirax)  Levothyroxine (Synthroid)  Multivitamin  Naloxone (Narcan)  Naproxen (Aleve)  Valacyclovir (Valtrex)  Venlafaxine (Effexor)  Vitamin D3 ==================================================================== For clinical consultation, please call [phone]. ====================================================================      ROS  Constitutional: Denies any fever or chills Gastrointestinal: No reported hemesis, hematochezia, vomiting, or acute GI distress Musculoskeletal: Denies any acute onset joint swelling, redness, loss of ROM, or weakness Neurological: No reported episodes of acute onset apraxia, aphasia, dysarthria, agnosia, amnesia, paralysis, loss of coordination, or loss of consciousness  Medication Review  ALPRAZolam, HYDROcodone-acetaminophen, Vitamin D3, acyclovir ointment, levothyroxine, multivitamin, naloxone, naproxen sodium, valACYclovir, and venlafaxine XR  History Review  Allergy: Wendy Snyder is allergic to phenergan [promethazine hcl]. Drug: Wendy Snyder  reports no history of drug use. Alcohol:  reports no history of alcohol use. Tobacco:  reports that she has never smoked. She has never used smokeless tobacco. Social: Wendy Snyder  reports that she has never smoked. She has never used smokeless tobacco. She reports that she does not  drink alcohol and does not use drugs. Medical:  has a past medical history of Depression, Hypothyroidism, and Thyroid disease. Surgical: Wendy Snyder  has a past surgical history that includes Spinal cord stimulator implant (2003); Tubal ligation (1990); Fracture surgery (Right); and Breast cyst excision (Left). Family: family history includes Breast cancer in her maternal grandmother; Breast cancer (age of onset: 49) in her mother; Depression in her mother. She was adopted.  Laboratory Chemistry Profile   Renal Lab Results  Component Value Date   BUN 13 01/05/2021   CREATININE 0.85 01/05/2021   BCR 15 01/05/2021   GFRAA 88 12/10/2019   GFRNONAA 76 12/10/2019    Hepatic Lab Results  Component Value Date   AST 21 01/05/2021   ALT 17 01/05/2021   ALBUMIN 4.7 01/05/2021   ALKPHOS 55 01/05/2021    Electrolytes Lab Results  Component Value Date   NA 143 01/05/2021   K 4.0 01/05/2021   CL 103 01/05/2021  CALCIUM 9.6 01/05/2021   MG 2.1 12/10/2019    Bone Lab Results  Component Value Date   25OHVITD1 84 12/10/2019   25OHVITD2 <1.0 12/10/2019   25OHVITD3 84 12/10/2019    Inflammation (CRP: Acute Phase) (ESR: Chronic Phase) Lab Results  Component Value Date   CRP <1 12/10/2019   ESRSEDRATE 2 12/10/2019         Note: Above Lab results reviewed.  Recent Imaging Review  MM 3D SCREEN BREAST BILATERAL CLINICAL DATA:  Screening.  EXAM: DIGITAL SCREENING BILATERAL MAMMOGRAM WITH TOMOSYNTHESIS AND CAD  TECHNIQUE: Bilateral screening digital craniocaudal and mediolateral oblique mammograms were obtained. Bilateral screening digital breast tomosynthesis was performed. The images were evaluated with computer-aided detection.  COMPARISON:  Previous exam(s).  ACR Breast Density Category c: The breast tissue is heterogeneously dense, which may obscure small masses.  FINDINGS: There are no findings suspicious for malignancy. The images were evaluated with computer-aided  detection.  IMPRESSION: No mammographic evidence of malignancy. A result letter of this screening mammogram will be mailed directly to the patient.  RECOMMENDATION: Screening mammogram in one year. (Code:SM-B-01Y)  BI-RADS CATEGORY  1: Negative.  Electronically Signed   By: Nolon Nations M.D.   On: 01/22/2021 12:46 Note: Reviewed        Physical Exam  General appearance: Well nourished, well developed, and well hydrated. In no apparent acute distress Mental status: Alert, oriented x 3 (person, place, & time)       Respiratory: No evidence of acute respiratory distress Eyes: PERLA Vitals: BP 123/82 (BP Location: Left Arm, Patient Position: Sitting, Cuff Size: Normal)   Pulse 70   Temp (!) 97 F (36.1 C) (Temporal)   Resp 16   Ht 5' 7"  (1.702 m)   Wt 140 lb (63.5 kg)   LMP 10/13/2016 (Within Days)   SpO2 100%   BMI 21.93 kg/m  BMI: Estimated body mass index is 21.93 kg/m as calculated from the following:   Height as of this encounter: 5' 7"  (1.702 m).   Weight as of this encounter: 140 lb (63.5 kg). Ideal: Ideal body weight: 61.6 kg (135 lb 12.9 oz) Adjusted ideal body weight: 62.4 kg (137 lb 7.7 oz)  Assessment   Status Diagnosis  Controlled Controlled Controlled 1. Chronic low back pain (1ry area of Pain) (Bilateral) (R>L) w/o sciatica   2. Chronic hip pain (2ry area of Pain) (Right)   3. Chronic shoulder pain (Right)   4. Chronic upper extremity pain (Right)   5. Chronic wrist pain (3ry area of Pain) (Right) (CRPS)   6. CRPS (complex regional pain syndrome), type I, upper (Right)   7. Spinal cord stimulator in situ (cervical leads)   8. Chronic pain syndrome   9. Pharmacologic therapy   10. Chronic use of opiate for therapeutic purpose   11. Encounter for medication management   12. Complex regional pain syndrome type 1 of right upper extremity      Updated Problems: Problem  CRPS (complex regional pain syndrome), type I, upper (Right)  Chronic  shoulder pain (Right)  Spinal cord stimulator in situ (cervical leads)  Chronic upper extremity pain (Right)    Plan of Care  Problem-specific:  No problem-specific Assessment & Plan notes found for this encounter.  Ms. MINHA FULCO has a current medication list which includes the following long-term medication(s): [START ON 06/06/2021] hydrocodone-acetaminophen, [START ON 07/06/2021] hydrocodone-acetaminophen, [START ON 08/05/2021] hydrocodone-acetaminophen, levothyroxine, naloxone, and venlafaxine xr.  Pharmacotherapy (Medications Ordered): Meds ordered this encounter  Medications   HYDROcodone-acetaminophen (NORCO) 10-325 MG tablet    Sig: Take 1 tablet by mouth every 4 (four) hours as needed for severe pain. Must last 30 days    Dispense:  180 tablet    Refill:  0    Not a duplicate. Do NOT delete! Dispense 1 day early if closed on refill date. Avoid benzodiazepines within 8 hours of opioids. Do not send refill requests.   HYDROcodone-acetaminophen (NORCO) 10-325 MG tablet    Sig: Take 1 tablet by mouth every 4 (four) hours as needed for severe pain. Must last 30 days    Dispense:  180 tablet    Refill:  0    Not a duplicate. Do NOT delete! Dispense 1 day early if closed on refill date. Avoid benzodiazepines within 8 hours of opioids. Do not send refill requests.   HYDROcodone-acetaminophen (NORCO) 10-325 MG tablet    Sig: Take 1 tablet by mouth every 4 (four) hours as needed for severe pain. Must last 30 days    Dispense:  180 tablet    Refill:  0    Not a duplicate. Do NOT delete! Dispense 1 day early if closed on refill date. Avoid benzodiazepines within 8 hours of opioids. Do not send refill requests.   Orders:  No orders of the defined types were placed in this encounter.  Follow-up plan:   Return in about 3 months (around 09/04/2021) for Eval-day (M,W), (F2F), (MM).     Interventional therapies:  Considering:   Diagnostic bilateral lumbar facet block  Diagnostic  IA right hip injection    Palliative PRN treatment(s):   Palliative right stellate ganglion block  Management of Cervical spinal cord stimulator     Recent Visits No visits were found meeting these conditions. Showing recent visits within past 90 days and meeting all other requirements Today's Visits Date Type Provider Dept  06/02/21 Office Visit Milinda Pointer, MD [facility]  Showing today's visits and meeting all other requirements Future Appointments No visits were found meeting these conditions. Showing future appointments within next 90 days and meeting all other requirements I discussed the assessment and treatment plan with the patient. The patient was provided an opportunity to ask questions and all were answered. The patient agreed with the plan and demonstrated an understanding of the instructions.  Patient advised to call back or seek an in-person evaluation if the symptoms or condition worsens.  Duration of encounter: 30 minutes.  Note by: Gaspar Cola, MD Date: 06/02/2021; Time: 9:35 AM

## 2021-06-02 ENCOUNTER — Other Ambulatory Visit: Payer: Self-pay

## 2021-06-02 ENCOUNTER — Encounter: Payer: Self-pay | Admitting: Pain Medicine

## 2021-06-02 ENCOUNTER — Ambulatory Visit: Payer: 59 | Attending: Pain Medicine | Admitting: Pain Medicine

## 2021-06-02 VITALS — BP 123/82 | HR 70 | Temp 97.0°F | Resp 16 | Ht 67.0 in | Wt 140.0 lb

## 2021-06-02 DIAGNOSIS — M545 Low back pain, unspecified: Secondary | ICD-10-CM | POA: Diagnosis not present

## 2021-06-02 DIAGNOSIS — M79601 Pain in right arm: Secondary | ICD-10-CM | POA: Insufficient documentation

## 2021-06-02 DIAGNOSIS — M25531 Pain in right wrist: Secondary | ICD-10-CM | POA: Diagnosis present

## 2021-06-02 DIAGNOSIS — Z79899 Other long term (current) drug therapy: Secondary | ICD-10-CM | POA: Diagnosis present

## 2021-06-02 DIAGNOSIS — M25551 Pain in right hip: Secondary | ICD-10-CM | POA: Diagnosis not present

## 2021-06-02 DIAGNOSIS — Z9689 Presence of other specified functional implants: Secondary | ICD-10-CM | POA: Insufficient documentation

## 2021-06-02 DIAGNOSIS — G90511 Complex regional pain syndrome I of right upper limb: Secondary | ICD-10-CM | POA: Diagnosis present

## 2021-06-02 DIAGNOSIS — G894 Chronic pain syndrome: Secondary | ICD-10-CM | POA: Insufficient documentation

## 2021-06-02 DIAGNOSIS — Z79891 Long term (current) use of opiate analgesic: Secondary | ICD-10-CM | POA: Diagnosis present

## 2021-06-02 DIAGNOSIS — G8929 Other chronic pain: Secondary | ICD-10-CM | POA: Insufficient documentation

## 2021-06-02 DIAGNOSIS — M25511 Pain in right shoulder: Secondary | ICD-10-CM | POA: Insufficient documentation

## 2021-06-02 MED ORDER — HYDROCODONE-ACETAMINOPHEN 10-325 MG PO TABS: 1.0000 | ORAL_TABLET | ORAL | 0 refills | Status: DC | PRN

## 2021-06-02 NOTE — Progress Notes (Signed)
Nursing Pain Medication Assessment:  Safety precautions to be maintained throughout the outpatient stay will include: orient to surroundings, keep bed in low position, maintain call bell within reach at all times, provide assistance with transfer out of bed and ambulation.  Medication Inspection Compliance: Pill count conducted under aseptic conditions, in front of the patient. Neither the pills nor the bottle was removed from the patient's sight at any time. Once count was completed pills were immediately returned to the patient in their original bottle.  Medication: Hydrocodone/APAP Pill/Patch Count:  129 of 180 pills remain Pill/Patch Appearance: Markings consistent with prescribed medication Bottle Appearance: Standard pharmacy container. Clearly labeled. Filled Date: 09 / 03 / 2022 Last Medication intake:  Today

## 2021-06-02 NOTE — Patient Instructions (Signed)
____________________________________________________________________________________________  Medication Rules  Purpose: To inform patients, and their family members, of our rules and regulations.  Applies to: All patients receiving prescriptions (written or electronic).  Pharmacy of record: Pharmacy where electronic prescriptions will be sent. If written prescriptions are taken to a different pharmacy, please inform the nursing staff. The pharmacy listed in the electronic medical record should be the one where you would like electronic prescriptions to be sent.  Electronic prescriptions: In compliance with the [place] [employer] Opioid Misuse Prevention (STOP) Act of 2017 (Session Law 2017-74/H243), effective September 19, 2018, all controlled substances must be electronically prescribed. Calling prescriptions to the pharmacy will cease to exist.  Prescription refills: Only during scheduled appointments. Applies to all prescriptions.  NOTE: The following applies primarily to controlled substances (Opioid* Pain Medications).   Type of encounter (visit): For patients receiving controlled substances, face-to-face visits are required. (Not an option or up to the patient.)  Patient's responsibilities: Pain Pills: Bring all pain pills to every appointment (except for procedure appointments). Pill Bottles: Bring pills in original pharmacy bottle. Always bring the newest bottle. Bring bottle, even if empty. Medication refills: You are responsible for knowing and keeping track of what medications you take and those you need refilled. The day before your appointment: write a list of all prescriptions that need to be refilled. The day of the appointment: give the list to the admitting nurse. Prescriptions will be written only during appointments. No prescriptions will be written on procedure days. If you forget a medication: it will not be "Called in", "Faxed", or "electronically sent". You will  need to get another appointment to get these prescribed. No early refills. Do not call asking to have your prescription filled early. Prescription Accuracy: You are responsible for carefully inspecting your prescriptions before leaving our office. Have the discharge nurse carefully go over each prescription with you, before taking them home. Make sure that your name is accurately spelled, that your address is correct. Check the name and dose of your medication to make sure it is accurate. Check the number of pills, and the written instructions to make sure they are clear and accurate. Make sure that you are given enough medication to last until your next medication refill appointment. Taking Medication: Take medication as prescribed. When it comes to controlled substances, taking less pills or less frequently than prescribed is permitted and encouraged. Never take more pills than instructed. Never take medication more frequently than prescribed.  Inform other Doctors: Always inform, all of your healthcare providers, of all the medications you take. Pain Medication from other Providers: You are not allowed to accept any additional pain medication from any other Doctor or Healthcare provider. There are two exceptions to this rule. (see below) In the event that you require additional pain medication, you are responsible for notifying us, as stated below. Cough Medicine: Often these contain an opioid, such as codeine or hydrocodone. Never accept or take cough medicine containing these opioids if you are already taking an opioid* medication. The combination may cause respiratory failure and death. Medication Agreement: You are responsible for carefully reading and following our Medication Agreement. This must be signed before receiving any prescriptions from our practice. Safely store a copy of your signed Agreement. Violations to the Agreement will result in no further prescriptions. (Additional copies of our  Medication Agreement are available upon request.) Laws, Rules, & Regulations: All patients are expected to follow all Federal and State Laws, Statutes, Rules, & Regulations. Ignorance of   the Laws does not constitute a valid excuse.  Illegal drugs and Controlled Substances: The use of illegal substances (including, but not limited to marijuana and its derivatives) and/or the illegal use of any controlled substances is strictly prohibited. Violation of this rule may result in the immediate and permanent discontinuation of any and all prescriptions being written by our practice. The use of any illegal substances is prohibited. Adopted CDC guidelines & recommendations: Target dosing levels will be at or below 60 MME/day. Use of benzodiazepines** is not recommended.  Exceptions: There are only two exceptions to the rule of not receiving pain medications from other Healthcare Providers. Exception #1 (Emergencies): In the event of an emergency (i.e.: accident requiring emergency care), you are allowed to receive additional pain medication. However, you are responsible for: As soon as you are able, call our office [phone], at any time of the day or night, and leave a message stating your name, the date and nature of the emergency, and the name and dose of the medication prescribed. In the event that your call is answered by a member of our staff, make sure to document and save the date, time, and the name of the person that took your information.  Exception #2 (Planned Surgery): In the event that you are scheduled by another doctor or dentist to have any type of surgery or procedure, you are allowed (for a period no longer than 30 days), to receive additional pain medication, for the acute post-op pain. However, in this case, you are responsible for picking up a copy of our "Post-op Pain Management for Surgeons" handout, and giving it to your surgeon or dentist. This document is available at our office, and  does not require an appointment to obtain it. Simply go to our office during business hours (Monday-Thursday from 8:00 AM to 4:00 PM) (Friday 8:00 AM to 12:00 Noon) or if you have a scheduled appointment with us, prior to your surgery, and ask for it by name. In addition, you are responsible for: calling our office [phone], at any time of the day or night, and leaving a message stating your name, name of your surgeon, type of surgery, and date of procedure or surgery. Failure to comply with your responsibilities may result in termination of therapy involving the controlled substances.  *Opioid medications include: morphine, codeine, oxycodone, oxymorphone, hydrocodone, hydromorphone, meperidine, tramadol, tapentadol, buprenorphine, fentanyl, methadone. **Benzodiazepine medications include: diazepam (Valium), alprazolam (Xanax), clonazepam (Klonopine), lorazepam (Ativan), clorazepate (Tranxene), chlordiazepoxide (Librium), estazolam (Prosom), oxazepam (Serax), temazepam (Restoril), triazolam (Halcion) (Last updated: 08/17/2020) ____________________________________________________________________________________________  ____________________________________________________________________________________________  Medication Recommendations and Reminders  Applies to: All patients receiving prescriptions (written and/or electronic).  Medication Rules & Regulations: These rules and regulations exist for your safety and that of others. They are not flexible and neither are we. Dismissing or ignoring them will be considered "non-compliance" with medication therapy, resulting in complete and irreversible termination of such therapy. (See document titled "Medication Rules" for more details.) In all conscience, because of safety reasons, we cannot continue providing a therapy where the patient does not follow instructions.  Pharmacy of record:  Definition: This is the pharmacy where your electronic  prescriptions will be sent.  We do not endorse any particular pharmacy, however, we have experienced problems with [facility] not securing enough medication supply for the community. We do not restrict you in your choice of pharmacy. However, once we write for your prescriptions, we will NOT be re-sending more prescriptions to fix restricted supply problems   created by your pharmacy, or your insurance.  The pharmacy listed in the electronic medical record should be the one where you want electronic prescriptions to be sent. If you choose to change pharmacy, simply notify our nursing staff.  Recommendations: Keep all of your pain medications in a safe place, under lock and key, even if you live alone. We will NOT replace lost, stolen, or damaged medication. After you fill your prescription, take 1 week's worth of pills and put them away in a safe place. You should keep a separate, properly labeled bottle for this purpose. The remainder should be kept in the original bottle. Use this as your primary supply, until it runs out. Once it's gone, then you know that you have 1 week's worth of medicine, and it is time to come in for a prescription refill. If you do this correctly, it is unlikely that you will ever run out of medicine. To make sure that the above recommendation works, it is very important that you make sure your medication refill appointments are scheduled at least 1 week before you run out of medicine. To do this in an effective manner, make sure that you do not leave the office without scheduling your next medication management appointment. Always ask the nursing staff to show you in your prescription , when your medication will be running out. Then arrange for the receptionist to get you a return appointment, at least 7 days before you run out of medicine. Do not wait until you have 1 or 2 pills left, to come in. This is very poor planning and does not take into consideration that we may need to  cancel appointments due to bad weather, sickness, or emergencies affecting our staff. DO NOT ACCEPT A "Partial Fill": If for any reason your pharmacy does not have enough pills/tablets to completely fill or refill your prescription, do not allow for a "partial fill". The law allows the pharmacy to complete that prescription within 72 hours, without requiring a new prescription. If they do not fill the rest of your prescription within those 72 hours, you will need a separate prescription to fill the remaining amount, which we will NOT provide. If the reason for the partial fill is your insurance, you will need to talk to the pharmacist about payment alternatives for the remaining tablets, but again, DO NOT ACCEPT A PARTIAL FILL, unless you can trust your pharmacist to obtain the remainder of the pills within 72 hours.  Prescription refills and/or changes in medication(s):  Prescription refills, and/or changes in dose or medication, will be conducted only during scheduled medication management appointments. (Applies to both, written and electronic prescriptions.) No refills on procedure days. No medication will be changed or started on procedure days. No changes, adjustments, and/or refills will be conducted on a procedure day. Doing so will interfere with the diagnostic portion of the procedure. No phone refills. No medications will be "called into the pharmacy". No Fax refills. No weekend refills. No Holliday refills. No after hours refills.  Remember:  Business hours are:  Monday to Thursday 8:00 AM to 4:00 PM Provider's Schedule: Ajooni Karam, MD - Appointments are:  Medication management: Monday and Wednesday 8:00 AM to 4:00 PM Procedure day: Tuesday and Thursday 7:30 AM to 4:00 PM Bilal Lateef, MD - Appointments are:  Medication management: Tuesday and Thursday 8:00 AM to 4:00 PM Procedure day: Monday and Wednesday 7:30 AM to 4:00 PM (Last update:  04/08/2020) ____________________________________________________________________________________________  ____________________________________________________________________________________________  CBD (cannabidiol) WARNING    Applicable to: All individuals currently taking or considering taking CBD (cannabidiol) and, more important, all patients taking opioid analgesic controlled substances (pain medication). (Example: oxycodone; oxymorphone; hydrocodone; hydromorphone; morphine; methadone; tramadol; tapentadol; fentanyl; buprenorphine; butorphanol; dextromethorphan; meperidine; codeine; etc.)  Legal status: CBD remains a Schedule I drug prohibited for any use. CBD is illegal with one exception. In the [place], CBD has a limited Food and Drug Administration (FDA) approval for the treatment of two specific types of epilepsy disorders. Only one CBD product has been approved by the FDA for this purpose: "Epidiolex". FDA is aware that some companies are marketing products containing cannabis and cannabis-derived compounds in ways that violate the Federal Food, Drug and Cosmetic Act (FD&C Act) and that may put the health and safety of consumers at risk. The FDA, a Federal agency, has not enforced the CBD status since 2018.   Legality: Some manufacturers ship CBD products nationally, which is illegal. Often such products are sold online and are therefore available throughout the country. CBD is openly sold in head shops and health food stores in some states where such sales have not been explicitly legalized. Selling unapproved products with unsubstantiated therapeutic claims is not only a violation of the law, but also can put patients at risk, as these products have not been proven to be safe or effective. Federal illegality makes it difficult to conduct research on CBD.  Reference: "FDA Regulation of Cannabis and Cannabis-Derived Products, Including Cannabidiol (CBD)" -  https://www.fda.gov/news-events/public-health-focus/fda-regulation-cannabis-and-cannabis-derived-products-including-cannabidiol-cbd  Warning: CBD is not FDA approved and has not undergo the same manufacturing controls as prescription drugs.  This means that the purity and safety of available CBD may be questionable. Most of the time, despite manufacturer's claims, it is contaminated with THC (delta-9-tetrahydrocannabinol - the chemical in marijuana responsible for the "HIGH").  When this is the case, the THC contaminant will trigger a positive urine drug screen (UDS) test for Marijuana (carboxy-THC). Because a positive UDS for any illicit substance is a violation of our medication agreement, your opioid analgesics (pain medicine) may be permanently discontinued.  MORE ABOUT CBD  General Information: CBD  is a derivative of the Marijuana (cannabis sativa) plant discovered in 1940. It is one of the 113 identified substances found in Marijuana. It accounts for up to 40% of the plant's extract. As of 2018, preliminary clinical studies on CBD included research for the treatment of anxiety, movement disorders, and pain. CBD is available and consumed in multiple forms, including inhalation of smoke or vapor, as an aerosol spray, and by mouth. It may be supplied as an oil containing CBD, capsules, dried cannabis, or as a liquid solution. CBD is thought not to be as psychoactive as THC (delta-9-tetrahydrocannabinol - the chemical in marijuana responsible for the "HIGH"). Studies suggest that CBD may interact with different biological target receptors in the body, including cannabinoid and other neurotransmitter receptors. As of 2018 the mechanism of action for its biological effects has not been determined.  Side-effects  Adverse reactions: Dry mouth, diarrhea, decreased appetite, fatigue, drowsiness, malaise, weakness, sleep disturbances, and others.  Drug interactions: CBC may interact with other medications  such as blood-thinners. (Last update: 04/25/2020) ____________________________________________________________________________________________  ____________________________________________________________________________________________  Drug Holidays (Slow)  What is a "Drug Holiday"? Drug Holiday: is the name given to the period of time during which a patient stops taking a medication(s) for the purpose of eliminating tolerance to the drug.  Benefits Improved effectiveness of opioids. Decreased opioid dose needed to achieve benefits. Improved pain with lesser dose.    What is tolerance? Tolerance: is the progressive decreased in effectiveness of a drug due to its repetitive use. With repetitive use, the body gets use to the medication and as a consequence, it loses its effectiveness. This is a common problem seen with opioid pain medications. As a result, a larger dose of the drug is needed to achieve the same effect that used to be obtained with a smaller dose.  How long should a "Drug Holiday" last? You should stay off of the pain medicine for at least 14 consecutive days. (2 weeks)  Should I stop the medicine "cold turkey"? No. You should always coordinate with your Pain Specialist so that he/she can provide you with the correct medication dose to make the transition as smoothly as possible.  How do I stop the medicine? Slowly. You will be instructed to decrease the daily amount of pills that you take by one (1) pill every seven (7) days. This is called a "slow downward taper" of your dose. For example: if you normally take four (4) pills per day, you will be asked to drop this dose to three (3) pills per day for seven (7) days, then to two (2) pills per day for seven (7) days, then to one (1) per day for seven (7) days, and at the end of those last seven (7) days, this is when the "Drug Holiday" would start.   Will I have withdrawals? By doing a "slow downward taper" like this one, it  is unlikely that you will experience any significant withdrawal symptoms. Typically, what triggers withdrawals is the sudden stop of a high dose opioid therapy. Withdrawals can usually be avoided by slowly decreasing the dose over a prolonged period of time. If you do not follow these instructions and decide to stop your medication abruptly, withdrawals may be possible.  What are withdrawals? Withdrawals: refers to the wide range of symptoms that occur after stopping or dramatically reducing opiate drugs after heavy and prolonged use. Withdrawal symptoms do not occur to patients that use low dose opioids, or those who take the medication sporadically. Contrary to benzodiazepine (example: Valium, Xanax, etc.) or alcohol withdrawals ("Delirium Tremens"), opioid withdrawals are not lethal. Withdrawals are the physical manifestation of the body getting rid of the excess receptors.  Expected Symptoms Early symptoms of withdrawal may include: Agitation Anxiety Muscle aches Increased tearing Insomnia Runny nose Sweating Yawning  Late symptoms of withdrawal may include: Abdominal cramping Diarrhea Dilated pupils Goose bumps Nausea Vomiting  Will I experience withdrawals? Due to the slow nature of the taper, it is very unlikely that you will experience any.  What is a slow taper? Taper: refers to the gradual decrease in dose.  (Last update: 04/08/2020) ____________________________________________________________________________________________    

## 2021-07-25 ENCOUNTER — Other Ambulatory Visit: Payer: Self-pay | Admitting: Internal Medicine

## 2021-07-25 DIAGNOSIS — F064 Anxiety disorder due to known physiological condition: Secondary | ICD-10-CM

## 2021-07-25 NOTE — Telephone Encounter (Signed)
Requested medication (s) are due for refill today: yes  Requested medication (s) are on the active medication list: yes  Last refill:  04/29/21 #60 1 RF  Future visit scheduled: yes  Notes to clinic:  med not delegated to NT to RF   Requested Prescriptions  Pending Prescriptions Disp Refills   ALPRAZolam (XANAX) 0.5 MG tablet [Pharmacy Med Name: ALPRAZOLAM 0.5 MG TABLET] 60 tablet 1    Sig: TAKE 1 TABLET (0.5 MG TOTAL) BY MOUTH 2 (TWO) TIMES DAILY AS NEEDED FOR UP TO 60 DOSES FOR ANXIETY.     Not Delegated - Psychiatry:  Anxiolytics/Hypnotics Failed - 07/25/2021  9:49 AM      Failed - This refill cannot be delegated      Failed - Urine Drug Screen completed in last 360 days      Failed - Valid encounter within last 6 months    Recent Outpatient Visits           6 months ago Annual physical exam   [facility] Reubin Milan, MD   1 year ago Annual physical exam   [facility] Reubin Milan, MD   2 years ago Hypothyroidism due to acquired atrophy of thyroid   [facility] Reubin Milan, MD   2 years ago Streptococcal pharyngitis   [facility] Reubin Milan, MD   2 years ago Hypothyroidism due to acquired atrophy of thyroid   [facility] Reubin Milan, MD       Future Appointments             In 5 months Judithann Graves Nyoka Cowden, MD [facility], [facility]

## 2021-08-31 NOTE — Progress Notes (Signed)
PROVIDER NOTE: Information contained herein reflects review and annotations entered in association with encounter. Interpretation of such information and data should be left to medically-trained personnel. Information provided to patient can be located elsewhere in the medical record under "Patient Instructions". Document created using STT-dictation technology, any transcriptional errors that may result from process are unintentional.    Patient: Wendy Snyder  Service Category: E/M  Provider: Gaspar Cola, MD  DOB: 09-05-1966  DOS: 09/01/2021  Specialty: Interventional Pain Management  MRN: [id number]  Setting: Ambulatory outpatient  PCP: Glean Hess, MD  Type: Established Patient    Referring Provider: Glean Hess, MD  Location: Office  Delivery: Face-to-face     HPI  Ms. Carizma ANNER BAITY, a 55 y.o. year old female, is here today because of her Chronic bilateral low back pain without sciatica [M54.50, G89.29]. Ms. Lehrmann primary complain today is Back Pain (lower) Last encounter: My last encounter with her was on 06/02/2021. Pertinent problems: Ms. Musquiz has Chronic pain syndrome; Presence of functional implant (Rechargable Med[employer]onic Neurostimulator) (Cervical Epidural Leads); Spinal cord stimulator in situ (cervical leads); Chronic upper extremity pain (Right); RSD (reflex sympathetic dystrophy) (right upper extremity); Chronic low back pain (1ry area of Pain) (Bilateral) (R>L) w/o sciatica; Chronic hip pain (2ry area of Pain) (Right); Chronic wrist pain (3ry area of Pain) (Right) (CRPS); Chronic shoulder pain (Right); and CRPS (complex regional pain syndrome), type I, upper (Right) on their pertinent problem list. Pain Assessment: Severity of Chronic pain is reported as a 2 /10. Location: Back Right, Left, Lower/Denies. Onset: More than a month ago. Quality: Aching, Stabbing. Timing: Intermittent. Modifying factor(s): meds and standing. Vitals:  height is 5' 7"   (1.702 m) and weight is 140 lb (63.5 kg). Her temporal temperature is 96.9 F (36.1 C) (abnormal). Her blood pressure is 130/82 and her pulse is 82. Her respiration is 16 and oxygen saturation is 99%.   Reason for encounter: medication management.   The patient indicates doing well with the current medication regimen. No adverse reactions or side effects reported to the medications.   RTCB: 12/19/2021  Pharmacotherapy Assessment  Analgesic: Hydrocodone/APAP 10/325, 1 tab PO q 4 hrs (60 mg/day of hydrocodone) MME/day: 60 mg/day.   Monitoring: [place] PMP: PDMP reviewed during this encounter.       Pharmacotherapy: No side-effects or adverse reactions reported. Compliance: No problems identified. Effectiveness: Clinically acceptable.  Chauncey Fischer, RN  09/01/2021  8:45 AM  Sign when Signing Visit Nursing Pain Medication Assessment:  Safety precautions to be maintained throughout the outpatient stay will include: orient to surroundings, keep bed in low position, maintain call bell within reach at all times, provide assistance with transfer out of bed and ambulation.  Medication Inspection Compliance: Pill count conducted under aseptic conditions, in front of the patient. Neither the pills nor the bottle was removed from the patient's sight at any time. Once count was completed pills were immediately returned to the patient in their original bottle.  Medication: Hydrocodone/APAP Pill/Patch Count:  100 of 180 pills remain Pill/Patch Appearance: Markings consistent with prescribed medication Bottle Appearance: Standard pharmacy container. Clearly labeled. Filled Date: 59 / 3 / 2022 Last Medication intake:  Today Safety precautions to be maintained throughout the outpatient stay will include: orient to surroundings, keep bed in low position, maintain call bell within reach at all times, provide assistance with transfer out of bed and ambulation.      UDS:  Summary  Date Value Ref Range Status  12/07/2020 Note  Final    Comment:    ==================================================================== [employer] 13 (MW) ==================================================================== Test                             Result       Flag       Units  Drug Present and Declared for Prescription Verification   Hydrocodone                    1314         EXPECTED   ng/mg creat   Hydromorphone                  382          EXPECTED   ng/mg creat   Dihydrocodeine                 217          EXPECTED   ng/mg creat   Norhydrocodone                 >5000        EXPECTED   ng/mg creat    Sources of hydrocodone include scheduled prescription medications.    Hydromorphone, dihydrocodeine and norhydrocodone are expected    metabolites of hydrocodone. Hydromorphone and dihydrocodeine are    also available as scheduled prescription medications.  Drug Absent but Declared for Prescription Verification   Alprazolam                     Not Detected UNEXPECTED ng/mg creat ==================================================================== Test                      Result    Flag   Units      Ref Range   Creatinine              100              mg/dL      >=20 ==================================================================== Declared Medications:  The flagging and interpretation on this report are based on the  following declared medications.  Unexpected results may arise from  inaccuracies in the declared medications.   **Note: The testing scope of this panel includes these medications:   Alprazolam (Xanax)  Hydrocodone (Norco)   **Note: The testing scope of this panel does not include the  following reported medications:   Acetaminophen (Norco)  Acyclovir (Zovirax)  Levothyroxine (Synthroid)  Multivitamin  Naloxone (Narcan)  Naproxen (Aleve)  Valacyclovir (Valtrex)  Venlafaxine (Effexor)  Vitamin  D3 ==================================================================== For clinical consultation, please call [phone]. ====================================================================      ROS  Constitutional: Denies any fever or chills Gastrointestinal: No reported hemesis, hematochezia, vomiting, or acute GI distress Musculoskeletal: Denies any acute onset joint swelling, redness, loss of ROM, or weakness Neurological: No reported episodes of acute onset apraxia, aphasia, dysarthria, agnosia, amnesia, paralysis, loss of coordination, or loss of consciousness  Medication Review  ALPRAZolam, HYDROcodone-acetaminophen, Vitamin D3, acyclovir ointment, levothyroxine, multivitamin, naloxone, naproxen sodium, valACYclovir, and venlafaxine XR  History Review  Allergy: Ms. Luis is allergic to phenergan [promethazine hcl]. Drug: Ms. Axelson  reports no history of drug use. Alcohol:  reports no history of alcohol use. Tobacco:  reports that she has never smoked. She has never used smokeless tobacco. Social: Ms. Outen  reports that she has never smoked. She has never used smokeless tobacco. She reports that she does  not drink alcohol and does not use drugs. Medical:  has a past medical history of Depression, Hypothyroidism, and Thyroid disease. Surgical: Ms. Curvin  has a past surgical history that includes Spinal cord stimulator implant (2003); Tubal ligation (1990); Fracture surgery (Right); and Breast cyst excision (Left). Family: family history includes Breast cancer in her maternal grandmother; Breast cancer (age of onset: 49) in her mother; Depression in her mother. She was adopted.  Laboratory Chemistry Profile   Renal Lab Results  Component Value Date   BUN 13 01/05/2021   CREATININE 0.85 01/05/2021   BCR 15 01/05/2021   GFRAA 88 12/10/2019   GFRNONAA 76 12/10/2019    Hepatic Lab Results  Component Value Date   AST 21 01/05/2021   ALT 17 01/05/2021    ALBUMIN 4.7 01/05/2021   ALKPHOS 55 01/05/2021    Electrolytes Lab Results  Component Value Date   NA 143 01/05/2021   K 4.0 01/05/2021   CL 103 01/05/2021   CALCIUM 9.6 01/05/2021   MG 2.1 12/10/2019    Bone Lab Results  Component Value Date   25OHVITD1 84 12/10/2019   25OHVITD2 <1.0 12/10/2019   25OHVITD3 84 12/10/2019    Inflammation (CRP: Acute Phase) (ESR: Chronic Phase) Lab Results  Component Value Date   CRP <1 12/10/2019   ESRSEDRATE 2 12/10/2019         Note: Above Lab results reviewed.  Recent Imaging Review  MM 3D SCREEN BREAST BILATERAL CLINICAL DATA:  Screening.  EXAM: DIGITAL SCREENING BILATERAL MAMMOGRAM WITH TOMOSYNTHESIS AND CAD  TECHNIQUE: Bilateral screening digital craniocaudal and mediolateral oblique mammograms were obtained. Bilateral screening digital breast tomosynthesis was performed. The images were evaluated with computer-aided detection.  COMPARISON:  Previous exam(s).  ACR Breast Density Category c: The breast tissue is heterogeneously dense, which may obscure small masses.  FINDINGS: There are no findings suspicious for malignancy. The images were evaluated with computer-aided detection.  IMPRESSION: No mammographic evidence of malignancy. A result letter of this screening mammogram will be mailed directly to the patient.  RECOMMENDATION: Screening mammogram in one year. (Code:SM-B-01Y)  BI-RADS CATEGORY  1: Negative.  Electronically Signed   By: Nolon Nations M.D.   On: 01/22/2021 12:46 Note: Reviewed        Physical Exam  General appearance: Well nourished, well developed, and well hydrated. In no apparent acute distress Mental status: Alert, oriented x 3 (person, place, & time)       Respiratory: No evidence of acute respiratory distress Eyes: PERLA Vitals: BP 130/82 (BP Location: Right Arm, Patient Position: Sitting, Cuff Size: Normal)    Pulse 82    Temp (!) 96.9 F (36.1 C) (Temporal)    Resp 16    Ht 5' 7"   (1.702 m)    Wt 140 lb (63.5 kg)    LMP 10/13/2016 (Within Days)    SpO2 99%    BMI 21.93 kg/m  BMI: Estimated body mass index is 21.93 kg/m as calculated from the following:   Height as of this encounter: 5' 7"  (1.702 m).   Weight as of this encounter: 140 lb (63.5 kg). Ideal: Ideal body weight: 61.6 kg (135 lb 12.9 oz) Adjusted ideal body weight: 62.4 kg (137 lb 7.7 oz)  Assessment   Status Diagnosis  Controlled Controlled Controlled 1. Chronic low back pain (1ry area of Pain) (Bilateral) (R>L) w/o sciatica   2. Chronic hip pain (2ry area of Pain) (Right)   3. Chronic wrist pain (3ry area of Pain) (Right) (  CRPS)   4. Complex regional pain syndrome type 1 of right upper extremity   5. Chronic pain syndrome   6. Pharmacologic therapy   7. Chronic use of opiate for therapeutic purpose   8. Encounter for chronic pain management   9. Encounter for medication management      Updated Problems: No problems updated.  Plan of Care  Problem-specific:  No problem-specific Assessment & Plan notes found for this encounter.  Ms. EUPHEMIA LINGERFELT has a current medication list which includes the following long-term medication(s): levothyroxine, naloxone, venlafaxine xr, [START ON 09/20/2021] hydrocodone-acetaminophen, [START ON 10/20/2021] hydrocodone-acetaminophen, and [START ON 11/19/2021] hydrocodone-acetaminophen.  Pharmacotherapy (Medications Ordered): Meds ordered this encounter  Medications   HYDROcodone-acetaminophen (NORCO) 10-325 MG tablet    Sig: Take 1 tablet by mouth every 4 (four) hours as needed for severe pain. Must last 30 days    Dispense:  180 tablet    Refill:  0    DO NOT: delete (not duplicate); no partial-fill (will deny script to complete), no refill request (F/U required). DISPENSE: 1 day early if closed on fill date. WARN: No CNS-depressants within 8 hrs of med.   HYDROcodone-acetaminophen (NORCO) 10-325 MG tablet    Sig: Take 1 tablet by mouth every 4 (four) hours  as needed for severe pain. Must last 30 days    Dispense:  180 tablet    Refill:  0    DO NOT: delete (not duplicate); no partial-fill (will deny script to complete), no refill request (F/U required). DISPENSE: 1 day early if closed on fill date. WARN: No CNS-depressants within 8 hrs of med.   HYDROcodone-acetaminophen (NORCO) 10-325 MG tablet    Sig: Take 1 tablet by mouth every 4 (four) hours as needed for severe pain. Must last 30 days    Dispense:  180 tablet    Refill:  0    DO NOT: delete (not duplicate); no partial-fill (will deny script to complete), no refill request (F/U required). DISPENSE: 1 day early if closed on fill date. WARN: No CNS-depressants within 8 hrs of med.   Orders:  No orders of the defined types were placed in this encounter.  Follow-up plan:   Return in about 4 months (around 12/19/2021) for Eval-day (M,W), (F2F), (MM).     Interventional Therapies  Risk   Complexity Considerations:   Estimated body mass index is 21.93 kg/m as calculated from the following:   Height as of this encounter: 5' 7"  (1.702 m).   Weight as of this encounter: 140 lb (63.5 kg). WNL   Planned   Pending:      Under consideration:   Diagnostic bilateral lumbar facet block  Diagnostic IA right hip injection    Completed:   None since before 2020.   Therapeutic   Palliative (PRN) options:   Palliative right stellate ganglion block  Management of Cervical spinal cord stimulator     Recent Visits No visits were found meeting these conditions. Showing recent visits within past 90 days and meeting all other requirements Today's Visits Date Type Provider Dept  09/01/21 Office Visit Milinda Pointer, MD [facility]  Showing today's visits and meeting all other requirements Future Appointments No visits were found meeting these conditions. Showing future appointments within next 90 days and meeting all other requirements I discussed the assessment and treatment plan  with the patient. The patient was provided an opportunity to ask questions and all were answered. The patient agreed with the plan and demonstrated  an understanding of the instructions.  Patient advised to call back or seek an in-person evaluation if the symptoms or condition worsens.  Duration of encounter: 42 minutes.  Note by: Gaspar Cola, MD Date: 09/01/2021; Time: 9:11 AM

## 2021-09-01 ENCOUNTER — Ambulatory Visit: Payer: Commercial Managed Care - PPO | Attending: Pain Medicine | Admitting: Pain Medicine

## 2021-09-01 ENCOUNTER — Other Ambulatory Visit: Payer: Self-pay

## 2021-09-01 ENCOUNTER — Encounter: Payer: Self-pay | Admitting: Pain Medicine

## 2021-09-01 VITALS — BP 130/82 | HR 82 | Temp 96.9°F | Resp 16 | Ht 67.0 in | Wt 140.0 lb

## 2021-09-01 DIAGNOSIS — M25551 Pain in right hip: Secondary | ICD-10-CM | POA: Insufficient documentation

## 2021-09-01 DIAGNOSIS — G8929 Other chronic pain: Secondary | ICD-10-CM | POA: Diagnosis present

## 2021-09-01 DIAGNOSIS — M25531 Pain in right wrist: Secondary | ICD-10-CM | POA: Insufficient documentation

## 2021-09-01 DIAGNOSIS — Z79899 Other long term (current) drug therapy: Secondary | ICD-10-CM | POA: Diagnosis present

## 2021-09-01 DIAGNOSIS — G894 Chronic pain syndrome: Secondary | ICD-10-CM | POA: Insufficient documentation

## 2021-09-01 DIAGNOSIS — Z79891 Long term (current) use of opiate analgesic: Secondary | ICD-10-CM | POA: Insufficient documentation

## 2021-09-01 DIAGNOSIS — G90511 Complex regional pain syndrome I of right upper limb: Secondary | ICD-10-CM

## 2021-09-01 DIAGNOSIS — M545 Low back pain, unspecified: Secondary | ICD-10-CM | POA: Diagnosis present

## 2021-09-01 MED ORDER — HYDROCODONE-ACETAMINOPHEN 10-325 MG PO TABS: 1.0000 | ORAL_TABLET | ORAL | 0 refills | Status: DC | PRN

## 2021-09-01 NOTE — Progress Notes (Signed)
Nursing Pain Medication Assessment:  Safety precautions to be maintained throughout the outpatient stay will include: orient to surroundings, keep bed in low position, maintain call bell within reach at all times, provide assistance with transfer out of bed and ambulation.  Medication Inspection Compliance: Pill count conducted under aseptic conditions, in front of the patient. Neither the pills nor the bottle was removed from the patient's sight at any time. Once count was completed pills were immediately returned to the patient in their original bottle.  Medication: Hydrocodone/APAP Pill/Patch Count:  100 of 180 pills remain Pill/Patch Appearance: Markings consistent with prescribed medication Bottle Appearance: Standard pharmacy container. Clearly labeled. Filled Date: 75 / 3 / 2022 Last Medication intake:  Today Safety precautions to be maintained throughout the outpatient stay will include: orient to surroundings, keep bed in low position, maintain call bell within reach at all times, provide assistance with transfer out of bed and ambulation.

## 2021-09-13 ENCOUNTER — Other Ambulatory Visit: Payer: Self-pay | Admitting: Internal Medicine

## 2021-09-13 DIAGNOSIS — E034 Atrophy of thyroid (acquired): Secondary | ICD-10-CM

## 2021-09-14 NOTE — Telephone Encounter (Signed)
Requested Prescriptions  Pending Prescriptions Disp Refills   levothyroxine (SYNTHROID) 50 MCG tablet [Pharmacy Med Name: LEVOTHYROXINE 50 MCG TABLET] 90 tablet 1    Sig: TAKE 1 TABLET BY MOUTH EVERY DAY     Endocrinology:  Hypothyroid Agents Failed - 09/13/2021 11:01 AM      Failed - TSH needs to be rechecked within 3 months after an abnormal result. Refill until TSH is due.      Passed - TSH in normal range and within 360 days    TSH  Date Value Ref Range Status  01/05/2021 1.790 0.450 - 4.500 uIU/mL Final         Passed - Valid encounter within last 12 months    Recent Outpatient Visits          8 months ago Annual physical exam   [facility] Reubin Milan, MD   1 year ago Annual physical exam   [facility] Reubin Milan, MD   2 years ago Hypothyroidism due to acquired atrophy of thyroid   [facility] Reubin Milan, MD   2 years ago Streptococcal pharyngitis   [facility] Reubin Milan, MD   2 years ago Hypothyroidism due to acquired atrophy of thyroid   [facility] Reubin Milan, MD      Future Appointments            In 3 months Judithann Graves Nyoka Cowden, MD [facility]

## 2021-10-03 ENCOUNTER — Other Ambulatory Visit: Payer: Self-pay | Admitting: Internal Medicine

## 2021-10-03 DIAGNOSIS — F064 Anxiety disorder due to known physiological condition: Secondary | ICD-10-CM

## 2021-10-03 NOTE — Telephone Encounter (Signed)
Requested medication (s) are due for refill today: yes  Requested medication (s) are on the active medication list: yes  Last refill:  07/26/21  Future visit scheduled: yes  Notes to clinic:  med not delegated to NT to RF   Requested Prescriptions  Pending Prescriptions Disp Refills   ALPRAZolam (XANAX) 0.5 MG tablet [Pharmacy Med Name: ALPRAZOLAM 0.5 MG TABLET] 60 tablet 1    Sig: TAKE 1 TABLET BY MOUTH 2 TIMES DAILY AS NEEDED FOR ANXIETY.     Not Delegated - Psychiatry:  Anxiolytics/Hypnotics Failed - 10/03/2021  2:09 PM      Failed - This refill cannot be delegated      Failed - Urine Drug Screen completed in last 360 days      Failed - Valid encounter within last 6 months    Recent Outpatient Visits           9 months ago Annual physical exam   [facility] Reubin Milan, MD   1 year ago Annual physical exam   [facility] Reubin Milan, MD   2 years ago Hypothyroidism due to acquired atrophy of thyroid   [facility] Reubin Milan, MD   2 years ago Streptococcal pharyngitis   [facility] Reubin Milan, MD   2 years ago Hypothyroidism due to acquired atrophy of thyroid   [facility] Reubin Milan, MD       Future Appointments             In 3 months Judithann Graves Nyoka Cowden, MD [facility]

## 2021-10-04 NOTE — Telephone Encounter (Signed)
Please review. Last office visit 01/05/2021.  KP

## 2021-12-20 NOTE — Progress Notes (Signed)
PROVIDER NOTE: Information contained herein reflects review and annotations entered in association with encounter. Interpretation of such information and data should be left to medically-trained personnel. Information provided to patient can be located elsewhere in the medical record under "Patient Instructions". Document created using STT-dictation technology, any transcriptional errors that may result from process are unintentional.  ?  ?Patient: Wendy Snyder  Service Category: E/M  Provider: Gaspar Cola, MD  ?DOB: [birth year]  DOS: 12/22/2021  Specialty: Interventional Pain Management  ?MRN: [id number]  Setting: Ambulatory outpatient  PCP: Wendy Hess, MD  ?Type: Established Patient    Referring Provider: Glean Hess, MD  ?Location: Office  Delivery: Face-to-face    ? ?HPI  ?[name], a 56 y.o. year old female, is here today because of her Chronic pain syndrome [G89.4]. [name] primary complain today is Back Pain (lower) ?Last encounter: My last encounter with her was on 09/01/2021. ?Pertinent problems: [name] has Chronic pain syndrome; Presence of functional implant (Rechargable Medtronic Neurostimulator) (Cervical Epidural Leads); Spinal cord stimulator in situ (cervical leads); Chronic upper extremity pain (Right); RSD (reflex sympathetic dystrophy) (right upper extremity); Chronic low back pain (1ry area of Pain) (Bilateral) (R>L) w/o sciatica; Chronic hip pain (2ry area of Pain) (Right); Chronic wrist pain (3ry area of Pain) (Right) (CRPS); Chronic shoulder pain (Right); and CRPS (complex regional pain syndrome), type I, upper (Right) on their pertinent problem list. ?Pain Assessment: Severity of Chronic pain is reported as a 2 /10. Location: Back Right, Left/Denies. Onset: More than a month ago. Quality: Aching, Sharp, Discomfort, Radiating. Timing: Constant. Modifying factor(s): moving around, meds. ?Vitals:  height is 5' 7"  (1.702 m) and weight is 140 lb  (63.5 kg). Her temperature is 97 ?F (36.1 ?C) (abnormal). Her blood pressure is 120/80 and her pulse is 72. Her oxygen saturation is 99%.  ? ?Reason for encounter: medication management.   The patient indicates doing well with the current medication regimen. No adverse reactions or side effects reported to the medications.  ? ?RTCB: 03/22/2022 ? ?Pharmacotherapy Assessment  ?Analgesic: Hydrocodone/APAP 10/325, 1 tab PO q 4 hrs (60 mg/day of hydrocodone) ?MME/day: 60 mg/day.  ? ?Monitoring: ?[place] PMP: PDMP reviewed during this encounter.       ?Pharmacotherapy: No side-effects or adverse reactions reported. ?Compliance: No problems identified. ?Effectiveness: Clinically acceptable. ? ?Wendy Fischer, RN  12/22/2021  8:11 AM  Sign when Signing Visit ?Nursing Pain Medication Assessment:  ?Safety precautions to be maintained throughout the outpatient stay will include: orient to surroundings, keep bed in low position, maintain call bell within reach at all times, provide assistance with transfer out of bed and ambulation.  ?Medication Inspection Compliance: [name] did not comply with our request to bring her pills to be counted. She was reminded that bringing the medication bottles, even when empty, is a requirement. ? ?Medication: None brought in. ?Pill/Patch Count: None available to be counted. ?Bottle Appearance: No container available. Did not bring bottle(s) to appointment. ?Filled Date: N/A ?Last Medication intake:  TodaySafety precautions to be maintained throughout the outpatient stay will include: orient to surroundings, keep bed in low position, maintain call bell within reach at all times, provide assistance with transfer out of bed and ambulation.  ?   UDS:  ?Summary  ?Date Value Ref Range Status  ?12/07/2020 Note  Final  ?  Comment:  ?  ==================================================================== ?[employer] Select 13 (MW) ?==================================================================== ?Test  Result       Flag       Units ? ?Drug Present and Declared for Prescription Verification ?  Hydrocodone                    1314         EXPECTED   ng/mg creat ?  Hydromorphone                  382          EXPECTED   ng/mg creat ?  Dihydrocodeine                 217          EXPECTED   ng/mg creat ?  Norhydrocodone                 >5000        EXPECTED   ng/mg creat ?   Sources of hydrocodone include scheduled prescription medications. ?   Hydromorphone, dihydrocodeine and norhydrocodone are expected ?   metabolites of hydrocodone. Hydromorphone and dihydrocodeine are ?   also available as scheduled prescription medications. ? ?Drug Absent but Declared for Prescription Verification ?  Alprazolam                     Not Detected UNEXPECTED ng/mg creat ?==================================================================== ?Test                      Result    Flag   Units      Ref Range ?  Creatinine              100              mg/dL      >=20 ?==================================================================== ?Declared Medications: ? The flagging and interpretation on this report are based on the ? following declared medications.  Unexpected results may arise from ? inaccuracies in the declared medications. ? ? **Note: The testing scope of this panel includes these medications: ? ? Alprazolam (Xanax) ? Hydrocodone (Norco) ? ? **Note: The testing scope of this panel does not include the ? following reported medications: ? ? Acetaminophen (Norco) ? Acyclovir (Zovirax) ? Levothyroxine (Synthroid) ? Multivitamin ? Naloxone (Narcan) ? Naproxen (Aleve) ? Valacyclovir (Valtrex) ? Venlafaxine (Effexor) ? Vitamin D3 ?==================================================================== ?For clinical consultation, please call [phone]. ?==================================================================== ?  ?  ? ?ROS  ?Constitutional: Denies any fever or chills ?Gastrointestinal: No reported  hemesis, hematochezia, vomiting, or acute GI distress ?Musculoskeletal: Denies any acute onset joint swelling, redness, loss of ROM, or weakness ?Neurological: No reported episodes of acute onset apraxia, aphasia, dysarthria, agnosia, amnesia, paralysis, loss of coordination, or loss of consciousness ? ?Medication Review  ?ALPRAZolam, HYDROcodone-acetaminophen, Vitamin D3, acyclovir ointment, levothyroxine, multivitamin, naloxone, naproxen sodium, valACYclovir, and venlafaxine XR ? ?History Review  ?Allergy: [name] is allergic to phenergan [promethazine hcl]. ?Drug: [name]  reports no history of drug use. ?Alcohol:  reports no history of alcohol use. ?Tobacco:  reports that she has never smoked. She has never used smokeless tobacco. ?Social: [name]  reports that she has never smoked. She has never used smokeless tobacco. She reports that she does not drink alcohol and does not use drugs. ?Medical:  has a past medical history of Depression, Hypothyroidism, and Thyroid disease. ?Surgical: [name]  has a past surgical history that includes Spinal cord stimulator implant (2003); Tubal ligation (1990); Fracture surgery (Right); and Breast cyst  excision (Left). ?Family: family history includes Breast cancer in her maternal grandmother; Breast cancer (age of onset: 67) in her mother; Depression in her mother. She was adopted. ? ?Laboratory Chemistry Profile  ? ?Renal ?Lab Results  ?Component Value Date  ? BUN 13 01/05/2021  ? CREATININE 0.85 01/05/2021  ? BCR 15 01/05/2021  ? GFRAA 88 12/10/2019  ? GFRNONAA 76 12/10/2019  ?  Hepatic ?Lab Results  ?Component Value Date  ? AST 21 01/05/2021  ? ALT 17 01/05/2021  ? ALBUMIN 4.7 01/05/2021  ? ALKPHOS 55 01/05/2021  ?  ?Electrolytes ?Lab Results  ?Component Value Date  ? NA 143 01/05/2021  ? K 4.0 01/05/2021  ? CL 103 01/05/2021  ? CALCIUM 9.6 01/05/2021  ? MG 2.1 12/10/2019  ?  Bone ?Lab Results  ?Component Value Date  ? 25OHVITD1 84 12/10/2019  ?  42ASTMHD6 <1.0 12/10/2019  ? 25OHVITD3 84 12/10/2019  ?  ?Inflammation (CRP: Acute Phase) (ESR: Chronic Phase) ?Lab Results  ?Component Value Date  ? CRP <1 12/10/2019  ? ESRSEDRATE 2 12/10/2019  ?    ?  ? ?N

## 2021-12-21 NOTE — Patient Instructions (Signed)
____________________________________________________________________________________________ ? ?Medication Rules ? ?Purpose: To inform patients, and their family members, of our rules and regulations. ? ?Applies to: All patients receiving prescriptions (written or electronic). ? ?Pharmacy of record: Pharmacy where electronic prescriptions will be sent. If written prescriptions are taken to a different pharmacy, please inform the nursing staff. The pharmacy listed in the electronic medical record should be the one where you would like electronic prescriptions to be sent. ? ?Electronic prescriptions: In compliance with the [place] Strengthen Opioid Misuse Prevention (STOP) Act of 2017 (Session Law 2017-74/H243), effective September 19, 2018, all controlled substances must be electronically prescribed. Calling prescriptions to the pharmacy will cease to exist. ? ?Prescription refills: Only during scheduled appointments. Applies to all prescriptions. ? ?NOTE: The following applies primarily to controlled substances (Opioid* Pain Medications).  ? ?Type of encounter (visit): For patients receiving controlled substances, face-to-face visits are required. (Not an option or up to the patient.) ? ?Patient's responsibilities: ?Pain Pills: Bring all pain pills to every appointment (except for procedure appointments). ?Pill Bottles: Bring pills in original pharmacy bottle. Always bring the newest bottle. Bring bottle, even if empty. ?Medication refills: You are responsible for knowing and keeping track of what medications you take and those you need refilled. ?The day before your appointment: write a list of all prescriptions that need to be refilled. ?The day of the appointment: give the list to the admitting nurse. Prescriptions will be written only during appointments. No prescriptions will be written on procedure days. ?If you forget a medication: it will not be "Called in", "Faxed", or "electronically sent". You will  need to get another appointment to get these prescribed. ?No early refills. Do not call asking to have your prescription filled early. ?Prescription Accuracy: You are responsible for carefully inspecting your prescriptions before leaving our office. Have the discharge nurse carefully go over each prescription with you, before taking them home. Make sure that your name is accurately spelled, that your address is correct. Check the name and dose of your medication to make sure it is accurate. Check the number of pills, and the written instructions to make sure they are clear and accurate. Make sure that you are given enough medication to last until your next medication refill appointment. ?Taking Medication: Take medication as prescribed. When it comes to controlled substances, taking less pills or less frequently than prescribed is permitted and encouraged. ?Never take more pills than instructed. ?Never take medication more frequently than prescribed.  ?Inform other Doctors: Always inform, all of your healthcare providers, of all the medications you take. ?Pain Medication from other Providers: You are not allowed to accept any additional pain medication from any other Doctor or Healthcare provider. There are two exceptions to this rule. (see below) In the event that you require additional pain medication, you are responsible for notifying us, as stated below. ?Cough Medicine: Often these contain an opioid, such as codeine or hydrocodone. Never accept or take cough medicine containing these opioids if you are already taking an opioid* medication. The combination may cause respiratory failure and death. ?Medication Agreement: You are responsible for carefully reading and following our Medication Agreement. This must be signed before receiving any prescriptions from our practice. Safely store a copy of your signed Agreement. Violations to the Agreement will result in no further prescriptions. (Additional copies of our  Medication Agreement are available upon request.) ?Laws, Rules, & Regulations: All patients are expected to follow all Federal and State Laws, Statutes, Rules, & Regulations. Ignorance of   the Laws does not constitute a valid excuse.  ?Illegal drugs and Controlled Substances: The use of illegal substances (including, but not limited to marijuana and its derivatives) and/or the illegal use of any controlled substances is strictly prohibited. Violation of this rule may result in the immediate and permanent discontinuation of any and all prescriptions being written by our practice. The use of any illegal substances is prohibited. ?Adopted CDC guidelines & recommendations: Target dosing levels will be at or below 60 MME/day. Use of benzodiazepines** is not recommended. ? ?Exceptions: There are only two exceptions to the rule of not receiving pain medications from other Healthcare Providers. ?Exception #1 (Emergencies): In the event of an emergency (i.e.: accident requiring emergency care), you are allowed to receive additional pain medication. However, you are responsible for: As soon as you are able, call our office [phone], at any time of the day or night, and leave a message stating your name, the date and nature of the emergency, and the name and dose of the medication prescribed. In the event that your call is answered by a member of our staff, make sure to document and save the date, time, and the name of the person that took your information.  ?Exception #2 (Planned Surgery): In the event that you are scheduled by another doctor or dentist to have any type of surgery or procedure, you are allowed (for a period no longer than 30 days), to receive additional pain medication, for the acute post-op pain. However, in this case, you are responsible for picking up a copy of our "Post-op Pain Management for Surgeons" handout, and giving it to your surgeon or dentist. This document is available at our office, and  does not require an appointment to obtain it. Simply go to our office during business hours (Monday-Thursday from 8:00 AM to 4:00 PM) (Friday 8:00 AM to 12:00 Noon) or if you have a scheduled appointment with us, prior to your surgery, and ask for it by name. In addition, you are responsible for: calling our office [phone], at any time of the day or night, and leaving a message stating your name, name of your surgeon, type of surgery, and date of procedure or surgery. Failure to comply with your responsibilities may result in termination of therapy involving the controlled substances. ?Medication Agreement Violation. Following the above rules, including your responsibilities will help you in avoiding a Medication Agreement Violation (?Breaking your Pain Medication Contract?). ? ?*Opioid medications include: morphine, codeine, oxycodone, oxymorphone, hydrocodone, hydromorphone, meperidine, tramadol, tapentadol, buprenorphine, fentanyl, methadone. ?**Benzodiazepine medications include: diazepam (Valium), alprazolam (Xanax), clonazepam (Klonopine), lorazepam (Ativan), clorazepate (Tranxene), chlordiazepoxide (Librium), estazolam (Prosom), oxazepam (Serax), temazepam (Restoril), triazolam (Halcion) ?(Last updated: 06/16/2021) ?____________________________________________________________________________________________ ? ____________________________________________________________________________________________ ? ?Medication Recommendations and Reminders ? ?Applies to: All patients receiving prescriptions (written and/or electronic). ? ?Medication Rules & Regulations: These rules and regulations exist for your safety and that of others. They are not flexible and neither are we. Dismissing or ignoring them will be considered "non-compliance" with medication therapy, resulting in complete and irreversible termination of such therapy. (See document titled "Medication Rules" for more details.) In all conscience,  because of safety reasons, we cannot continue providing a therapy where the patient does not follow instructions. ? ?Pharmacy of record:  ?Definition: This is the pharmacy where your electronic prescriptions w

## 2021-12-22 ENCOUNTER — Encounter: Payer: Self-pay | Admitting: Pain Medicine

## 2021-12-22 ENCOUNTER — Ambulatory Visit: Payer: Commercial Managed Care - PPO | Attending: Pain Medicine | Admitting: Pain Medicine

## 2021-12-22 VITALS — BP 120/80 | HR 72 | Temp 97.0°F | Ht 67.0 in | Wt 140.0 lb

## 2021-12-22 DIAGNOSIS — M545 Low back pain, unspecified: Secondary | ICD-10-CM | POA: Diagnosis not present

## 2021-12-22 DIAGNOSIS — Z79899 Other long term (current) drug therapy: Secondary | ICD-10-CM | POA: Diagnosis present

## 2021-12-22 DIAGNOSIS — Z79891 Long term (current) use of opiate analgesic: Secondary | ICD-10-CM | POA: Diagnosis present

## 2021-12-22 DIAGNOSIS — M25551 Pain in right hip: Secondary | ICD-10-CM

## 2021-12-22 DIAGNOSIS — G8929 Other chronic pain: Secondary | ICD-10-CM | POA: Diagnosis present

## 2021-12-22 DIAGNOSIS — M25531 Pain in right wrist: Secondary | ICD-10-CM | POA: Diagnosis not present

## 2021-12-22 DIAGNOSIS — G90511 Complex regional pain syndrome I of right upper limb: Secondary | ICD-10-CM | POA: Diagnosis present

## 2021-12-22 DIAGNOSIS — G894 Chronic pain syndrome: Secondary | ICD-10-CM | POA: Diagnosis present

## 2021-12-22 MED ORDER — HYDROCODONE-ACETAMINOPHEN 10-325 MG PO TABS: 1.0000 | ORAL_TABLET | ORAL | 0 refills | Status: DC | PRN

## 2021-12-22 NOTE — Progress Notes (Signed)
Nursing Pain Medication Assessment:  Safety precautions to be maintained throughout the outpatient stay will include: orient to surroundings, keep bed in low position, maintain call bell within reach at all times, provide assistance with transfer out of bed and ambulation.  Medication Inspection Compliance: Wendy Snyder did not comply with our request to bring her pills to be counted. She was reminded that bringing the medication bottles, even when empty, is a requirement.  Medication: None brought in. Pill/Patch Count: None available to be counted. Bottle Appearance: No container available. Did not bring bottle(s) to appointment. Filled Date: N/A Last Medication intake:  Today Safety precautions to be maintained throughout the outpatient stay will include: orient to surroundings, keep bed in low position, maintain call bell within reach at all times, provide assistance with transfer out of bed and ambulation.  

## 2021-12-31 ENCOUNTER — Other Ambulatory Visit: Payer: Self-pay | Admitting: Internal Medicine

## 2021-12-31 DIAGNOSIS — F064 Anxiety disorder due to known physiological condition: Secondary | ICD-10-CM

## 2021-12-31 NOTE — Telephone Encounter (Signed)
Requested medication (s) are due for refill today: yes ? ?Requested medication (s) are on the active medication list: yes ? ?Last refill:  05/31/21 #90 1 refills ? ?Future visit scheduled: yes in 1 week ? ?Notes to clinic:  courtesy refill future OV in 1 week. Do you want to give enough for 1 week or #90? Last labs 01/05/21 ? ? ?  ?Requested Prescriptions  ?Pending Prescriptions Disp Refills  ? venlafaxine XR (EFFEXOR-XR) 75 MG 24 hr capsule [Pharmacy Med Name: VENLAFAXINE HCL ER 75 MG CAP] 90 capsule 1  ?  Sig: TAKE 1 CAPSULE BY MOUTH EVERY DAY  ?  ? Psychiatry: Antidepressants - SNRI - desvenlafaxine & venlafaxine Failed - 12/31/2021  2:47 AM  ?  ?  Failed - Valid encounter within last 6 months  ?  Recent Outpatient Visits   ? ?      ? 12 months ago Annual physical exam  ? [facility] Reubin Milan, MD  ? 2 years ago Annual physical exam  ? [facility] Reubin Milan, MD  ? 2 years ago Hypothyroidism due to acquired atrophy of thyroid  ? [facility] Reubin Milan, MD  ? 2 years ago Streptococcal pharyngitis  ? [facility] Reubin Milan, MD  ? 3 years ago Hypothyroidism due to acquired atrophy of thyroid  ? [facility] Reubin Milan, MD  ? ?  ?  ?Future Appointments   ? ?        ? In 1 week Reubin Milan, MD [facility]  ? ?  ? ?  ?  ?  Failed - Lipid Panel in normal range within the last 12 months  ?  Cholesterol, Total  ?Date Value Ref Range Status  ?01/05/2021 194 100 - 199 mg/dL Final  ? ?LDL Chol Calc (NIH)  ?Date Value Ref Range Status  ?01/05/2021 89 0 - 99 mg/dL Final  ? ?HDL  ?Date Value Ref Range Status  ?01/05/2021 91 >39 mg/dL Final  ? ?Triglycerides  ?Date Value Ref Range Status  ?01/05/2021 81 0 - 149 mg/dL Final  ? ?  ?  ?  Passed - Cr in normal range and within 360 days  ?  Creatinine  ?Date Value Ref Range Status  ?11/08/2013 0.93 0.60 - 1.30 mg/dL Final  ? ?Creatinine, Ser  ?Date Value Ref Range Status   ?01/05/2021 0.85 0.57 - 1.00 mg/dL Final  ?  ?  ?  ?  Passed - Completed PHQ-2 or PHQ-9 in the last 360 days  ?  ?  Passed - Last BP in normal range  ?  BP Readings from Last 1 Encounters:  ?12/22/21 120/80  ?  ?  ?  ?  ? ?

## 2022-01-10 ENCOUNTER — Ambulatory Visit (INDEPENDENT_AMBULATORY_CARE_PROVIDER_SITE_OTHER): Payer: Commercial Managed Care - PPO | Admitting: Internal Medicine

## 2022-01-10 ENCOUNTER — Encounter: Payer: Self-pay | Admitting: Internal Medicine

## 2022-01-10 ENCOUNTER — Other Ambulatory Visit (HOSPITAL_COMMUNITY)
Admission: RE | Admit: 2022-01-10 | Discharge: 2022-01-10 | Disposition: A | Discharge status: Home / Self Care | Payer: Commercial Managed Care - PPO | Source: Ambulatory Visit | Attending: Internal Medicine | Admitting: Internal Medicine

## 2022-01-10 VITALS — BP 122/68 | HR 64 | Ht 67.0 in | Wt 150.0 lb

## 2022-01-10 DIAGNOSIS — Z1231 Encounter for screening mammogram for malignant neoplasm of breast: Secondary | ICD-10-CM | POA: Diagnosis not present

## 2022-01-10 DIAGNOSIS — L03113 Cellulitis of right upper limb: Secondary | ICD-10-CM | POA: Diagnosis not present

## 2022-01-10 DIAGNOSIS — Z1211 Encounter for screening for malignant neoplasm of colon: Secondary | ICD-10-CM

## 2022-01-10 DIAGNOSIS — Z124 Encounter for screening for malignant neoplasm of cervix: Secondary | ICD-10-CM | POA: Insufficient documentation

## 2022-01-10 DIAGNOSIS — Z1159 Encounter for screening for other viral diseases: Secondary | ICD-10-CM | POA: Diagnosis not present

## 2022-01-10 DIAGNOSIS — Z0000 Encounter for general adult medical examination without abnormal findings: Secondary | ICD-10-CM

## 2022-01-10 DIAGNOSIS — F3341 Major depressive disorder, recurrent, in partial remission: Secondary | ICD-10-CM

## 2022-01-10 DIAGNOSIS — Z23 Encounter for immunization: Secondary | ICD-10-CM

## 2022-01-10 DIAGNOSIS — R21 Rash and other nonspecific skin eruption: Secondary | ICD-10-CM | POA: Diagnosis not present

## 2022-01-10 DIAGNOSIS — E034 Atrophy of thyroid (acquired): Secondary | ICD-10-CM | POA: Diagnosis not present

## 2022-01-10 MED ORDER — TRIAMCINOLONE ACETONIDE 0.5 % EX CREA: 1.0000 "application " | TOPICAL_CREAM | Freq: Three times a day (TID) | CUTANEOUS | 0 refills | Status: DC

## 2022-01-10 MED ORDER — AMOXICILLIN-POT CLAVULANATE 875-125 MG PO TABS: 1.0000 | ORAL_TABLET | Freq: Two times a day (BID) | ORAL | 0 refills | Status: AC
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
## 2022-01-11 LAB — TSH+FREE T4
Free T4: 1.4 ng/dL (ref 0.82–1.77)
TSH: 1.46 u[IU]/mL (ref 0.450–4.500)

## 2022-01-11 LAB — CBC WITH DIFFERENTIAL/PLATELET
Basophils Absolute: 0.1 10*3/uL (ref 0.0–0.2)
Basos: 2 %
EOS (ABSOLUTE): 0.1 10*3/uL (ref 0.0–0.4)
Eos: 2 %
Hematocrit: 40.3 % (ref 34.0–46.6)
Hemoglobin: 13.7 g/dL (ref 11.1–15.9)
Immature Grans (Abs): 0 10*3/uL (ref 0.0–0.1)
Immature Granulocytes: 0 %
Lymphocytes Absolute: 0.9 10*3/uL (ref 0.7–3.1)
Lymphs: 16 %
MCH: 30.4 pg (ref 26.6–33.0)
MCHC: 34 g/dL (ref 31.5–35.7)
MCV: 90 fL (ref 79–97)
Monocytes Absolute: 0.3 10*3/uL (ref 0.1–0.9)
Monocytes: 6 %
Neutrophils Absolute: 4.4 10*3/uL (ref 1.4–7.0)
Neutrophils: 74 %
Platelets: 201 10*3/uL (ref 150–450)
RBC: 4.5 x10E6/uL (ref 3.77–5.28)
RDW: 11.9 % (ref 11.7–15.4)
WBC: 5.9 10*3/uL (ref 3.4–10.8)

## 2022-01-11 LAB — COMPREHENSIVE METABOLIC PANEL
ALT: 23 IU/L (ref 0–32)
AST: 27 IU/L (ref 0–40)
Albumin/Globulin Ratio: 2.9 — ABNORMAL HIGH (ref 1.2–2.2)
Albumin: 4.9 g/dL (ref 3.8–4.9)
Alkaline Phosphatase: 61 IU/L (ref 44–121)
BUN/Creatinine Ratio: 22 (ref 9–23)
BUN: 17 mg/dL (ref 6–24)
Bilirubin Total: 0.4 mg/dL (ref 0.0–1.2)
CO2: 24 mmol/L (ref 20–29)
Calcium: 10 mg/dL (ref 8.7–10.2)
Chloride: 105 mmol/L (ref 96–106)
Creatinine, Ser: 0.79 mg/dL (ref 0.57–1.00)
Globulin, Total: 1.7 g/dL (ref 1.5–4.5)
Glucose: 84 mg/dL (ref 70–99)
Potassium: 4.4 mmol/L (ref 3.5–5.2)
Sodium: 143 mmol/L (ref 134–144)
Total Protein: 6.6 g/dL (ref 6.0–8.5)
eGFR: 88 mL/min/{1.73_m2} (ref >59)

## 2022-01-11 LAB — HEPATITIS C ANTIBODY: Hep C Virus Ab: NONREACTIVE

## 2022-01-11 LAB — HEMOGLOBIN A1C
Est. average glucose Bld gHb Est-mCnc: 103 mg/dL
Hgb A1c MFr Bld: 5.2 % (ref 4.8–5.6)

## 2022-01-11 LAB — LIPID PANEL
Chol/HDL Ratio: 2.2 ratio (ref 0.0–4.4)
Cholesterol, Total: 162 mg/dL (ref 100–199)
HDL: 75 mg/dL (ref >39)
LDL Chol Calc (NIH): 70 mg/dL (ref 0–99)
Triglycerides: 96 mg/dL (ref 0–149)
VLDL Cholesterol Cal: 17 mg/dL (ref 5–40)

## 2022-01-13 LAB — CYTOLOGY - PAP
Comment: NEGATIVE
High risk HPV: NEGATIVE

## 2022-01-27 ENCOUNTER — Other Ambulatory Visit: Payer: Self-pay | Admitting: Internal Medicine

## 2022-01-27 DIAGNOSIS — F064 Anxiety disorder due to known physiological condition: Secondary | ICD-10-CM

## 2022-01-28 NOTE — Telephone Encounter (Signed)
Please review. Last office visit 01/10/22.  KP

## 2022-01-28 NOTE — Telephone Encounter (Signed)
Requested medication (s) are due for refill today: Yes ? ?Requested medication (s) are on the active medication list: Yes ? ?Last refill:  10/04/21 ? ?Future visit scheduled: Yes ? ?Notes to clinic:  See request. ? ? ? ?Requested Prescriptions  ?Pending Prescriptions Disp Refills  ? ALPRAZolam (XANAX) 0.5 MG tablet [Pharmacy Med Name: ALPRAZOLAM 0.5 MG TABLET] 60 tablet 2  ?  Sig: TAKE 1 TABLET BY MOUTH TWICE A DAY AS NEEDED FOR ANXIETY  ?  ? Not Delegated - Psychiatry: Anxiolytics/Hypnotics 2 Failed - 01/27/2022  7:16 PM  ?  ?  Failed - This refill cannot be delegated  ?  ?  Failed - Urine Drug Screen completed in last 360 days  ?  ?  Passed - Patient is not pregnant  ?  ?  Passed - Valid encounter within last 6 months  ?  Recent Outpatient Visits   ? ?      ? 2 weeks ago Annual physical exam  ? [facility] Reubin Milan, MD  ? 1 year ago Annual physical exam  ? [facility] Reubin Milan, MD  ? 2 years ago Annual physical exam  ? [facility] Reubin Milan, MD  ? 2 years ago Hypothyroidism due to acquired atrophy of thyroid  ? [facility] Reubin Milan, MD  ? 2 years ago Streptococcal pharyngitis  ? [facility] Reubin Milan, MD  ? ?  ?  ?Future Appointments   ? ?        ? In 11 months Reubin Milan, MD [facility]  ? ?  ? ? ?  ?  ?  ? ?

## 2022-02-09 ENCOUNTER — Telehealth: Payer: Self-pay | Admitting: Internal Medicine

## 2022-02-09 NOTE — Telephone Encounter (Signed)
Tried calling the pt back. VM is full and could not leave a msg. Sent patient a my chart message regarding follow up on call.

## 2022-02-09 NOTE — Telephone Encounter (Signed)
Copied from [place] [phone]. Topic: General - Other >> Feb 09, 2022  9:53 AM Pawlus, Brayton Layman A wrote: Reason for CRM: Pt called in and wanted to speak directly with Chassidy, please advise.

## 2022-03-14 NOTE — Progress Notes (Signed)
PROVIDER NOTE: Information contained herein reflects review and annotations entered in association with encounter. Interpretation of Wendy Snyder information and data should be left to medically-trained personnel. Information provided to patient can be located elsewhere in the medical record under "Patient Instructions". Document created using STT-dictation technology, any transcriptional errors that may result from process are unintentional.    Patient: Wendy Snyder  Service Category: E/M  Provider: Gaspar Cola, MD  DOB: 1966-03-06  DOS: 03/16/2022  Specialty: Interventional Pain Management  MRN: [id number]  Setting: Ambulatory outpatient  PCP: Glean Hess, MD  Type: Established Patient    Referring Provider: Glean Hess, MD  Location: Office  Delivery: Face-to-face     HPI  [name], a 56 y.o. year old female, is here today because of her Chronic pain syndrome [G89.4]. [name] primary complain today is Back Pain (lower) Last encounter: My last encounter with her was on 12/22/2021. Pertinent problems: [name] has Chronic pain syndrome; Presence of functional implant (Rechargable [employer] Neurostimulator) (Cervical Epidural Leads); Spinal cord stimulator in situ (cervical leads); Chronic upper extremity pain (Right); RSD (reflex sympathetic dystrophy) (right upper extremity); Chronic low back pain (1ry area of Pain) (Bilateral) (R>L) w/o sciatica; Chronic hip pain (2ry area of Pain) (Right); Chronic wrist pain (3ry area of Pain) (Right) (CRPS); Chronic shoulder pain (Right); and CRPS (complex regional pain syndrome), type I, upper (Right) on their pertinent problem list. Pain Assessment: Severity of Chronic pain is reported as a 2 /10. Location: Back Left/Denies. Onset: More than a month ago. Quality: Stabbing, Aching, Discomfort. Timing: Intermittent. Modifying factor(s): Meds, standing or walking. Vitals:  height is _0  (1.702 m) and weight is 150 lb (68  kg). Her temperature is 97.5 F (36.4 C) (abnormal). Her blood pressure is 141/91 (abnormal) and her pulse is 70. Her oxygen saturation is 99%.   Reason for encounter: medication management.  The patient indicates doing well with the current medication regimen. No adverse reactions or side effects reported to the medications.   UDS ordered today.   RTCB: 06/20/2022  Pharmacotherapy Assessment  Analgesic: Hydrocodone/APAP 10/325, 1 tab PO q 4 hrs (60 mg/day of hydrocodone) MME/day: 60 mg/day.   Monitoring: [place] PMP: PDMP reviewed during this encounter.       Pharmacotherapy: No side-effects or adverse reactions reported. Compliance: No problems identified. Effectiveness: Clinically acceptable.  Chauncey Fischer, RN  03/16/2022  8:18 AM  Sign when Signing Visit Nursing Pain Medication Assessment:  Safety precautions to be maintained throughout the outpatient stay will include: orient to surroundings, keep bed in low position, maintain call bell within reach at all times, provide assistance with transfer out of bed and ambulation.  Medication Inspection Compliance: [name] did not comply with our request to bring her pills to be counted. She was reminded that bringing the medication bottles, even when empty, is a requirement.  Medication: None brought in. Pill/Patch Count: None available to be counted. Bottle Appearance: No container available. Did not bring bottle(s) to appointment. Filled Date: N/A Last Medication intake:  Today Safety precautions to be maintained throughout the outpatient stay will include: orient to surroundings, keep bed in low position, maintain call bell within reach at all times, provide assistance with transfer out of bed and ambulation.     UDS:  Summary  Date Value Ref Range Status  12/07/2020 Note  Final    Comment:    ==================================================================== [employer] Select 13  (MW) ==================================================================== Test  Result       Flag       Units  Drug Present and Declared for Prescription Verification   Hydrocodone                    1314         EXPECTED   ng/mg creat   Hydromorphone                  382          EXPECTED   ng/mg creat   Dihydrocodeine                 217          EXPECTED   ng/mg creat   Norhydrocodone                 >5000        EXPECTED   ng/mg creat    Sources of hydrocodone include scheduled prescription medications.    Hydromorphone, dihydrocodeine and norhydrocodone are expected    metabolites of hydrocodone. Hydromorphone and dihydrocodeine are    also available as scheduled prescription medications.  Drug Absent but Declared for Prescription Verification   Alprazolam                     Not Detected UNEXPECTED ng/mg creat ==================================================================== Test                      Result    Flag   Units      Ref Range   Creatinine              100              mg/dL      >=20 ==================================================================== Declared Medications:  The flagging and interpretation on this report are based on the  following declared medications.  Unexpected results may arise from  inaccuracies in the declared medications.   **Note: The testing scope of this panel includes these medications:   Alprazolam (Xanax)  Hydrocodone (Norco)   **Note: The testing scope of this panel does not include the  following reported medications:   Acetaminophen (Norco)  Acyclovir (Zovirax)  Levothyroxine (Synthroid)  Multivitamin  Naloxone (Narcan)  Naproxen (Aleve)  Valacyclovir (Valtrex)  Venlafaxine (Effexor)  Vitamin D3 ==================================================================== For clinical consultation, please call [phone]. ====================================================================       ROS  Constitutional: Denies any fever or chills Gastrointestinal: No reported hemesis, hematochezia, vomiting, or acute GI distress Musculoskeletal: Denies any acute onset joint swelling, redness, loss of ROM, or weakness Neurological: No reported episodes of acute onset apraxia, aphasia, dysarthria, agnosia, amnesia, paralysis, loss of coordination, or loss of consciousness  Medication Review  ALPRAZolam, HYDROcodone-acetaminophen, Vitamin D3, acyclovir ointment, levothyroxine, multivitamin, naloxone, naproxen sodium, triamcinolone cream, valACYclovir, and venlafaxine XR  History Review  Allergy: [name] is allergic to phenergan [promethazine hcl]. Drug: [name]  reports no history of drug use. Alcohol:  reports no history of alcohol use. Tobacco:  reports that she has never smoked. She has never used smokeless tobacco. Social: [name]  reports that she has never smoked. She has never used smokeless tobacco. She reports that she does not drink alcohol and does not use drugs. Medical:  has a past medical history of Depression, Hypothyroidism, and Thyroid disease. Surgical: [name]  has a past surgical history that includes Spinal cord stimulator implant (2003); Tubal ligation (1990); Fracture surgery (Right); and  Breast cyst excision (Left). Family: family history includes Breast cancer in her maternal grandmother; Breast cancer (age of onset: 33) in her mother; Depression in her mother. She was adopted.  Laboratory Chemistry Profile   Renal Lab Results  Component Value Date   BUN 17 01/10/2022   CREATININE 0.79 01/10/2022   BCR 22 01/10/2022   GFRAA 88 12/10/2019   GFRNONAA 76 12/10/2019    Hepatic Lab Results  Component Value Date   AST 27 01/10/2022   ALT 23 01/10/2022   ALBUMIN 4.9 01/10/2022   ALKPHOS 61 01/10/2022    Electrolytes Lab Results  Component Value Date   NA 143 01/10/2022   K 4.4 01/10/2022   CL 105 01/10/2022   CALCIUM 10.0  01/10/2022   MG 2.1 12/10/2019    Bone Lab Results  Component Value Date   25OHVITD1 84 12/10/2019   25OHVITD2 <1.0 12/10/2019   25OHVITD3 84 12/10/2019    Inflammation (CRP: Acute Phase) (ESR: Chronic Phase) Lab Results  Component Value Date   CRP <1 12/10/2019   ESRSEDRATE 2 12/10/2019         Note: Above Lab results reviewed.  Recent Imaging Review  MM 3D SCREEN BREAST BILATERAL CLINICAL DATA:  Screening.  EXAM: DIGITAL SCREENING BILATERAL MAMMOGRAM WITH TOMOSYNTHESIS AND CAD  TECHNIQUE: Bilateral screening digital craniocaudal and mediolateral oblique mammograms were obtained. Bilateral screening digital breast tomosynthesis was performed. The images were evaluated with computer-aided detection.  COMPARISON:  Previous exam(s).  ACR Breast Density Category c: The breast tissue is heterogeneously dense, which may obscure small masses.  FINDINGS: There are no findings suspicious for malignancy. The images were evaluated with computer-aided detection.  IMPRESSION: No mammographic evidence of malignancy. A result letter of this screening mammogram will be mailed directly to the patient.  RECOMMENDATION: Screening mammogram in one year. (Code:SM-B-01Y)  BI-RADS CATEGORY  1: Negative.  Electronically Signed   By: Nolon Nations M.D.   On: 01/22/2021 12:46 Note: Reviewed        Physical Exam  General appearance: Well nourished, well developed, and well hydrated. In no apparent acute distress Mental status: Alert, oriented x 3 (person, place, & time)       Respiratory: No evidence of acute respiratory distress Eyes: PERLA Vitals: BP (!) 141/91   Pulse 70   Temp (!) 97.5 F (36.4 C)   Ht _0  (1.702 m)   Wt 150 lb (68 kg)   LMP 10/13/2016 (Within Days)   SpO2 99%   BMI 23.49 kg/m  BMI: Estimated body mass index is 23.49 kg/m as calculated from the following:   Height as of this encounter: _1  (1.702 m).   Weight as of this encounter: 150 lb (68  kg). Ideal: Ideal body weight: 61.6 kg (135 lb 12.9 oz) Adjusted ideal body weight: 64.2 kg (141 lb 7.7 oz)  Assessment   Diagnosis Status  1. Chronic pain syndrome   2. Chronic low back pain (1ry area of Pain) (Bilateral) (R>L) w/o sciatica   3. Chronic hip pain (2ry area of Pain) (Right)   4. Chronic wrist pain (3ry area of Pain) (Right) (CRPS)   5. Complex regional pain syndrome type 1 of right upper extremity   6. Pharmacologic therapy   7. Chronic use of opiate for therapeutic purpose   8. Encounter for medication management   9. Encounter for chronic pain management    Controlled Controlled Controlled   Updated Problems: No problems updated.  Plan of Care  Problem-specific:  No problem-specific Assessment & Plan notes found for this encounter.  [name] has a current medication list which includes the following long-term medication(s): levothyroxine, venlafaxine xr, [START ON 03/22/2022] hydrocodone-acetaminophen, [START ON 04/21/2022] hydrocodone-acetaminophen, [START ON 05/21/2022] hydrocodone-acetaminophen, and naloxone.  Pharmacotherapy (Medications Ordered): Meds ordered this encounter  Medications   HYDROcodone-acetaminophen (NORCO) 10-325 MG tablet    Sig: Take 1 tablet by mouth every 4 (four) hours as needed for severe pain. Must last 30 days    Dispense:  180 tablet    Refill:  0    DO NOT: delete (not duplicate); no partial-fill (will deny script to complete), no refill request (F/U required). DISPENSE: 1 day early if closed on fill date. WARN: No CNS-depressants within 8 hrs of med.   HYDROcodone-acetaminophen (NORCO) 10-325 MG tablet    Sig: Take 1 tablet by mouth every 4 (four) hours as needed for severe pain. Must last 30 days    Dispense:  180 tablet    Refill:  0    DO NOT: delete (not duplicate); no partial-fill (will deny script to complete), no refill request (F/U required). DISPENSE: 1 day early if closed on fill date. WARN: No  CNS-depressants within 8 hrs of med.   HYDROcodone-acetaminophen (NORCO) 10-325 MG tablet    Sig: Take 1 tablet by mouth every 4 (four) hours as needed for severe pain. Must last 30 days    Dispense:  180 tablet    Refill:  0    DO NOT: delete (not duplicate); no partial-fill (will deny script to complete), no refill request (F/U required). DISPENSE: 1 day early if closed on fill date. WARN: No CNS-depressants within 8 hrs of med.   Orders:  Orders Placed This Encounter  Procedures   [employer] Select 13 (MW), Urine    Volume: 30 ml(s). Minimum 3 ml of urine is needed. Document temperature of fresh sample. Indications: Long term (current) use of opiate analgesic (P50.932)    Order Specific Question:   Release to patient    Answer:   Immediate   Follow-up plan:   Return in about 3 months (around 06/20/2022) for Eval-day (M,W), (F2F), (MM).     Interventional Therapies  Risk  Complexity Considerations:   Estimated body mass index is 21.93 kg/m as calculated from the following:   Height as of this encounter: _0  (1.702 m).   Weight as of this encounter: 140 lb (63.5 kg). WNL   Planned  Pending:      Under consideration:   Diagnostic bilateral lumbar facet block  Diagnostic IA right hip injection    Completed:   None since before 2020.   Therapeutic  Palliative (PRN) options:   Palliative right stellate ganglion block  Management of Cervical spinal cord stimulator     Recent Visits Date Type Provider Dept  12/22/21 Office Visit Milinda Pointer, MD [facility]  Showing recent visits within past 90 days and meeting all other requirements Today's Visits Date Type Provider Dept  03/16/22 Office Visit Milinda Pointer, MD [facility]  Showing today's visits and meeting all other requirements Future Appointments No visits were found meeting these conditions. Showing future appointments within next 90 days and meeting all other requirements  I  discussed the assessment and treatment plan with the patient. The patient was provided an opportunity to ask questions and all were answered. The patient agreed with the plan and demonstrated an understanding of the instructions.  Patient advised to call back or seek  an in-person evaluation if the symptoms or condition worsens.  Duration of encounter: 30 minutes.  Total time on encounter, as per AMA guidelines included both the face-to-face and non-face-to-face time personally spent by the physician and/or other qualified health care professional(s) on the day of the encounter (includes time in activities that require the physician or other qualified health care professional and does not include time in activities normally performed by clinical staff). Physician's time may include the following activities when performed: preparing to see the patient (eg, review of tests, pre-charting review of records) obtaining and/or reviewing separately obtained history performing a medically appropriate examination and/or evaluation counseling and educating the patient/family/caregiver ordering medications, tests, or procedures referring and communicating with other health care professionals (when not separately reported) documenting clinical information in the electronic or other health record independently interpreting results (not separately reported) and communicating results to the patient/ family/caregiver care coordination (not separately reported)  Note by: Gaspar Cola, MD Date: 03/16/2022; Time: 8:25 AM

## 2022-03-16 ENCOUNTER — Encounter: Payer: Self-pay | Admitting: Pain Medicine

## 2022-03-16 ENCOUNTER — Ambulatory Visit: Payer: Commercial Managed Care - PPO | Attending: Pain Medicine | Admitting: Pain Medicine

## 2022-03-16 VITALS — BP 141/91 | HR 70 | Temp 97.5°F | Ht 67.0 in | Wt 150.0 lb

## 2022-03-16 DIAGNOSIS — M545 Low back pain, unspecified: Secondary | ICD-10-CM | POA: Insufficient documentation

## 2022-03-16 DIAGNOSIS — G90511 Complex regional pain syndrome I of right upper limb: Secondary | ICD-10-CM | POA: Diagnosis present

## 2022-03-16 DIAGNOSIS — M25531 Pain in right wrist: Secondary | ICD-10-CM | POA: Diagnosis not present

## 2022-03-16 DIAGNOSIS — G8929 Other chronic pain: Secondary | ICD-10-CM | POA: Diagnosis present

## 2022-03-16 DIAGNOSIS — M25551 Pain in right hip: Secondary | ICD-10-CM | POA: Insufficient documentation

## 2022-03-16 DIAGNOSIS — G894 Chronic pain syndrome: Secondary | ICD-10-CM | POA: Insufficient documentation

## 2022-03-16 DIAGNOSIS — Z79891 Long term (current) use of opiate analgesic: Secondary | ICD-10-CM | POA: Diagnosis present

## 2022-03-16 DIAGNOSIS — Z79899 Other long term (current) drug therapy: Secondary | ICD-10-CM | POA: Insufficient documentation

## 2022-03-16 MED ORDER — HYDROCODONE-ACETAMINOPHEN 10-325 MG PO TABS: 1.0000 | ORAL_TABLET | ORAL | 0 refills | Status: DC | PRN

## 2022-03-16 NOTE — Progress Notes (Signed)
Nursing Pain Medication Assessment:  Safety precautions to be maintained throughout the outpatient stay will include: orient to surroundings, keep bed in low position, maintain call bell within reach at all times, provide assistance with transfer out of bed and ambulation.  Medication Inspection Compliance: Wendy Snyder did not comply with our request to bring her pills to be counted. She was reminded that bringing the medication bottles, even when empty, is a requirement.  Medication: None brought in. Pill/Patch Count: None available to be counted. Bottle Appearance: No container available. Did not bring bottle(s) to appointment. Filled Date: N/A Last Medication intake:  Today Safety precautions to be maintained throughout the outpatient stay will include: orient to surroundings, keep bed in low position, maintain call bell within reach at all times, provide assistance with transfer out of bed and ambulation.

## 2022-03-16 NOTE — Patient Instructions (Signed)
____________________________________________________________________________________________  Medication Rules  Purpose: To inform patients, and their family members, of our rules and regulations.  Applies to: All patients receiving prescriptions (written or electronic).  Pharmacy of record: Pharmacy where electronic prescriptions will be sent. If written prescriptions are taken to a different pharmacy, please inform the nursing staff. The pharmacy listed in the electronic medical record should be the one where you would like electronic prescriptions to be sent.  Electronic prescriptions: In compliance with the [place] Strengthen Opioid Misuse Prevention (STOP) Act of 2017 (Session Law 2017-74/H243), effective September 19, 2018, all controlled substances must be electronically prescribed. Calling prescriptions to the pharmacy will cease to exist.  Prescription refills: Only during scheduled appointments. Applies to all prescriptions.  NOTE: The following applies primarily to controlled substances (Opioid* Pain Medications).   Type of encounter (visit): For patients receiving controlled substances, face-to-face visits are required. (Not an option or up to the patient.)  Patient's responsibilities: Pain Pills: Bring all pain pills to every appointment (except for procedure appointments). Pill Bottles: Bring pills in original pharmacy bottle. Always bring the newest bottle. Bring bottle, even if empty. Medication refills: You are responsible for knowing and keeping track of what medications you take and those you need refilled. The day before your appointment: write a list of all prescriptions that need to be refilled. The day of the appointment: give the list to the admitting nurse. Prescriptions will be written only during appointments. No prescriptions will be written on procedure days. If you forget a medication: it will not be "Called in", "Faxed", or "electronically sent". You will  need to get another appointment to get these prescribed. No early refills. Do not call asking to have your prescription filled early. Prescription Accuracy: You are responsible for carefully inspecting your prescriptions before leaving our office. Have the discharge nurse carefully go over each prescription with you, before taking them home. Make sure that your name is accurately spelled, that your address is correct. Check the name and dose of your medication to make sure it is accurate. Check the number of pills, and the written instructions to make sure they are clear and accurate. Make sure that you are given enough medication to last until your next medication refill appointment. Taking Medication: Take medication as prescribed. When it comes to controlled substances, taking less pills or less frequently than prescribed is permitted and encouraged. Never take more pills than instructed. Never take medication more frequently than prescribed.  Inform other Doctors: Always inform, all of your healthcare providers, of all the medications you take. Pain Medication from other Providers: You are not allowed to accept any additional pain medication from any other Doctor or Healthcare provider. There are two exceptions to this rule. (see below) In the event that you require additional pain medication, you are responsible for notifying us, as stated below. Cough Medicine: Often these contain an opioid, such as codeine or hydrocodone. Never accept or take cough medicine containing these opioids if you are already taking an opioid* medication. The combination may cause respiratory failure and death. Medication Agreement: You are responsible for carefully reading and following our Medication Agreement. This must be signed before receiving any prescriptions from our practice. Safely store a copy of your signed Agreement. Violations to the Agreement will result in no further prescriptions. (Additional copies of our  Medication Agreement are available upon request.) Laws, Rules, & Regulations: All patients are expected to follow all Federal and State Laws, Statutes, Rules, & Regulations. Ignorance of   the Laws does not constitute a valid excuse.  Illegal drugs and Controlled Substances: The use of illegal substances (including, but not limited to marijuana and its derivatives) and/or the illegal use of any controlled substances is strictly prohibited. Violation of this rule may result in the immediate and permanent discontinuation of any and all prescriptions being written by our practice. The use of any illegal substances is prohibited. Adopted CDC guidelines & recommendations: Target dosing levels will be at or below 60 MME/day. Use of benzodiazepines** is not recommended.  Exceptions: There are only two exceptions to the rule of not receiving pain medications from other Healthcare Providers. Exception #1 (Emergencies): In the event of an emergency (i.e.: accident requiring emergency care), you are allowed to receive additional pain medication. However, you are responsible for: As soon as you are able, call our office [phone], at any time of the day or night, and leave a message stating your name, the date and nature of the emergency, and the name and dose of the medication prescribed. In the event that your call is answered by a member of our staff, make sure to document and save the date, time, and the name of the person that took your information.  Exception #2 (Planned Surgery): In the event that you are scheduled by another doctor or dentist to have any type of surgery or procedure, you are allowed (for a period no longer than 30 days), to receive additional pain medication, for the acute post-op pain. However, in this case, you are responsible for picking up a copy of our "Post-op Pain Management for Surgeons" handout, and giving it to your surgeon or dentist. This document is available at our office, and  does not require an appointment to obtain it. Simply go to our office during business hours (Monday-Thursday from 8:00 AM to 4:00 PM) (Friday 8:00 AM to 12:00 Noon) or if you have a scheduled appointment with us, prior to your surgery, and ask for it by name. In addition, you are responsible for: calling our office [phone], at any time of the day or night, and leaving a message stating your name, name of your surgeon, type of surgery, and date of procedure or surgery. Failure to comply with your responsibilities may result in termination of therapy involving the controlled substances. Medication Agreement Violation. Following the above rules, including your responsibilities will help you in avoiding a Medication Agreement Violation ("Breaking your Pain Medication Contract").  *Opioid medications include: morphine, codeine, oxycodone, oxymorphone, hydrocodone, hydromorphone, meperidine, tramadol, tapentadol, buprenorphine, fentanyl, methadone. **Benzodiazepine medications include: diazepam (Valium), alprazolam (Xanax), clonazepam (Klonopine), lorazepam (Ativan), clorazepate (Tranxene), chlordiazepoxide (Librium), estazolam (Prosom), oxazepam (Serax), temazepam (Restoril), triazolam (Halcion) (Last updated: 06/16/2021) ____________________________________________________________________________________________  ____________________________________________________________________________________________  Medication Recommendations and Reminders  Applies to: All patients receiving prescriptions (written and/or electronic).  Medication Rules & Regulations: These rules and regulations exist for your safety and that of others. They are not flexible and neither are we. Dismissing or ignoring them will be considered "non-compliance" with medication therapy, resulting in complete and irreversible termination of such therapy. (See document titled "Medication Rules" for more details.) In all conscience,  because of safety reasons, we cannot continue providing a therapy where the patient does not follow instructions.  Pharmacy of record:  Definition: This is the pharmacy where your electronic prescriptions will be sent.  We do not endorse any particular pharmacy, however, we have experienced problems with [facility] not securing enough medication supply for the community. We do not restrict you   in your choice of pharmacy. However, once we write for your prescriptions, we will NOT be re-sending more prescriptions to fix restricted supply problems created by your pharmacy, or your insurance.  The pharmacy listed in the electronic medical record should be the one where you want electronic prescriptions to be sent. If you choose to change pharmacy, simply notify our nursing staff.  Recommendations: Keep all of your pain medications in a safe place, under lock and key, even if you live alone. We will NOT replace lost, stolen, or damaged medication. After you fill your prescription, take 1 week's worth of pills and put them away in a safe place. You should keep a separate, properly labeled bottle for this purpose. The remainder should be kept in the original bottle. Use this as your primary supply, until it runs out. Once it's gone, then you know that you have 1 week's worth of medicine, and it is time to come in for a prescription refill. If you do this correctly, it is unlikely that you will ever run out of medicine. To make sure that the above recommendation works, it is very important that you make sure your medication refill appointments are scheduled at least 1 week before you run out of medicine. To do this in an effective manner, make sure that you do not leave the office without scheduling your next medication management appointment. Always ask the nursing staff to show you in your prescription , when your medication will be running out. Then arrange for the receptionist to get you a return appointment,  at least 7 days before you run out of medicine. Do not wait until you have 1 or 2 pills left, to come in. This is very poor planning and does not take into consideration that we may need to cancel appointments due to bad weather, sickness, or emergencies affecting our staff. DO NOT ACCEPT A "Partial Fill": If for any reason your pharmacy does not have enough pills/tablets to completely fill or refill your prescription, do not allow for a "partial fill". The law allows the pharmacy to complete that prescription within 72 hours, without requiring a new prescription. If they do not fill the rest of your prescription within those 72 hours, you will need a separate prescription to fill the remaining amount, which we will NOT provide. If the reason for the partial fill is your insurance, you will need to talk to the pharmacist about payment alternatives for the remaining tablets, but again, DO NOT ACCEPT A PARTIAL FILL, unless you can trust your pharmacist to obtain the remainder of the pills within 72 hours.  Prescription refills and/or changes in medication(s):  Prescription refills, and/or changes in dose or medication, will be conducted only during scheduled medication management appointments. (Applies to both, written and electronic prescriptions.) No refills on procedure days. No medication will be changed or started on procedure days. No changes, adjustments, and/or refills will be conducted on a procedure day. Doing so will interfere with the diagnostic portion of the procedure. No phone refills. No medications will be "called into the pharmacy". No Fax refills. No weekend refills. No Holliday refills. No after hours refills.  Remember:  Business hours are:  Monday to Thursday 8:00 AM to 4:00 PM Provider's Schedule: Markhi Kleckner, MD - Appointments are:  Medication management: Monday and Wednesday 8:00 AM to 4:00 PM Procedure day: Tuesday and Thursday 7:30 AM to 4:00 PM Bilal Lateef, MD -  Appointments are:  Medication management: Tuesday and Thursday 8:00   AM to 4:00 PM Procedure day: Monday and Wednesday 7:30 AM to 4:00 PM (Last update: 04/08/2020) ____________________________________________________________________________________________  ____________________________________________________________________________________________  CBD (cannabidiol) & Delta-8 (Delta-8 tetrahydrocannabinol) WARNING  Intro: Cannabidiol (CBD) and tetrahydrocannabinol (THC), are two natural compounds found in plants of the Cannabis genus. They can both be extracted from hemp or cannabis. Hemp and cannabis come from the Cannabis sativa plant. Both compounds interact with your body's endocannabinoid system, but they have very different effects. CBD does not produce the high sensation associated with cannabis. Delta-8 tetrahydrocannabinol, also known as delta-8 THC, is a psychoactive substance found in the Cannabis sativa plant, of which marijuana and hemp are two varieties. THC is responsible for the high associated with the illicit use of marijuana.  Applicable to: All individuals currently taking or considering taking CBD (cannabidiol) and, more important, all patients taking opioid analgesic controlled substances (pain medication). (Example: oxycodone; oxymorphone; hydrocodone; hydromorphone; morphine; methadone; tramadol; tapentadol; fentanyl; buprenorphine; butorphanol; dextromethorphan; meperidine; codeine; etc.)  Legal status: CBD remains a Schedule I drug prohibited for any use. CBD is illegal with one exception. In the [place], CBD has a limited Food and Drug Administration (FDA) approval for the treatment of two specific types of epilepsy disorders. Only one CBD product has been approved by the FDA for this purpose: "Epidiolex". FDA is aware that some companies are marketing products containing cannabis and cannabis-derived compounds in ways that violate the Federal Food, Drug and Cosmetic Act  (FD&C Act) and that may put the health and safety of consumers at risk. The FDA, a Federal agency, has not enforced the CBD status since 2018. UPDATE: (11/05/2021) The Drug Enforcement Agency (DEA) issued a letter stating that "delta" cannabinoids, including Delta-8-THCO and Delta-9-THCO, synthetically derived from hemp do not qualify as hemp and will be viewed as Schedule I drugs. (Schedule I drugs, substances, or chemicals are defined as drugs with no currently accepted medical use and a high potential for abuse. Some examples of Schedule I drugs are: heroin, lysergic acid diethylamide (LSD), marijuana (cannabis), 3,4-methylenedioxymethamphetamine (ecstasy), methaqualone, and peyote.) (https://www.dea.gov)  Legality: Some manufacturers ship CBD products nationally, which is illegal. Often such products are sold online and are therefore available throughout the country. CBD is openly sold in head shops and health food stores in some states where such sales have not been explicitly legalized. Selling unapproved products with unsubstantiated therapeutic claims is not only a violation of the law, but also can put patients at risk, as these products have not been proven to be safe or effective. Federal illegality makes it difficult to conduct research on CBD.  Reference: "FDA Regulation of Cannabis and Cannabis-Derived Products, Including Cannabidiol (CBD)" - https://www.fda.gov/news-events/public-health-focus/fda-regulation-cannabis-and-cannabis-derived-products-including-cannabidiol-cbd  Warning: CBD is not FDA approved and has not undergo the same manufacturing controls as prescription drugs.  This means that the purity and safety of available CBD may be questionable. Most of the time, despite manufacturer's claims, it is contaminated with THC (delta-9-tetrahydrocannabinol - the chemical in marijuana responsible for the "HIGH").  When this is the case, the THC contaminant will trigger a positive urine drug  screen (UDS) test for Marijuana (carboxy-THC). Because a positive UDS for any illicit substance is a violation of our medication agreement, your opioid analgesics (pain medicine) may be permanently discontinued. The FDA recently put out a warning about 5 things that everyone should be aware of regarding Delta-8 THC: Delta-8 THC products have not been evaluated or approved by the FDA for safe use and may be marketed in ways that put the   public health at risk. The FDA has received adverse event reports involving delta-8 THC-containing products. Delta-8 THC has psychoactive and intoxicating effects. Delta-8 THC manufacturing often involve use of potentially harmful chemicals to create the concentrations of delta-8 THC claimed in the marketplace. The final delta-8 THC product may have potentially harmful by-products (contaminants) due to the chemicals used in the process. Manufacturing of delta-8 THC products may occur in uncontrolled or unsanitary settings, which may lead to the presence of unsafe contaminants or other potentially harmful substances. Delta-8 THC products should be kept out of the reach of children and pets.  MORE ABOUT CBD  General Information: CBD was discovered in 1940 and it is a derivative of the cannabis sativa genus plants (Marijuana and Hemp). It is one of the 113 identified substances found in Marijuana. It accounts for up to 40% of the plant's extract. As of 2018, preliminary clinical studies on CBD included research for the treatment of anxiety, movement disorders, and pain. CBD is available and consumed in multiple forms, including inhalation of smoke or vapor, as an aerosol spray, and by mouth. It may be supplied as an oil containing CBD, capsules, dried cannabis, or as a liquid solution. CBD is thought not to be as psychoactive as THC (delta-9-tetrahydrocannabinol - the chemical in marijuana responsible for the "HIGH"). Studies suggest that CBD may interact with different  biological target receptors in the body, including cannabinoid and other neurotransmitter receptors. As of 2018 the mechanism of action for its biological effects has not been determined.  Side-effects  Adverse reactions: Dry mouth, diarrhea, decreased appetite, fatigue, drowsiness, malaise, weakness, sleep disturbances, and others.  Drug interactions: CBC may interact with other medications such as blood-thinners. Because CBD causes drowsiness on its own, it also increases the drowsiness caused by other medications, including antihistamines (such as Benadryl), benzodiazepines (Xanax, Ativan, Valium), antipsychotics, antidepressants and opioids, as well as alcohol and supplements such as kava, melatonin and St. John's Wort. Be cautious with the following combinations:   Brivaracetam (Briviact) Brivaracetam is changed and broken down by the body. CBD might decrease how quickly the body breaks down brivaracetam. This might increase levels of brivaracetam in the body.  Caffeine Caffeine is changed and broken down by the body. CBD might decrease how quickly the body breaks down caffeine. This might increase levels of caffeine in the body.  Carbamazepine (Tegretol) Carbamazepine is changed and broken down by the body. CBD might decrease how quickly the body breaks down carbamazepine. This might increase levels of carbamazepine in the body and increase its side effects.  Citalopram (Celexa) Citalopram is changed and broken down by the body. CBD might decrease how quickly the body breaks down citalopram. This might increase levels of citalopram in the body and increase its side effects.  Clobazam (Onfi) Clobazam is changed and broken down by the liver. CBD might decrease how quickly the liver breaks down clobazam. This might increase the effects and side effects of clobazam.  Eslicarbazepine (Aptiom) Eslicarbazepine is changed and broken down by the body. CBD might decrease how quickly the body  breaks down eslicarbazepine. This might increase levels of eslicarbazepine in the body by a small amount.  Everolimus (Zostress) Everolimus is changed and broken down by the body. CBD might decrease how quickly the body breaks down everolimus. This might increase levels of everolimus in the body.  Lithium Taking higher doses of CBD might increase levels of lithium. This can increase the risk of lithium toxicity.  Medications changed by the liver (  Cytochrome P450 1A1 (CYP1A1) substrates) Some medications are changed and broken down by the liver. CBD might change how quickly the liver breaks down these medications. This could change the effects and side effects of these medications.  Medications changed by the liver (Cytochrome P450 1A2 (CYP1A2) substrates) Some medications are changed and broken down by the liver. CBD might change how quickly the liver breaks down these medications. This could change the effects and side effects of these medications.  Medications changed by the liver (Cytochrome P450 1B1 (CYP1B1) substrates) Some medications are changed and broken down by the liver. CBD might change how quickly the liver breaks down these medications. This could change the effects and side effects of these medications.  Medications changed by the liver (Cytochrome P450 2A6 (CYP2A6) substrates) Some medications are changed and broken down by the liver. CBD might change how quickly the liver breaks down these medications. This could change the effects and side effects of these medications.  Medications changed by the liver (Cytochrome P450 2B6 (CYP2B6) substrates) Some medications are changed and broken down by the liver. CBD might change how quickly the liver breaks down these medications. This could change the effects and side effects of these medications.  Medications changed by the liver (Cytochrome P450 2C19 (CYP2C19) substrates) Some medications are changed and broken down by the liver.  CBD might change how quickly the liver breaks down these medications. This could change the effects and side effects of these medications.  Medications changed by the liver (Cytochrome P450 2C8 (CYP2C8) substrates) Some medications are changed and broken down by the liver. CBD might change how quickly the liver breaks down these medications. This could change the effects and side effects of these medications.  Medications changed by the liver (Cytochrome P450 2C9 (CYP2C9) substrates) Some medications are changed and broken down by the liver. CBD might change how quickly the liver breaks down these medications. This could change the effects and side effects of these medications.  Medications changed by the liver (Cytochrome P450 2D6 (CYP2D6) substrates) Some medications are changed and broken down by the liver. CBD might change how quickly the liver breaks down these medications. This could change the effects and side effects of these medications.  Medications changed by the liver (Cytochrome P450 2E1 (CYP2E1) substrates) Some medications are changed and broken down by the liver. CBD might change how quickly the liver breaks down these medications. This could change the effects and side effects of these medications.  Medications changed by the liver (Cytochrome P450 3A4 (CYP3A4) substrates) Some medications are changed and broken down by the liver. CBD might change how quickly the liver breaks down these medications. This could change the effects and side effects of these medications.  Medications changed by the liver (Glucuronidated drugs) Some medications are changed and broken down by the liver. CBD might change how quickly the liver breaks down these medications. This could change the effects and side effects of these medications.  Medications that decrease the breakdown of other medications by the liver (Cytochrome P450 2C19 (CYP2C19) inhibitors) CBD is changed and broken down by the liver.  Some drugs decrease how quickly the liver changes and breaks down CBD. This could change the effects and side effects of CBD.  Medications that decrease the breakdown of other medications in the liver (Cytochrome P450 3A4 (CYP3A4) inhibitors) CBD is changed and broken down by the liver. Some drugs decrease how quickly the liver changes and breaks down CBD. This could change the effects   and side effects of CBD.  Medications that increase breakdown of other medications by the liver (Cytochrome P450 3A4 (CYP3A4) inducers) CBD is changed and broken down by the liver. Some drugs increase how quickly the liver changes and breaks down CBD. This could change the effects and side effects of CBD.  Medications that increase the breakdown of other medications by the liver (Cytochrome P450 2C19 (CYP2C19) inducers) CBD is changed and broken down by the liver. Some drugs increase how quickly the liver changes and breaks down CBD. This could change the effects and side effects of CBD.  Methadone (Dolophine) Methadone is broken down by the liver. CBD might decrease how quickly the liver breaks down methadone. Taking cannabidiol along with methadone might increase the effects and side effects of methadone.  Rufinamide (Banzel) Rufinamide is changed and broken down by the body. CBD might decrease how quickly the body breaks down rufinamide. This might increase levels of rufinamide in the body by a small amount.  Sedative medications (CNS depressants) CBD might cause sleepiness and slowed breathing. Some medications, called sedatives, can also cause sleepiness and slowed breathing. Taking CBD with sedative medications might cause breathing problems and/or too much sleepiness.  Sirolimus (Rapamune) Sirolimus is changed and broken down by the body. CBD might decrease how quickly the body breaks down sirolimus. This might increase levels of sirolimus in the body.  Stiripentol (Diacomit) Stiripentol is changed and  broken down by the body. CBD might decrease how quickly the body breaks down stiripentol. This might increase levels of stiripentol in the body and increase its side effects.  Tacrolimus (Prograf) Tacrolimus is changed and broken down by the body. CBD might decrease how quickly the body breaks down tacrolimus. This might increase levels of tacrolimus in the body.  Tamoxifen (Soltamox) Tamoxifen is changed and broken down by the body. CBD might affect how quickly the body breaks down tamoxifen. This might affect levels of tamoxifen in the body.  Topiramate (Topamax) Topiramate is changed and broken down by the body. CBD might decrease how quickly the body breaks down topiramate. This might increase levels of topiramate in the body by a small amount.  Valproate Valproic acid can cause liver injury. Taking cannabidiol with valproic acid might increase the chance of liver injury. CBD and/or valproic acid might need to be stopped, or the dose might need to be reduced.  Warfarin (Coumadin) CBD might increase levels of warfarin, which can increase the risk for bleeding. CBD and/or warfarin might need to be stopped, or the dose might need to be reduced.  Zonisamide Zonisamide is changed and broken down by the body. CBD might decrease how quickly the body breaks down zonisamide. This might increase levels of zonisamide in the body by a small amount. (Last update: 11/17/2021) ____________________________________________________________________________________________  ____________________________________________________________________________________________  Drug Holidays (Slow)  What is a "Drug Holiday"? Drug Holiday: is the name given to the period of time during which a patient stops taking a medication(s) for the purpose of eliminating tolerance to the drug.  Benefits Improved effectiveness of opioids. Decreased opioid dose needed to achieve benefits. Improved pain with lesser  dose.  What is tolerance? Tolerance: is the progressive decreased in effectiveness of a drug due to its repetitive use. With repetitive use, the body gets use to the medication and as a consequence, it loses its effectiveness. This is a common problem seen with opioid pain medications. As a result, a larger dose of the drug is needed to achieve the same effect that   used to be obtained with a smaller dose.  How long should a "Drug Holiday" last? You should stay off of the pain medicine for at least 14 consecutive days. (2 weeks)  Should I stop the medicine "cold turkey"? No. You should always coordinate with your Pain Specialist so that he/she can provide you with the correct medication dose to make the transition as smoothly as possible.  How do I stop the medicine? Slowly. You will be instructed to decrease the daily amount of pills that you take by one (1) pill every seven (7) days. This is called a "slow downward taper" of your dose. For example: if you normally take four (4) pills per day, you will be asked to drop this dose to three (3) pills per day for seven (7) days, then to two (2) pills per day for seven (7) days, then to one (1) per day for seven (7) days, and at the end of those last seven (7) days, this is when the "Drug Holiday" would start.   Will I have withdrawals? By doing a "slow downward taper" like this one, it is unlikely that you will experience any significant withdrawal symptoms. Typically, what triggers withdrawals is the sudden stop of a high dose opioid therapy. Withdrawals can usually be avoided by slowly decreasing the dose over a prolonged period of time. If you do not follow these instructions and decide to stop your medication abruptly, withdrawals may be possible.  What are withdrawals? Withdrawals: refers to the wide range of symptoms that occur after stopping or dramatically reducing opiate drugs after heavy and prolonged use. Withdrawal symptoms do not occur to  patients that use low dose opioids, or those who take the medication sporadically. Contrary to benzodiazepine (example: Valium, Xanax, etc.) or alcohol withdrawals ("Delirium Tremens"), opioid withdrawals are not lethal. Withdrawals are the physical manifestation of the body getting rid of the excess receptors.  Expected Symptoms Early symptoms of withdrawal may include: Agitation Anxiety Muscle aches Increased tearing Insomnia Runny nose Sweating Yawning  Late symptoms of withdrawal may include: Abdominal cramping Diarrhea Dilated pupils Goose bumps Nausea Vomiting  Will I experience withdrawals? Due to the slow nature of the taper, it is very unlikely that you will experience any.  What is a slow taper? Taper: refers to the gradual decrease in dose.  (Last update: 04/08/2020) ____________________________________________________________________________________________   ____________________________________________________________________________________________  Pharmacy Shortages of Pain Medication   Introduction Shockingly as it may seem, .  "No U.S. Supreme Court decision has ever interpreted the Constitution as guaranteeing a right to health care for all Americans." - https://www.healthequityandpolicylab.com/elusive-right-to-health-care-under-us-law  "With respect to human rights, the [place] has no formally codified right to health, nor does it participate in a human rights treaty that specifies a right to health." - Scott J. Schweikart, JD, MBE  Situation By now, most of our patients have had the experience of being told by their pharmacist that they do not have enough medication to cover their prescription. If you have not had this experience, just know that you soon will.  Problem There appears to be a shortage of these medications, either at the national level or locally. This is happening with all pharmacies. When there is not enough medication, patients  are offered a partial fill and they are told that they will try to get the rest of the medicine for them at a later time. If they do not have enough for even a partial fill, the pharmacists are telling the patients to   call us (the prescribing physicians) to request that we send another prescription to another pharmacy to get the medicine.   This reordering of a controlled substance creates documentation problems where additional paperwork needs to be created to explain why two prescriptions for the same period of time and the same medicine are being prescribed to the same patient. It also creates situations where the last appointment note does not accurately reflect when and what prescriptions were given to a patient. This leads to prescribing errors down the line, in subsequent follow-up visits.   [place] Board of Pharmacy (NCBOP) Research revealed that Board of Pharmacy Rule .1806 (21 NCAC 46.1806) authorizes pharmacists to the transfer of prescriptions among pharmacies, and it sets forth procedural and recordkeeping requirements for doing so. However, this requires the pharmacist to complete the previously mentioned procedural paperwork to accomplish the transfer. As it turns out, it is much easier for them to have the prescribing physicians do the work.   Possible solutions 1. Have the [place] Assembly add a provision to the "STOP ACT" (the law that mandates how controlled substances are prescribed) where there is an exception to the electronic prescribing rule that states that in the event there are shortages of medications the physicians are allowed to use written prescriptions as opposed to electronic ones. This would allow patients to take their prescriptions to a different pharmacy that may have enough medication available to fill the prescription. The problem is that currently there is a law that does not allow for written prescriptions, with the exception of instances where the  electronic medical record is down due to technical issues.  2. Have [place] Congress ease the pressure on pharmaceutical companies, allowing them to produce enough quantities of the medication to adequately supply the population. 3. Have pharmacies keep enough stocks of these medications to cover their client base.  4. Have the [place] State Assembly add a provision to the "STOP ACT" where they ease the regulations surrounding the transfer of controlled substances between pharmacies, so as to simplify the transfer of supplies. As an alternative, develop a system to allow patients to obtain the remainder of their prescription at another one of their pharmacies or at an associate pharmacy.   How this shortage will affect you.  The one thing that is abundantly clear is that this is a pharmacy supply problem  and not a prescriber problem. The job of the prescriber is to evaluate and monitor the patients for the appropriate indications to the use of these medicines, the monitoring of their use and the prescribing of the appropriate dose and regimen. It is not the job of the prescriber to provide or dispense the actual medication. By law, this is the job of the pharmacies and pharmacists. It is certainly not the job of the prescriber to solve the supply problems.   Due to the above problems we are no longer taking patients to write for their pain medication. We will continue to evaluate for appropriate indications and we may provide recommendations regarding medication, dose, and schedule, as well as monitoring recommendations, however, we will not be taking over the actual prescribing of these substances. On those patients where we are treating their chronic pain with interventional therapies, exceptions will be considered on a case by case basis. At this time, we will try to continue providing this supplemental service to those patients we have been managing in the past. However, as of August 1st, 2023, we no  longer   will be sending additional prescriptions to other pharmacies for the purpose of solving their supply problems. Once we send a prescription to a pharmacy, we will not be resending it again to another pharmacy to cover for their shortages.   What to do. Write as many letters as you can. Recruit the help of family members in writing these letters. Below are some of the places where you can write to make your voice heard. Let them know what the problem is and push them to look for solutions.   Search internet for: "[place] find your legislators" https://www.ncleg.gov/findyourlegislators  Search internet for: "[place] insurance commissioner complaints" https://www.ncdoi.gov/contactscomplaints/assistance-or-file-complaint  Search internet for: "[place] Board of Pharmacy complaints" http://www.ncbop.org/contact.htm  Search internet for: "[facility] pharmacy complaints" Email [facility] Customer Relations https://www.cvs.com/help/email-customer-relations.jsp?callType=store  Search internet for: "[facility] pharmacy customer service complaints" https://[facility].com/topic/marketing/contactus/contactus_customerservice.jsp  ____________________________________________________________________________________________   

## 2022-03-21 LAB — TOXASSURE SELECT 13 (MW), URINE

## 2022-03-28 ENCOUNTER — Telehealth: Payer: Self-pay | Admitting: Pain Medicine

## 2022-03-28 NOTE — Telephone Encounter (Signed)
Patient hasnt had meds, Hydrocodone, in 4 days. She is going to call around and find pharmacy that does have it. She will call back

## 2022-03-31 ENCOUNTER — Other Ambulatory Visit: Payer: Self-pay | Admitting: Internal Medicine

## 2022-03-31 DIAGNOSIS — R21 Rash and other nonspecific skin eruption: Secondary | ICD-10-CM

## 2022-04-01 NOTE — Telephone Encounter (Signed)
Requested medication (s) are due for refill today:   Provider to review  Requested medication (s) are on the active medication list:   Yes  Future visit scheduled:   Yes  Had CPE 2 mo. ago   Last ordered: 01/10/2022 30 g, 0 refills   Non delegated refill reason returned  Requested Prescriptions  Pending Prescriptions Disp Refills   triamcinolone cream (KENALOG) 0.5 % [Pharmacy Med Name: TRIAMCINOLONE 0.5% CREAM] 30 g 0    Sig: Apply 1 application. topically 3 (three) times daily. To rash on leg     Not Delegated - Dermatology:  Corticosteroids Failed - 03/31/2022  9:19 PM      Failed - This refill cannot be delegated      Passed - Valid encounter within last 12 months    Recent Outpatient Visits           2 months ago Annual physical exam   [facility] Reubin Milan, MD   1 year ago Annual physical exam   [facility] Reubin Milan, MD   2 years ago Annual physical exam   [facility] Reubin Milan, MD   2 years ago Hypothyroidism due to acquired atrophy of thyroid   [facility] Reubin Milan, MD   3 years ago Streptococcal pharyngitis   [facility] Reubin Milan, MD       Future Appointments             In 9 months Judithann Graves Nyoka Cowden, MD [facility], [facility]

## 2022-04-02 ENCOUNTER — Other Ambulatory Visit: Payer: Self-pay | Admitting: Internal Medicine

## 2022-04-02 DIAGNOSIS — F064 Anxiety disorder due to known physiological condition: Secondary | ICD-10-CM

## 2022-04-04 NOTE — Telephone Encounter (Signed)
Requested Prescriptions  Pending Prescriptions Disp Refills  . venlafaxine XR (EFFEXOR-XR) 75 MG 24 hr capsule [Pharmacy Med Name: VENLAFAXINE HCL ER 75 MG CAP] 90 capsule 0    Sig: TAKE 1 CAPSULE BY MOUTH EVERY DAY     Psychiatry: Antidepressants - SNRI - desvenlafaxine & venlafaxine Failed - 04/02/2022  9:37 AM      Failed - Last BP in normal range    BP Readings from Last 1 Encounters:  03/16/22 (!) 141/91         Failed - Lipid Panel in normal range within the last 12 months    Cholesterol, Total  Date Value Ref Range Status  01/10/2022 162 100 - 199 mg/dL Final   LDL Chol Calc (NIH)  Date Value Ref Range Status  01/10/2022 70 0 - 99 mg/dL Final   HDL  Date Value Ref Range Status  01/10/2022 75 >39 mg/dL Final   Triglycerides  Date Value Ref Range Status  01/10/2022 96 0 - 149 mg/dL Final         Passed - Cr in normal range and within 360 days    Creatinine  Date Value Ref Range Status  11/08/2013 0.93 0.60 - 1.30 mg/dL Final   Creatinine, Ser  Date Value Ref Range Status  01/10/2022 0.79 0.57 - 1.00 mg/dL Final         Passed - Completed PHQ-2 or PHQ-9 in the last 360 days      Passed - Valid encounter within last 6 months    Recent Outpatient Visits          2 months ago Annual physical exam   [facility] Reubin Milan, MD   1 year ago Annual physical exam   [facility] Reubin Milan, MD   2 years ago Annual physical exam   [facility] Reubin Milan, MD   2 years ago Hypothyroidism due to acquired atrophy of thyroid   [facility] Reubin Milan, MD   3 years ago Streptococcal pharyngitis   [facility] Reubin Milan, MD      Future Appointments            In 9 months Judithann Graves Nyoka Cowden, MD [facility], [facility]

## 2022-05-01 ENCOUNTER — Other Ambulatory Visit: Payer: Self-pay | Admitting: Internal Medicine

## 2022-05-01 DIAGNOSIS — F064 Anxiety disorder due to known physiological condition: Secondary | ICD-10-CM

## 2022-05-02 NOTE — Telephone Encounter (Signed)
Please review. Last office visit 01/10/22.  Wendy Snyder

## 2022-05-02 NOTE — Telephone Encounter (Signed)
Requested medication (s) are due for refill today: yes  Requested medication (s) are on the active medication list: yes  Last refill:  01/28/22 #60 with 2 RF  Future visit scheduled: 01/12/2023, last visit 01/10/2022  Notes to clinic:  This medication can not be delegated, please assess.        Requested Prescriptions  Pending Prescriptions Disp Refills   ALPRAZolam (XANAX) 0.5 MG tablet [Pharmacy Med Name: ALPRAZOLAM 0.5 MG TABLET] 60 tablet     Sig: TAKE 1 TABLET BY MOUTH TWICE A DAY AS NEEDED FOR ANXIETY     Not Delegated - Psychiatry: Anxiolytics/Hypnotics 2 Failed - 05/01/2022  9:53 AM      Failed - This refill cannot be delegated      Failed - Urine Drug Screen completed in last 360 days      Passed - Patient is not pregnant      Passed - Valid encounter within last 6 months    Recent Outpatient Visits           3 months ago Annual physical exam   [facility] Reubin Milan, MD   1 year ago Annual physical exam   [facility] Reubin Milan, MD   2 years ago Annual physical exam   [facility] Reubin Milan, MD   2 years ago Hypothyroidism due to acquired atrophy of thyroid   [facility] Reubin Milan, MD   3 years ago Streptococcal pharyngitis   [facility] Reubin Milan, MD       Future Appointments             In 8 months Judithann Graves Nyoka Cowden, MD [facility], [facility]

## 2022-05-06 ENCOUNTER — Other Ambulatory Visit: Payer: Self-pay | Admitting: Internal Medicine

## 2022-05-06 DIAGNOSIS — E034 Atrophy of thyroid (acquired): Secondary | ICD-10-CM

## 2022-05-06 NOTE — Telephone Encounter (Signed)
Requested Prescriptions  Pending Prescriptions Disp Refills  . levothyroxine (SYNTHROID) 50 MCG tablet [Pharmacy Med Name: LEVOTHYROXINE 50 MCG TABLET] 90 tablet 1    Sig: TAKE 1 TABLET BY MOUTH EVERY DAY     Endocrinology:  Hypothyroid Agents Passed - 05/06/2022  2:28 AM      Passed - TSH in normal range and within 360 days    TSH  Date Value Ref Range Status  01/10/2022 1.460 0.450 - 4.500 uIU/mL Final         Passed - Valid encounter within last 12 months    Recent Outpatient Visits          3 months ago Annual physical exam   [place] [facility], Nyoka Cowden, MD   1 year ago Annual physical exam   [place] [facility], Nyoka Cowden, MD   2 years ago Annual physical exam   [place] [facility] at [facility], Nyoka Cowden, MD   3 years ago Hypothyroidism due to acquired atrophy of thyroid   [place] [facility], Nyoka Cowden, MD   3 years ago Streptococcal pharyngitis   [place] [facility] at [facility], Nyoka Cowden, MD      Future Appointments            In 8 months Judithann Graves, Nyoka Cowden, MD [facility] at [facility], [facility]

## 2022-06-04 ENCOUNTER — Other Ambulatory Visit: Payer: Self-pay | Admitting: Internal Medicine

## 2022-06-04 DIAGNOSIS — F064 Anxiety disorder due to known physiological condition: Secondary | ICD-10-CM

## 2022-06-06 NOTE — Telephone Encounter (Signed)
Requested medication (s) are due for refill today: yes  Requested medication (s) are on the active medication list: yes  Last refill:  05/02/22 #60 0 refills  Future visit scheduled: yes in 7 months  Notes to clinic:  not delegated per protocol. Do you want to refill Rx?     Requested Prescriptions  Pending Prescriptions Disp Refills   ALPRAZolam (XANAX) 0.5 MG tablet [Pharmacy Med Name: ALPRAZOLAM 0.5 MG TABLET] 60 tablet 0    Sig: TAKE 1 TABLET BY MOUTH TWICE A DAY AS NEEDED FOR ANXIETY     Not Delegated - Psychiatry: Anxiolytics/Hypnotics 2 Failed - 06/04/2022 11:52 AM      Failed - This refill cannot be delegated      Failed - Urine Drug Screen completed in last 360 days      Passed - Patient is not pregnant      Passed - Valid encounter within last 6 months    Recent Outpatient Visits           4 months ago Annual physical exam   [place] [facility], Jesse Sans, MD   1 year ago Annual physical exam   [place] [facility] at [facility], Jesse Sans, MD   2 years ago Annual physical exam   [place] [facility] at [facility], Jesse Sans, MD   3 years ago Hypothyroidism due to acquired atrophy of thyroid   [place] [facility] at [facility], Jesse Sans, MD   3 years ago Streptococcal pharyngitis   [place] [facility] at [facility], Jesse Sans, MD       Future Appointments             In 7 months Glean Hess, MD [facility] at [facility], [facility]            Refused Prescriptions Disp Refills   venlafaxine XR (EFFEXOR-XR) 75 MG 24 hr capsule [Pharmacy Med Name: VENLAFAXINE HCL ER 75 MG CAP] 30 capsule 2    Sig: TAKE 1 CAPSULE BY MOUTH EVERY DAY     Psychiatry: Antidepressants - SNRI - desvenlafaxine & venlafaxine Failed - 06/04/2022  11:52 AM      Failed - Last BP in normal range    BP Readings from Last 1 Encounters:  03/16/22 (!) 141/91         Failed - Lipid Panel in normal range within the last 12 months    Cholesterol, Total  Date Value Ref Range Status  01/10/2022 162 100 - 199 mg/dL Final   LDL Chol Calc (NIH)  Date Value Ref Range Status  01/10/2022 70 0 - 99 mg/dL Final   HDL  Date Value Ref Range Status  01/10/2022 75 >39 mg/dL Final   Triglycerides  Date Value Ref Range Status  01/10/2022 96 0 - 149 mg/dL Final         Passed - Cr in normal range and within 360 days    Creatinine  Date Value Ref Range Status  11/08/2013 0.93 0.60 - 1.30 mg/dL Final   Creatinine, Ser  Date Value Ref Range Status  01/10/2022 0.79 0.57 - 1.00 mg/dL Final         Passed - Completed PHQ-2 or PHQ-9 in the last 360 days      Passed - Valid encounter  within last 6 months    Recent Outpatient Visits           4 months ago Annual physical exam   [place] [facility] at [facility], Nyoka Cowden, MD   1 year ago Annual physical exam   [place] [facility] at [facility], Nyoka Cowden, MD   2 years ago Annual physical exam   [place] [facility] at [facility], Nyoka Cowden, MD   3 years ago Hypothyroidism due to acquired atrophy of thyroid   [place] [facility] at [facility], Nyoka Cowden, MD   3 years ago Streptococcal pharyngitis   [place] [facility] at [facility], Nyoka Cowden, MD       Future Appointments             In 7 months Judithann Graves, Nyoka Cowden, MD [facility] at [facility], [facility]

## 2022-06-06 NOTE — Telephone Encounter (Signed)
Requested Prescriptions  Pending Prescriptions Disp Refills  . ALPRAZolam (XANAX) 0.5 MG tablet [Pharmacy Med Name: ALPRAZOLAM 0.5 MG TABLET] 60 tablet 0    Sig: TAKE 1 TABLET BY MOUTH TWICE A DAY AS NEEDED FOR ANXIETY     Not Delegated - Psychiatry: Anxiolytics/Hypnotics 2 Failed - 06/04/2022 11:52 AM      Failed - This refill cannot be delegated      Failed - Urine Drug Screen completed in last 360 days      Passed - Patient is not pregnant      Passed - Valid encounter within last 6 months    Recent Outpatient Visits          4 months ago Annual physical exam   [place] [facility] at [facility], Jesse Sans, MD   1 year ago Annual physical exam   [place] [facility] at [facility], Jesse Sans, MD   2 years ago Annual physical exam   [place] [facility] at [facility], Jesse Sans, MD   3 years ago Hypothyroidism due to acquired atrophy of thyroid   [place] [facility] at [facility], Jesse Sans, MD   3 years ago Streptococcal pharyngitis   [place] [facility] at [facility], Jesse Sans, MD      Future Appointments            In 7 months Army Melia, Jesse Sans, MD [facility] at [facility] [place]  . venlafaxine XR (EFFEXOR-XR) 75 MG 24 hr capsule [Pharmacy Med Name: VENLAFAXINE HCL ER 75 MG CAP] 30 capsule 2    Sig: TAKE 1 CAPSULE BY MOUTH EVERY DAY     Psychiatry: Antidepressants - SNRI - desvenlafaxine & venlafaxine Failed - 06/04/2022 11:52 AM      Failed - Last BP in normal range    BP Readings from Last 1 Encounters:  03/16/22 (!) 141/91         Failed - Lipid Panel in normal range within the last 12 months    Cholesterol, Total  Date Value Ref Range Status  01/10/2022 162 100 - 199 mg/dL Final   LDL Chol  Calc (NIH)  Date Value Ref Range Status  01/10/2022 70 0 - 99 mg/dL Final   HDL  Date Value Ref Range Status  01/10/2022 75 >39 mg/dL Final   Triglycerides  Date Value Ref Range Status  01/10/2022 96 0 - 149 mg/dL Final         Passed - Cr in normal range and within 360 days    Creatinine  Date Value Ref Range Status  11/08/2013 0.93 0.60 - 1.30 mg/dL Final   Creatinine, Ser  Date Value Ref Range Status  01/10/2022 0.79 0.57 - 1.00 mg/dL Final         Passed - Completed PHQ-2 or PHQ-9 in the last 360 days      Passed - Valid encounter within last 6 months    Recent Outpatient Visits          4 months ago Annual physical exam   [place] [facility] at [facility], Jesse Sans, MD   1 year ago Annual physical exam   [facility], [facility]   2 years ago Annual physical exam   [place] [facility] at [facility], Nyoka Cowden, MD   3 years ago Hypothyroidism due to acquired atrophy of thyroid   [place] [facility] at [facility], Nyoka Cowden, MD   3 years ago Streptococcal pharyngitis   [place] [facility] at [facility], Nyoka Cowden, MD      Future Appointments            In 7 months Judithann Graves, Nyoka Cowden, MD [facility] Primary [facility] Medicine at [facility], [facility]

## 2022-06-06 NOTE — Telephone Encounter (Signed)
Please review. Last office visit 01/10/2022.  KP

## 2022-06-15 ENCOUNTER — Encounter: Payer: Self-pay | Admitting: Pain Medicine

## 2022-06-15 ENCOUNTER — Other Ambulatory Visit: Payer: Self-pay

## 2022-06-15 ENCOUNTER — Ambulatory Visit: Payer: Commercial Managed Care - PPO | Attending: Pain Medicine | Admitting: Pain Medicine

## 2022-06-15 VITALS — BP 131/89 | HR 78 | Temp 97.2°F | Resp 16 | Ht 67.0 in | Wt 150.0 lb

## 2022-06-15 DIAGNOSIS — M25531 Pain in right wrist: Secondary | ICD-10-CM | POA: Insufficient documentation

## 2022-06-15 DIAGNOSIS — G90511 Complex regional pain syndrome I of right upper limb: Secondary | ICD-10-CM | POA: Insufficient documentation

## 2022-06-15 DIAGNOSIS — Z79899 Other long term (current) drug therapy: Secondary | ICD-10-CM | POA: Diagnosis present

## 2022-06-15 DIAGNOSIS — Z79891 Long term (current) use of opiate analgesic: Secondary | ICD-10-CM | POA: Insufficient documentation

## 2022-06-15 DIAGNOSIS — G8929 Other chronic pain: Secondary | ICD-10-CM | POA: Insufficient documentation

## 2022-06-15 DIAGNOSIS — M25551 Pain in right hip: Secondary | ICD-10-CM | POA: Diagnosis present

## 2022-06-15 DIAGNOSIS — M545 Low back pain, unspecified: Secondary | ICD-10-CM | POA: Diagnosis present

## 2022-06-15 DIAGNOSIS — G894 Chronic pain syndrome: Secondary | ICD-10-CM | POA: Insufficient documentation

## 2022-06-15 MED ORDER — HYDROCODONE-ACETAMINOPHEN 10-325 MG PO TABS: 1.0000 | ORAL_TABLET | ORAL | 0 refills | Status: DC | PRN

## 2022-06-15 NOTE — Progress Notes (Signed)
PROVIDER NOTE: Information contained herein reflects review and annotations entered in association with encounter. Interpretation of such information and data should be left to medically-trained personnel. Information provided to patient can be located elsewhere in the medical record under "Patient Instructions". Document created using STT-dictation technology, any transcriptional errors that may result from process are unintentional.    Patient: Wendy Snyder  Service Category: E/M  Provider: Gaspar Cola, MD  DOB: May 29, 1966  DOS: 06/15/2022  Referring Provider: Glean Hess, MD  MRN: [id number]  Specialty: Interventional Pain Management  PCP: Glean Hess, MD  Type: Established Patient  Setting: Ambulatory outpatient    Location: Office  Delivery: Face-to-face     HPI  [name], a 56 y.o. year old female, is here today because of her Chronic pain syndrome [G89.4]. [name] primary complain today is Back Pain (Lumbar mainly on the left ) Last encounter: My last encounter with her was on 03/28/2022. Pertinent problems: [name] has Chronic pain syndrome; Presence of functional implant (Rechargable Medtronic Neurostimulator) (Cervical Epidural Leads); Spinal cord stimulator in situ (cervical leads); Chronic upper extremity pain (Right); RSD (reflex sympathetic dystrophy) (right upper extremity); Chronic low back pain (1ry area of Pain) (Bilateral) (R>L) w/o sciatica; Chronic hip pain (2ry area of Pain) (Right); Chronic wrist pain (3ry area of Pain) (Right) (CRPS); Chronic shoulder pain (Right); and CRPS (complex regional pain syndrome), type I, upper (Right) on their pertinent problem list. Pain Assessment: Severity of Chronic pain is reported as a 2 /10. Location: Back Lower, Left/left knee is beginning to bother her, not sure if r/t back pain. Onset: More than a month ago. Quality: Discomfort, Constant, Stabbing, Aching. Timing: Constant. Modifying  factor(s): not sitting, medications. Vitals:  height is 5' 7"  (1.702 m) and weight is 150 lb (68 kg). Her temporal temperature is 97.2 F (36.2 C) (abnormal). Her blood pressure is 131/89 and her pulse is 78. Her respiration is 16 and oxygen saturation is 100%.   Reason for encounter: medication management.  The patient indicates doing well with the current medication regimen. No adverse reactions or side effects reported to the medications.   RTCB: 09/18/2022  Pharmacotherapy Assessment  Analgesic: Hydrocodone/APAP 10/325, 1 tab PO q 4 hrs (60 mg/day of hydrocodone) MME/day: 60 mg/day.   Monitoring: [place] PMP: PDMP reviewed during this encounter.       Pharmacotherapy: No side-effects or adverse reactions reported. Compliance: No problems identified. Effectiveness: Clinically acceptable.  Janett Billow, RN  06/15/2022  8:34 AM  Sign when Signing Visit Nursing Pain Medication Assessment:  Safety precautions to be maintained throughout the outpatient stay will include: orient to surroundings, keep bed in low position, maintain call bell within reach at all times, provide assistance with transfer out of bed and ambulation.  Medication Inspection Compliance: Pill count conducted under aseptic conditions, in front of the patient. Neither the pills nor the bottle was removed from the patient's sight at any time. Once count was completed pills were immediately returned to the patient in their original bottle.  Medication: Hydrocodone/APAP Pill/Patch Count:  72 of 180 pills remain Pill/Patch Appearance: Markings consistent with prescribed medication Bottle Appearance: Standard pharmacy container. Clearly labeled. Filled Date: 09 / 11 / 2023 Last Medication intake:  Today    No results found for: "CBDTHCR" No results found for: "D8THCCBX" No results found for: "D9THCCBX"  UDS:  Summary  Date Value Ref Range Status  03/16/2022 Note  Final    Comment:     ====================================================================  [employer] Select 13 (MW) ==================================================================== Test                             Result       Flag       Units  Drug Present and Declared for Prescription Verification   Hydrocodone                    1469         EXPECTED   ng/mg creat   Hydromorphone                  359          EXPECTED   ng/mg creat   Dihydrocodeine                 233          EXPECTED   ng/mg creat   Norhydrocodone                 >5376        EXPECTED   ng/mg creat    Sources of hydrocodone include scheduled prescription medications.    Hydromorphone, dihydrocodeine and norhydrocodone are expected    metabolites of hydrocodone. Hydromorphone and dihydrocodeine are    also available as scheduled prescription medications.  Drug Absent but Declared for Prescription Verification   Alprazolam                     Not Detected UNEXPECTED ng/mg creat ==================================================================== Test                      Result    Flag   Units      Ref Range   Creatinine              93               mg/dL      >=20 ==================================================================== Declared Medications:  The flagging and interpretation on this report are based on the  following declared medications.  Unexpected results may arise from  inaccuracies in the declared medications.   **Note: The testing scope of this panel includes these medications:   Alprazolam (Xanax)  Hydrocodone (Norco)   **Note: The testing scope of this panel does not include the  following reported medications:   Acetaminophen (Norco)  Acyclovir (Zovirax)  Levothyroxine (Synthroid)  Multivitamin  Naloxone (Narcan)  Naproxen (Aleve)  Triamcinolone (Kenalog)  Valacyclovir (Valtrex)  Venlafaxine (Effexor)  Vitamin D3 ==================================================================== For clinical  consultation, please call [phone]. ====================================================================       ROS  Constitutional: Denies any fever or chills Gastrointestinal: No reported hemesis, hematochezia, vomiting, or acute GI distress Musculoskeletal: Denies any acute onset joint swelling, redness, loss of ROM, or weakness Neurological: No reported episodes of acute onset apraxia, aphasia, dysarthria, agnosia, amnesia, paralysis, loss of coordination, or loss of consciousness  Medication Review  ALPRAZolam, HYDROcodone-acetaminophen, Vitamin D3, acyclovir ointment, levothyroxine, multivitamin, naloxone, naproxen sodium, triamcinolone cream, valACYclovir, and venlafaxine XR  History Review  Allergy: [name] is allergic to phenergan [promethazine hcl]. Drug: [name]  reports no history of drug use. Alcohol:  reports no history of alcohol use. Tobacco:  reports that she has never smoked. She has never used smokeless tobacco. Social: [name]  reports that she has never smoked. She has never used smokeless tobacco. She reports that she does not drink alcohol and does  not use drugs. Medical:  has a past medical history of Depression, Hypothyroidism, and Thyroid disease. Surgical: [name]  has a past surgical history that includes Spinal cord stimulator implant (2003); Tubal ligation (1990); Fracture surgery (Right); and Breast cyst excision (Left). Family: family history includes Breast cancer in her maternal grandmother; Breast cancer (age of onset: 34) in her mother; Depression in her mother. She was adopted.  Laboratory Chemistry Profile   Renal Lab Results  Component Value Date   BUN 17 01/10/2022   CREATININE 0.79 01/10/2022   BCR 22 01/10/2022   GFRAA 88 12/10/2019   GFRNONAA 76 12/10/2019    Hepatic Lab Results  Component Value Date   AST 27 01/10/2022   ALT 23 01/10/2022   ALBUMIN 4.9 01/10/2022   ALKPHOS 61 01/10/2022     Electrolytes Lab Results  Component Value Date   NA 143 01/10/2022   K 4.4 01/10/2022   CL 105 01/10/2022   CALCIUM 10.0 01/10/2022   MG 2.1 12/10/2019    Bone Lab Results  Component Value Date   25OHVITD1 84 12/10/2019   25OHVITD2 <1.0 12/10/2019   25OHVITD3 84 12/10/2019    Inflammation (CRP: Acute Phase) (ESR: Chronic Phase) Lab Results  Component Value Date   CRP <1 12/10/2019   ESRSEDRATE 2 12/10/2019         Note: Above Lab results reviewed.  Recent Imaging Review  MM 3D SCREEN BREAST BILATERAL CLINICAL DATA:  Screening.  EXAM: DIGITAL SCREENING BILATERAL MAMMOGRAM WITH TOMOSYNTHESIS AND CAD  TECHNIQUE: Bilateral screening digital craniocaudal and mediolateral oblique mammograms were obtained. Bilateral screening digital breast tomosynthesis was performed. The images were evaluated with computer-aided detection.  COMPARISON:  Previous exam(s).  ACR Breast Density Category c: The breast tissue is heterogeneously dense, which may obscure small masses.  FINDINGS: There are no findings suspicious for malignancy. The images were evaluated with computer-aided detection.  IMPRESSION: No mammographic evidence of malignancy. A result letter of this screening mammogram will be mailed directly to the patient.  RECOMMENDATION: Screening mammogram in one year. (Code:SM-B-01Y)  BI-RADS CATEGORY  1: Negative.  Electronically Signed   By: Nolon Nations M.D.   On: 01/22/2021 12:46 Note: Reviewed        Physical Exam  General appearance: Well nourished, well developed, and well hydrated. In no apparent acute distress Mental status: Alert, oriented x 3 (person, place, & time)       Respiratory: No evidence of acute respiratory distress Eyes: PERLA Vitals: BP 131/89 (BP Location: Right Arm, Patient Position: Sitting, Cuff Size: Normal)   Pulse 78   Temp (!) 97.2 F (36.2 C) (Temporal)   Resp 16   Ht 5' 7"  (1.702 m)   Wt 150 lb (68 kg)   LMP 10/13/2016  (Within Days)   SpO2 100%   BMI 23.49 kg/m  BMI: Estimated body mass index is 23.49 kg/m as calculated from the following:   Height as of this encounter: 5' 7"  (1.702 m).   Weight as of this encounter: 150 lb (68 kg). Ideal: Ideal body weight: 61.6 kg (135 lb 12.9 oz) Adjusted ideal body weight: 64.2 kg (141 lb 7.7 oz)  Assessment   Diagnosis Status  1. Chronic pain syndrome   2. Chronic low back pain (1ry area of Pain) (Bilateral) (R>L) w/o sciatica   3. Chronic hip pain (2ry area of Pain) (Right)   4. Chronic wrist pain (3ry area of Pain) (Right) (CRPS)   5. Complex regional pain syndrome type 1  of right upper extremity   6. Pharmacologic therapy   7. Chronic use of opiate for therapeutic purpose   8. Encounter for medication management   9. Encounter for chronic pain management    Controlled Controlled Controlled   Updated Problems: No problems updated.   Plan of Care  Problem-specific:  No problem-specific Assessment & Plan notes found for this encounter.  [name] has a current medication list which includes the following long-term medication(s): [START ON 06/20/2022] hydrocodone-acetaminophen, [START ON 07/20/2022] hydrocodone-acetaminophen, [START ON 08/19/2022] hydrocodone-acetaminophen, levothyroxine, naloxone, and venlafaxine xr.  Pharmacotherapy (Medications Ordered): Meds ordered this encounter  Medications   HYDROcodone-acetaminophen (NORCO) 10-325 MG tablet    Sig: Take 1 tablet by mouth every 4 (four) hours as needed for severe pain. Must last 30 days    Dispense:  180 tablet    Refill:  0    DO NOT: delete (not duplicate); no partial-fill (will deny script to complete), no refill request (F/U required). DISPENSE: 1 day early if closed on fill date. WARN: No CNS-depressants within 8 hrs of med.   HYDROcodone-acetaminophen (NORCO) 10-325 MG tablet    Sig: Take 1 tablet by mouth every 4 (four) hours as needed for severe pain. Must last 30 days     Dispense:  180 tablet    Refill:  0    DO NOT: delete (not duplicate); no partial-fill (will deny script to complete), no refill request (F/U required). DISPENSE: 1 day early if closed on fill date. WARN: No CNS-depressants within 8 hrs of med.   HYDROcodone-acetaminophen (NORCO) 10-325 MG tablet    Sig: Take 1 tablet by mouth every 4 (four) hours as needed for severe pain. Must last 30 days    Dispense:  180 tablet    Refill:  0    DO NOT: delete (not duplicate); no partial-fill (will deny script to complete), no refill request (F/U required). DISPENSE: 1 day early if closed on fill date. WARN: No CNS-depressants within 8 hrs of med.   Orders:  No orders of the defined types were placed in this encounter.  Follow-up plan:   Return in about 3 months (around 09/18/2022) for Eval-day (M,W), (F2F), (MM).     Interventional Therapies  Risk  Complexity Considerations:   Estimated body mass index is 21.93 kg/m as calculated from the following:   Height as of this encounter: 5' 7"  (1.702 m).   Weight as of this encounter: 140 lb (63.5 kg). WNL   Planned  Pending:      Under consideration:   Diagnostic bilateral lumbar facet block  Diagnostic IA right hip injection    Completed:   None since before 2020.   Therapeutic  Palliative (PRN) options:   Palliative right stellate ganglion block  Management of Cervical spinal cord stimulator      Recent Visits No visits were found meeting these conditions. Showing recent visits within past 90 days and meeting all other requirements Today's Visits Date Type Provider Dept  06/15/22 Office Visit Milinda Pointer, MD [facility]  Showing today's visits and meeting all other requirements Future Appointments No visits were found meeting these conditions. Showing future appointments within next 90 days and meeting all other requirements  I discussed the assessment and treatment plan with the patient. The patient was provided  an opportunity to ask questions and all were answered. The patient agreed with the plan and demonstrated an understanding of the instructions.  Patient advised to call back or seek  an in-person evaluation if the symptoms or condition worsens.  Duration of encounter: 30 minutes.  Total time on encounter, as per AMA guidelines included both the face-to-face and non-face-to-face time personally spent by the physician and/or other qualified health care professional(s) on the day of the encounter (includes time in activities that require the physician or other qualified health care professional and does not include time in activities normally performed by clinical staff). Physician's time may include the following activities when performed: preparing to see the patient (eg, review of tests, pre-charting review of records) obtaining and/or reviewing separately obtained history performing a medically appropriate examination and/or evaluation counseling and educating the patient/family/caregiver ordering medications, tests, or procedures referring and communicating with other health care professionals (when not separately reported) documenting clinical information in the electronic or other health record independently interpreting results (not separately reported) and communicating results to the patient/ family/caregiver care coordination (not separately reported)  Note by: Gaspar Cola, MD Date: 06/15/2022; Time: 8:39 AM

## 2022-06-15 NOTE — Patient Instructions (Signed)

## 2022-06-15 NOTE — Progress Notes (Signed)
Nursing Pain Medication Assessment:  Safety precautions to be maintained throughout the outpatient stay will include: orient to surroundings, keep bed in low position, maintain call bell within reach at all times, provide assistance with transfer out of bed and ambulation.  Medication Inspection Compliance: Pill count conducted under aseptic conditions, in front of the patient. Neither the pills nor the bottle was removed from the patient's sight at any time. Once count was completed pills were immediately returned to the patient in their original bottle.  Medication: Hydrocodone/APAP Pill/Patch Count:  72 of 180 pills remain Pill/Patch Appearance: Markings consistent with prescribed medication Bottle Appearance: Standard pharmacy container. Clearly labeled. Filled Date: 09 / 11 / 2023 Last Medication intake:  Today

## 2022-07-01 ENCOUNTER — Other Ambulatory Visit: Payer: Self-pay | Admitting: Internal Medicine

## 2022-07-01 DIAGNOSIS — R21 Rash and other nonspecific skin eruption: Secondary | ICD-10-CM

## 2022-07-01 DIAGNOSIS — F064 Anxiety disorder due to known physiological condition: Secondary | ICD-10-CM

## 2022-07-04 NOTE — Telephone Encounter (Signed)
Requested medication (s) are due for refill today: yes  Requested medication (s) are on the active medication list: yes  Last refill:  04/01/22  Future visit scheduled: 01/12/23, seen 01/10/22  Notes to clinic:  This medication can not be delegated, please assess.        Requested Prescriptions  Pending Prescriptions Disp Refills   triamcinolone cream (KENALOG) 0.5 % [Pharmacy Med Name: TRIAMCINOLONE 0.5% CREAM] 30 g 0    Sig: APPLY 1 APPLICATION. TOPICALLY 3 (THREE) TIMES DAILY TO RASH ON LEG     Not Delegated - Dermatology:  Corticosteroids Failed - 07/01/2022  6:24 PM      Failed - This refill cannot be delegated      Passed - Valid encounter within last 12 months    Recent Outpatient Visits           5 months ago Annual physical exam   [place] [facility] at [facility], Nyoka Cowden, MD   1 year ago Annual physical exam   [place] [facility] at [facility], Nyoka Cowden, MD   2 years ago Annual physical exam   [place] [facility] at [facility], Nyoka Cowden, MD   3 years ago Hypothyroidism due to acquired atrophy of thyroid   [place] [facility] at [facility], Nyoka Cowden, MD   3 years ago Streptococcal pharyngitis   [place] [facility] at [facility], Nyoka Cowden, MD       Future Appointments             In 6 months Judithann Graves Nyoka Cowden, MD [facility] Medicine at [facility], [facility]            Signed Prescriptions Disp Refills   venlafaxine XR (EFFEXOR-XR) 75 MG 24 hr capsule 90 capsule 1    Sig: TAKE 1 CAPSULE BY MOUTH EVERY DAY     Psychiatry: Antidepressants - SNRI - desvenlafaxine & venlafaxine Failed - 07/01/2022  6:24 PM      Failed - Lipid Panel in normal range within the last 12 months    Cholesterol, Total  Date Value Ref Range Status  01/10/2022 162  100 - 199 mg/dL Final   LDL Chol Calc (NIH)  Date Value Ref Range Status  01/10/2022 70 0 - 99 mg/dL Final   HDL  Date Value Ref Range Status  01/10/2022 75 >39 mg/dL Final   Triglycerides  Date Value Ref Range Status  01/10/2022 96 0 - 149 mg/dL Final         Passed - Cr in normal range and within 360 days    Creatinine  Date Value Ref Range Status  11/08/2013 0.93 0.60 - 1.30 mg/dL Final   Creatinine, Ser  Date Value Ref Range Status  01/10/2022 0.79 0.57 - 1.00 mg/dL Final         Passed - Completed PHQ-2 or PHQ-9 in the last 360 days      Passed - Last BP in normal range    BP Readings from Last 1 Encounters:  06/15/22 131/89         Passed - Valid encounter within last 6 months    Recent Outpatient Visits           5 months ago Annual physical exam   [facility] at [facility]  Glean Hess, MD   1 year ago Annual physical exam   [place] [facility] at [facility], Jesse Sans, MD   2 years ago Annual physical exam   [place] [facility] at [facility], Jesse Sans, MD   3 years ago Hypothyroidism due to acquired atrophy of thyroid   [place] [facility] Medicine at [facility], Jesse Sans, MD   3 years ago Streptococcal pharyngitis   [place] [facility] at [facility], Jesse Sans, MD       Future Appointments             In 6 months Army Melia, Jesse Sans, MD [place] [facility] at [facility], [facility]

## 2022-07-04 NOTE — Telephone Encounter (Signed)
Requested Prescriptions  Pending Prescriptions Disp Refills  . triamcinolone cream (KENALOG) 0.5 % [Pharmacy Med Name: TRIAMCINOLONE 0.5% CREAM] 30 g 0    Sig: APPLY 1 APPLICATION. TOPICALLY 3 (THREE) TIMES DAILY TO RASH ON LEG     Not Delegated - Dermatology:  Corticosteroids Failed - 07/01/2022  6:24 PM      Failed - This refill cannot be delegated      Passed - Valid encounter within last 12 months    Recent Outpatient Visits          5 months ago Annual physical exam   [place] [facility] at [facility], Nyoka Cowden, MD   1 year ago Annual physical exam   [place] [facility] at [facility], Nyoka Cowden, MD   2 years ago Annual physical exam   [place] [facility] at [facility], Nyoka Cowden, MD   3 years ago Hypothyroidism due to acquired atrophy of thyroid   [place] [facility] at [facility], Nyoka Cowden, MD   3 years ago Streptococcal pharyngitis   [place] [facility] at [facility], Nyoka Cowden, MD      Future Appointments            In 6 months Judithann Graves, Nyoka Cowden, MD [facility] at [facility], [facility]           . venlafaxine XR (EFFEXOR-XR) 75 MG 24 hr capsule [Pharmacy Med Name: VENLAFAXINE HCL ER 75 MG CAP] 90 capsule 1    Sig: TAKE 1 CAPSULE BY MOUTH EVERY DAY     Psychiatry: Antidepressants - SNRI - desvenlafaxine & venlafaxine Failed - 07/01/2022  6:24 PM      Failed - Lipid Panel in normal range within the last 12 months    Cholesterol, Total  Date Value Ref Range Status  01/10/2022 162 100 - 199 mg/dL Final   LDL Chol Calc (NIH)  Date Value Ref Range Status  01/10/2022 70 0 - 99 mg/dL Final   HDL  Date Value Ref Range Status  01/10/2022 75 >39 mg/dL Final   Triglycerides  Date Value Ref Range Status  01/10/2022 96 0 - 149 mg/dL Final          Passed - Cr in normal range and within 360 days    Creatinine  Date Value Ref Range Status  11/08/2013 0.93 0.60 - 1.30 mg/dL Final   Creatinine, Ser  Date Value Ref Range Status  01/10/2022 0.79 0.57 - 1.00 mg/dL Final         Passed - Completed PHQ-2 or PHQ-9 in the last 360 days      Passed - Last BP in normal range    BP Readings from Last 1 Encounters:  06/15/22 131/89         Passed - Valid encounter within last 6 months    Recent Outpatient Visits          5 months ago Annual physical exam   [place] [facility] at [facility], Nyoka Cowden, MD   1 year ago Annual physical exam   [place] [facility] at [facility], Nyoka Cowden, MD   2 years ago Annual physical exam   [facility] at [facility], Nyoka Cowden, MD  3 years ago Hypothyroidism due to acquired atrophy of thyroid   [place] [facility] at [facility], Jesse Sans, MD   3 years ago Streptococcal pharyngitis   [place] [facility] at [facility], Jesse Sans, MD      Future Appointments            In 6 months Army Melia, Jesse Sans, MD [place] [facility] at [facility], [facility]

## 2022-07-05 ENCOUNTER — Telehealth: Payer: Self-pay | Admitting: Pain Medicine

## 2022-07-05 NOTE — Telephone Encounter (Signed)
I asked Dr. Dossie Arbour about this, no changes will be made.  Patient notified.

## 2022-07-05 NOTE — Telephone Encounter (Signed)
Patient's pharmacy does not have meds and she is unable to find one that does. Is there something else Dr. Dossie Snyder could write for her pain ?

## 2022-07-06 ENCOUNTER — Other Ambulatory Visit: Payer: Self-pay | Admitting: *Deleted

## 2022-07-06 ENCOUNTER — Other Ambulatory Visit (HOSPITAL_COMMUNITY): Payer: Self-pay

## 2022-07-06 ENCOUNTER — Telehealth: Payer: Self-pay | Admitting: Pain Medicine

## 2022-07-06 DIAGNOSIS — G8929 Other chronic pain: Secondary | ICD-10-CM

## 2022-07-06 DIAGNOSIS — Z79899 Other long term (current) drug therapy: Secondary | ICD-10-CM

## 2022-07-06 DIAGNOSIS — G894 Chronic pain syndrome: Secondary | ICD-10-CM

## 2022-07-06 DIAGNOSIS — Z79891 Long term (current) use of opiate analgesic: Secondary | ICD-10-CM

## 2022-07-06 DIAGNOSIS — G90511 Complex regional pain syndrome I of right upper limb: Secondary | ICD-10-CM

## 2022-07-06 MED ORDER — HYDROCODONE-ACETAMINOPHEN 10-325 MG PO TABS: 1.0000 | ORAL_TABLET | ORAL | 0 refills | Status: DC | PRN

## 2022-07-06 MED ORDER — HYDROCODONE-ACETAMINOPHEN 10-325 MG PO TABS
1.0000 | ORAL_TABLET | ORAL | 0 refills | Status: DC | PRN
  Filled 2022-08-10 – 2022-08-22 (×2): qty 180, 30d supply, fill #0

## 2022-07-06 MED ORDER — HYDROCODONE-ACETAMINOPHEN 10-325 MG PO TABS
1.0000 | ORAL_TABLET | ORAL | 0 refills | Status: DC | PRN
  Filled 2022-07-06 – 2022-07-08 (×2): qty 180, 30d supply, fill #0

## 2022-07-06 NOTE — Telephone Encounter (Signed)
Medication refill request sent to [facility]  

## 2022-07-06 NOTE — Telephone Encounter (Signed)
Patient found a pharmacy to fill her hydrocodone. [facility]. She would like to get scripts transferred to that pharmacy permanently. Please call patient and advise when she can pick up.

## 2022-07-07 NOTE — Telephone Encounter (Signed)
Unavailable to leave message with patient d/t mailbox being full.

## 2022-07-07 NOTE — Telephone Encounter (Signed)
Patient notified that Rx's have been resent.

## 2022-07-08 ENCOUNTER — Other Ambulatory Visit (HOSPITAL_COMMUNITY): Payer: Self-pay

## 2022-07-11 ENCOUNTER — Other Ambulatory Visit (HOSPITAL_COMMUNITY): Payer: Self-pay

## 2022-08-10 ENCOUNTER — Other Ambulatory Visit (HOSPITAL_COMMUNITY): Payer: Self-pay

## 2022-08-10 ENCOUNTER — Other Ambulatory Visit (HOSPITAL_BASED_OUTPATIENT_CLINIC_OR_DEPARTMENT_OTHER): Payer: Self-pay

## 2022-08-22 ENCOUNTER — Other Ambulatory Visit (HOSPITAL_COMMUNITY): Payer: Self-pay

## 2022-08-23 ENCOUNTER — Other Ambulatory Visit (HOSPITAL_COMMUNITY): Payer: Self-pay

## 2022-08-24 ENCOUNTER — Other Ambulatory Visit (HOSPITAL_COMMUNITY): Payer: Self-pay

## 2022-09-04 NOTE — Progress Notes (Unsigned)
PROVIDER NOTE: Information contained herein reflects review and annotations entered in association with encounter. Interpretation of such information and data should be left to medically-trained personnel. Information provided to patient can be located elsewhere in the medical record under "Patient Instructions". Document created using STT-dictation technology, any transcriptional errors that may result from process are unintentional.    Patient: Wendy Snyder  Service Category: E/M  Provider: Gaspar Cola, MD  DOB: 06-Jul-1966  DOS: 09/07/2022  Referring Provider: Glean Hess, MD  MRN: [id number]  Specialty: Interventional Pain Management  PCP: Glean Hess, MD  Type: Established Patient  Setting: Ambulatory outpatient    Location: Office  Delivery: Face-to-face     HPI  [name], a 56 y.o. year old female, is here today because of her No primary diagnosis found.. [name] primary complain today is No chief complaint on file. Last encounter: My last encounter with her was on 07/06/2022. Pertinent problems: Wendy Snyder has Chronic pain syndrome; Presence of functional implant (Rechargable Medtronic Neurostimulator) (Cervical Epidural Leads); Spinal cord stimulator in situ (cervical leads); Chronic upper extremity pain (Right); RSD (reflex sympathetic dystrophy) (right upper extremity); Chronic low back pain (1ry area of Pain) (Bilateral) (R>L) w/o sciatica; Chronic hip pain (2ry area of Pain) (Right); Chronic wrist pain (3ry area of Pain) (Right) (CRPS); Chronic shoulder pain (Right); and CRPS (complex regional pain syndrome), type I, upper (Right) on their pertinent problem list. Pain Assessment: Severity of   is reported as a  /10. Location:    / . Onset:  . Quality:  . Timing:  . Modifying factor(s):  Marland Kitchen Vitals:  vitals were not taken for this visit.  BMI: Estimated body mass index is 23.49 kg/m as calculated from the following:   Height as of 06/15/22: 5'  7" (1.702 m).   Weight as of 06/15/22: 150 lb (68 kg).  Reason for encounter: medication management. ***  Pharmacotherapy Assessment  Analgesic: Hydrocodone/APAP 10/325, 1 tab PO q 4 hrs (60 mg/day of hydrocodone) MME/day: 60 mg/day.   Monitoring: [place] PMP: PDMP reviewed during this encounter.       Pharmacotherapy: No side-effects or adverse reactions reported. Compliance: No problems identified. Effectiveness: Clinically acceptable.  No notes on file  No results found for: "CBDTHCR" No results found for: "D8THCCBX" No results found for: "D9THCCBX"  UDS:  Summary  Date Value Ref Range Status  03/16/2022 Note  Final    Comment:    ==================================================================== [employer] Select 13 (MW) ==================================================================== Test                             Result       Flag       Units  Drug Present and Declared for Prescription Verification   Hydrocodone                    1469         EXPECTED   ng/mg creat   Hydromorphone                  359          EXPECTED   ng/mg creat   Dihydrocodeine                 233          EXPECTED   ng/mg creat   Norhydrocodone                 >  5376        EXPECTED   ng/mg creat    Sources of hydrocodone include scheduled prescription medications.    Hydromorphone, dihydrocodeine and norhydrocodone are expected    metabolites of hydrocodone. Hydromorphone and dihydrocodeine are    also available as scheduled prescription medications.  Drug Absent but Declared for Prescription Verification   Alprazolam                     Not Detected UNEXPECTED ng/mg creat ==================================================================== Test                      Result    Flag   Units      Ref Range   Creatinine              93               mg/dL      >=20 ==================================================================== Declared Medications:  The flagging and interpretation on this  report are based on the  following declared medications.  Unexpected results may arise from  inaccuracies in the declared medications.   **Note: The testing scope of this panel includes these medications:   Alprazolam (Xanax)  Hydrocodone (Norco)   **Note: The testing scope of this panel does not include the  following reported medications:   Acetaminophen (Norco)  Acyclovir (Zovirax)  Levothyroxine (Synthroid)  Multivitamin  Naloxone (Narcan)  Naproxen (Aleve)  Triamcinolone (Kenalog)  Valacyclovir (Valtrex)  Venlafaxine (Effexor)  Vitamin D3 ==================================================================== For clinical consultation, please call [phone]. ====================================================================       ROS  Constitutional: Denies any fever or chills Gastrointestinal: No reported hemesis, hematochezia, vomiting, or acute GI distress Musculoskeletal: Denies any acute onset joint swelling, redness, loss of ROM, or weakness Neurological: No reported episodes of acute onset apraxia, aphasia, dysarthria, agnosia, amnesia, paralysis, loss of coordination, or loss of consciousness  Medication Review  ALPRAZolam, HYDROcodone-acetaminophen, Vitamin D3, acyclovir ointment, levothyroxine, multivitamin, naloxone, naproxen sodium, triamcinolone cream, valACYclovir, and venlafaxine XR  History Review  Allergy: Wendy Snyder is allergic to phenergan [promethazine hcl]. Drug: Wendy Snyder  reports no history of drug use. Alcohol:  reports no history of alcohol use. Tobacco:  reports that she has never smoked. She has never used smokeless tobacco. Social: Wendy Snyder  reports that she has never smoked. She has never used smokeless tobacco. She reports that she does not drink alcohol and does not use drugs. Medical:  has a past medical history of Depression, Hypothyroidism, and Thyroid disease. Surgical: Wendy Snyder  has a past surgical history that  includes Spinal cord stimulator implant (2003); Tubal ligation (1990); Fracture surgery (Right); and Breast cyst excision (Left). Family: family history includes Breast cancer in her maternal grandmother; Breast cancer (age of onset: 37) in her mother; Depression in her mother. She was adopted.  Laboratory Chemistry Profile   Renal Lab Results  Component Value Date   BUN 17 01/10/2022   CREATININE 0.79 01/10/2022   BCR 22 01/10/2022   GFRAA 88 12/10/2019   GFRNONAA 76 12/10/2019    Hepatic Lab Results  Component Value Date   AST 27 01/10/2022   ALT 23 01/10/2022   ALBUMIN 4.9 01/10/2022   ALKPHOS 61 01/10/2022    Electrolytes Lab Results  Component Value Date   NA 143 01/10/2022   K 4.4 01/10/2022   CL 105 01/10/2022   CALCIUM 10.0 01/10/2022   MG 2.1 12/10/2019    Bone Lab Results  Component  Value Date   25OHVITD1 84 12/10/2019   25OHVITD2 <1.0 12/10/2019   25OHVITD3 84 12/10/2019    Inflammation (CRP: Acute Phase) (ESR: Chronic Phase) Lab Results  Component Value Date   CRP <1 12/10/2019   ESRSEDRATE 2 12/10/2019         Note: Above Lab results reviewed.  Recent Imaging Review  MM 3D SCREEN BREAST BILATERAL CLINICAL DATA:  Screening.  EXAM: DIGITAL SCREENING BILATERAL MAMMOGRAM WITH TOMOSYNTHESIS AND CAD  TECHNIQUE: Bilateral screening digital craniocaudal and mediolateral oblique mammograms were obtained. Bilateral screening digital breast tomosynthesis was performed. The images were evaluated with computer-aided detection.  COMPARISON:  Previous exam(s).  ACR Breast Density Category c: The breast tissue is heterogeneously dense, which may obscure small masses.  FINDINGS: There are no findings suspicious for malignancy. The images were evaluated with computer-aided detection.  IMPRESSION: No mammographic evidence of malignancy. A result letter of this screening mammogram will be mailed directly to the patient.  RECOMMENDATION: Screening  mammogram in one year. (Code:SM-B-01Y)  BI-RADS CATEGORY  1: Negative.  Electronically Signed   By: Nolon Nations M.D.   On: 01/22/2021 12:46 Note: Reviewed        Physical Exam  General appearance: Well nourished, well developed, and well hydrated. In no apparent acute distress Mental status: Alert, oriented x 3 (person, place, & time)       Respiratory: No evidence of acute respiratory distress Eyes: PERLA Vitals: LMP 10/13/2016 (Within Days)  BMI: Estimated body mass index is 23.49 kg/m as calculated from the following:   Height as of 06/15/22: _0  (1.702 m).   Weight as of 06/15/22: 150 lb (68 kg). Ideal: Patient weight not recorded  Assessment   Diagnosis Status  No diagnosis found. Controlled Controlled Controlled   Updated Problems: No problems updated.  Plan of Care  Problem-specific:  No problem-specific Assessment & Plan notes found for this encounter.  [name] has a current medication list which includes the following long-term medication(s): hydrocodone-acetaminophen, hydrocodone-acetaminophen, hydrocodone-acetaminophen, levothyroxine, naloxone, and venlafaxine xr.  Pharmacotherapy (Medications Ordered): No orders of the defined types were placed in this encounter.  Orders:  No orders of the defined types were placed in this encounter.  Follow-up plan:   No follow-ups on file.     Interventional Therapies  Risk  Complexity Considerations:   Estimated body mass index is 21.93 kg/m as calculated from the following:   Height as of this encounter: _1  (1.702 m).   Weight as of this encounter: 140 lb (63.5 kg). WNL   Planned  Pending:      Under consideration:   Diagnostic bilateral lumbar facet block  Diagnostic IA right hip injection    Completed:   None since before 2020.   Therapeutic  Palliative (PRN) options:   Palliative right stellate ganglion block  Management of Cervical spinal cord stimulator       Recent  Visits Date Type Provider Dept  06/15/22 Office Visit Milinda Pointer, MD [facility]  Showing recent visits within past 90 days and meeting all other requirements Future Appointments Date Type Provider Dept  09/07/22 Appointment Milinda Pointer, MD [facility]  Showing future appointments within next 90 days and meeting all other requirements  I discussed the assessment and treatment plan with the patient. The patient was provided an opportunity to ask questions and all were answered. The patient agreed with the plan and demonstrated an understanding of the instructions.  Patient advised to call back  or seek an in-person evaluation if the symptoms or condition worsens.  Duration of encounter: *** minutes.  Total time on encounter, as per AMA guidelines included both the face-to-face and non-face-to-face time personally spent by the physician and/or other qualified health care professional(s) on the day of the encounter (includes time in activities that require the physician or other qualified health care professional and does not include time in activities normally performed by clinical staff). Physician's time may include the following activities when performed: preparing to see the patient (eg, review of tests, pre-charting review of records) obtaining and/or reviewing separately obtained history performing a medically appropriate examination and/or evaluation counseling and educating the patient/family/caregiver ordering medications, tests, or procedures referring and communicating with other health care professionals (when not separately reported) documenting clinical information in the electronic or other health record independently interpreting results (not separately reported) and communicating results to the patient/ family/caregiver care coordination (not separately reported)  Note by: Gaspar Cola, MD Date: 09/07/2022; Time: 11:06 AM

## 2022-09-06 NOTE — Patient Instructions (Signed)
____________________________________________________________________________________________  Patient Information update  To: All of our patients.  Re: Name change.  It has been made official that our current name, "[facility]"   will soon be changed to " [facility]".   The purpose of this change is to eliminate any confusion created by the concept of our practice being a "Medication Management Pain Clinic". In the past this has led to the misconception that we treat pain primarily by the use of prescription medications.  Nothing can be farther from the truth.   Understanding PAIN MANAGEMENT: To further understand what our practice does, you first have to understand that "Pain Management" is a subspecialty that requires additional training once a physician has completed their specialty training, which can be in either Anesthesia, Neurology, Psychiatry, or Physical Medicine and Rehabilitation (PMR). Each one of these contributes to the final approach taken by each physician to the management of their patient's pain. To be a "Pain Management Specialist" you must have first completed one of the specialty trainings below.  Anesthesiologists - trained in clinical pharmacology and interventional techniques such as nerve blockade and regional as well as central neuroanatomy. They are trained to block pain before, during, and after surgical interventions.  Neurologists - trained in the diagnosis and pharmacological treatment of complex neurological conditions, such as Multiple Sclerosis, Parkinson's, spinal cord injuries, and other systemic conditions that may be associated with symptoms that may include but are not limited to pain. They tend to rely primarily on the treatment of chronic pain using prescription medications.  Psychiatrist - trained in conditions affecting the psychosocial  wellbeing of patients including but not limited to depression, anxiety, schizophrenia, personality disorders, addiction, and other substance use disorders that may be associated with chronic pain. They tend to rely primarily on the treatment of chronic pain using prescription medications.   Physical Medicine and Rehabilitation (PMR) physicians, also known as physiatrists - trained to treat a wide variety of medical conditions affecting the brain, spinal cord, nerves, bones, joints, ligaments, muscles, and tendons. Their training is primarily aimed at treating patients that have suffered injuries that have caused severe physical impairment. Their training is primarily aimed at the physical therapy and rehabilitation of those patients. They may also work alongside orthopedic surgeons or neurosurgeons using their expertise in assisting surgical patients to recover after their surgeries.  INTERVENTIONAL PAIN MANAGEMENT is sub-subspecialty of Pain Management.  Our physicians are Board-certified in Anesthesia, Pain Management, and Interventional Pain Management.  This meaning that not only have they been trained and Board-certified in their specialty of Anesthesia, and subspecialty of Pain Management, but they have also received further training in the sub-subspecialty of Interventional Pain Management, in order to become Board-certified as INTERVENTIONAL PAIN MANAGEMENT SPECIALIST.    Mission: Our goal is to use our skills in  INTERVENTIONAL PAIN MANAGEMENT as alternatives to the chronic use of prescription opioid medications for the treatment of pain. To make this more clear, we have changed our name to reflect what we do and offer. We will continue to offer medication management assessment and recommendations, but we will not be taking over any patient's medication management.  ____________________________________________________________________________________________      ____________________________________________________________________________________________  National Pain Medication Shortage  The [place] is experiencing worsening drug shortages. These have had a negative widespread effect on patient care and treatment. Not expected to improve any time soon. Predicted to last past 2029.   Drug shortage list (generic   names) Oxycodone IR Oxycodone/APAP Oxymorphone IR Hydromorphone Hydrocodone/APAP Morphine  Where is the problem?  Manufacturing and supply level.  Will this shortage affect you?  Only if you take any of the above pain medications.  How? You may be unable to fill your prescription.  Your pharmacist may offer a "partial fill" of your prescription. (Warning: Do not accept partial fills.) Prescriptions partially filled cannot be transferred to another pharmacy. Read our Medication Rules and Regulation. Depending on how much medicine you are dependent on, you may experience withdrawals when unable to get the medication.  Recommendations: Consider ending your dependence on opioid pain medications. Ask your pain specialist to assist you with the process. Consider switching to a medication currently not in shortage, such as Buprenorphine. Talk to your pain specialist about this option. Consider decreasing your pain medication requirements by managing tolerance thru "Drug Holidays". This may help minimize withdrawals, should you run out of medicine. Control your pain thru the use of non-pharmacological interventional therapies.   Your prescriber: Prescribers cannot be blamed for shortages. Medication manufacturing and supply issues cannot be fixed by the prescriber.   NOTE: The prescriber is not responsible for supplying the medication, or solving supply issues. Work with your pharmacist to solve it. The patient is responsible for the decision to take or continue taking the medication and for identifying and securing a legal supply source. By  law, supplying the medication is the job and responsibility of the pharmacy. The prescriber is responsible for the evaluation, monitoring, and prescribing of these medications.   Prescribers will NOT: Re-issue prescriptions that have been partially filled. Re-issue prescriptions already sent to a pharmacy.  Re-send prescriptions to a different pharmacy because yours did not have your medication. Ask pharmacist to order more medicine or transfer the prescription to another pharmacy. (Read below.)  New 2023 regulation: "May 20, 2022 Revised Regulation Allows DEA-Registered Pharmacies to Transfer Electronic Prescriptions at a Patient's Request DEA Headquarters Division - Public Information Office Patients now have the ability to request their electronic prescription be transferred to another pharmacy without having to go back to their practitioner to initiate the request. This revised regulation went into effect on Monday, May 16, 2022.     At a patient's request, a DEA-registered retail pharmacy can now transfer an electronic prescription for a controlled substance (schedules II-V) to another DEA-registered retail pharmacy. Prior to this change, patients would have to go through their practitioner to cancel their prescription and have it re-issued to a different pharmacy. The process was taxing and time consuming for both patients and practitioners.    The Drug Enforcement Administration (DEA) published its intent to revise the process for transferring electronic prescriptions on August 07, 2020.  The final rule was published in the federal register on April 14, 2022 and went into effect 30 days later.  Under the final rule, a prescription can only be transferred once between pharmacies, and only if allowed under existing state or other applicable law. The prescription must remain in its electronic form; may not be altered in any way; and the transfer must be communicated directly between  two licensed pharmacists. It's important to note, any authorized refills transfer with the original prescription, which means the entire prescription will be filled at the same pharmacy".  Reference: https://www.dea.gov/stories/2023/2023-05/2022-09-01/revised-regulation-allows-dea-registered-pharmacies-transfer (DEA website announcement)  https://www.govinfo.gov/content/pkg/FR-2022-04-14/pdf/2023-15847.pdf (Federal Register  Department of Justice)   Federal Register / Vol. 88, No. 143 / Thursday, April 14, 2022 / Rules and Regulations DEPARTMENT OF JUSTICE  Drug Enforcement   Administration  21 CFR Part 1306  [Docket No. [id number]]  RIN 1117-AB64 Transfer of Electronic Prescriptions for Schedules II-V Controlled Substances Between Pharmacies for Initial Filling  ____________________________________________________________________________________________     ____________________________________________________________________________________________  Drug Holidays  What is a "Drug Holiday"? Drug Holiday: is the name given to the process of slowly tapering down and temporarily stopping the pain medication for the purpose of decreasing or eliminating tolerance to the drug.  Benefits Improved effectiveness Decreased required effective dose Improved pain control End dependence on high dose therapy Decrease cost of therapy Uncovering "opioid-induced hyperalgesia". (OIH)  What is "opioid hyperalgesia"? It is a paradoxical increase in pain caused by exposure to opioids. Stopping the opioid pain medication, contrary to the expected, it actually decreases or completely eliminates the pain. Ref.: "A comprehensive review of opioid-induced hyperalgesia". Marion Lee, et.al. Pain Physician. 2011 Mar-Apr;14(2):145-61.  What is tolerance? Tolerance: the progressive loss of effectiveness of a pain medicine due to repetitive use. A common problem of opioid pain medications.  How long should a "Drug  Holiday" last? Effectiveness depends on the patient staying off all opioid pain medicines for a minimum of 14 consecutive days. (2 weeks)  How about just taking less of the medicine? Does not work. Will not accomplish goal of eliminating the excess receptors.  How about switching to a different pain medicine? (AKA. "Opioid rotation") Does not work. Creates the illusion of effectiveness by taking advantage of inaccurate equivalent dose calculations between different opioids. -This "technique" was promoted by studies funded by pharmaceutical companies, such as [employer], creators of "OxyContin".  Can I stop the medicine "cold turkey"? Depends. You should always coordinate with your Pain Specialist to make the transition as smoothly as possible. Avoid stopping the medicine abruptly without consulting. We recommend a "slow taper".  What is a slow taper? Taper: refers to the gradual decrease in dose.   How do I stop/taper the dose? Slowly. Decrease the daily amount of pills that you take by one (1) pill every seven (7) days. This is called a "slow downward taper". Example: if you normally take four (4) pills per day, drop it to three (3) pills per day for seven (7) days, then to two (2) pills per day for seven (7) days, then to one (1) per day for seven (7) days, and then stop the medicine. The 14 day "Drug Holiday" starts on the first day without medicine.   Will I experience withdrawals? Unlikely with a slow taper.  What triggers withdrawals? Withdrawals are triggered by the sudden/abrupt stop of high dose opioids. Withdrawals can be avoided by slowly decreasing the dose over a prolonged period of time.  What are withdrawals? Symptoms associated with sudden/abrupt reduction/stopping of high-dose, long-term use of pain medication. Withdrawal are seldom seen on low dose therapy, or patients rarely taking opioid medication.  Early Withdrawal Symptoms may include: Agitation Anxiety Muscle  aches Increased tearing Insomnia Runny nose Sweating Yawning  Late symptoms may include: Abdominal cramping Diarrhea Dilated pupils Goose bumps Nausea Vomiting  (Last update: 08/28/2022) ____________________________________________________________________________________________    _______________________________________________________________________  Medication Rules  Purpose: To inform patients, and their family members, of our medication rules and regulations.  Applies to: All patients receiving prescriptions from our practice (written or electronic).  Pharmacy of record: This is the pharmacy where your electronic prescriptions will be sent. Make sure we have the correct one.  Electronic prescriptions: In compliance with the [place] Strengthen Opioid Misuse Prevention (STOP) Act of 2017 (Session Law 2017-74/H243), effective September 19, 2018, all controlled substances must be electronically prescribed. Written prescriptions, faxing, or calling prescriptions to a pharmacy will no longer be done.  Prescription refills: These will be provided only during in-person appointments. No medications will be renewed without a "face-to-face" evaluation with your provider. Applies to all prescriptions.  NOTE: The following applies primarily to controlled substances (Opioid* Pain Medications).   Type of encounter (visit): For patients receiving controlled substances, face-to-face visits are required. (Not an option and not up to the patient.)  Patient's responsibilities: Pain Pills: Bring all pain pills to every appointment (except for procedure appointments). Pill Bottles: Bring pills in original pharmacy bottle. Bring bottle, even if empty. Always bring the bottle of the most recent fill.  Medication refills: You are responsible for knowing and keeping track of what medications you are taking and when is it that you will need a refill. The day before your appointment: write a  list of all prescriptions that need to be refilled. The day of the appointment: give the list to the admitting nurse. Prescriptions will be written only during appointments. No prescriptions will be written on procedure days. If you forget a medication: it will not be "Called in", "Faxed", or "electronically sent". You will need to get another appointment to get these prescribed. No early refills. Do not call asking to have your prescription filled early. Partial  or short prescriptions: Occasionally your pharmacy may not have enough pills to fill your prescription.  NEVER ACCEPT a partial fill or a prescription that is short of the total amount of pills that you were prescribed.  With controlled substances the law allows 72 hours for the pharmacy to complete the prescription.  If the prescription is not completed within 72 hours, the pharmacist will require a new prescription to be written. This means that you will be short on your medicine and we WILL NOT send another prescription to complete your original prescription.  Instead, request the pharmacy to send a carrier to a nearby branch to get enough medication to provide you with your full prescription. Prescription Accuracy: You are responsible for carefully inspecting your prescriptions before leaving our office. Have the discharge nurse carefully go over each prescription with you, before taking them home. Make sure that your name is accurately spelled, that your address is correct. Check the name and dose of your medication to make sure it is accurate. Check the number of pills, and the written instructions to make sure they are clear and accurate. Make sure that you are given enough medication to last until your next medication refill appointment. Taking Medication: Take medication as prescribed. When it comes to controlled substances, taking less pills or less frequently than prescribed is permitted and encouraged. Never take more pills than  instructed. Never take the medication more frequently than prescribed.  Inform other Doctors: Always inform, all of your healthcare providers, of all the medications you take. Pain Medication from other Providers: You are not allowed to accept any additional pain medication from any other Doctor or Healthcare provider. There are two exceptions to this rule. (see below) In the event that you require additional pain medication, you are responsible for notifying us, as stated below. Cough Medicine: Often these contain an opioid, such as codeine or hydrocodone. Never accept or take cough medicine containing these opioids if you are already taking an opioid* medication. The combination may cause respiratory failure and death. Medication Agreement: You are responsible for carefully reading and following our Medication Agreement. This   must be signed before receiving any prescriptions from our practice. Safely store a copy of your signed Agreement. Violations to the Agreement will result in no further prescriptions. (Additional copies of our Medication Agreement are available upon request.) Laws, Rules, & Regulations: All patients are expected to follow all Federal and State Laws, Statutes, Rules, & Regulations. Ignorance of the Laws does not constitute a valid excuse.  Illegal drugs and Controlled Substances: The use of illegal substances (including, but not limited to marijuana and its derivatives) and/or the illegal use of any controlled substances is strictly prohibited. Violation of this rule may result in the immediate and permanent discontinuation of any and all prescriptions being written by our practice. The use of any illegal substances is prohibited. Adopted CDC guidelines & recommendations: Target dosing levels will be at or below 60 MME/day. Use of benzodiazepines** is not recommended.  Exceptions: There are only two exceptions to the rule of not receiving pain medications from other Healthcare  Providers. Exception #1 (Emergencies): In the event of an emergency (i.e.: accident requiring emergency care), you are allowed to receive additional pain medication. However, you are responsible for: As soon as you are able, call our office [phone], at any time of the day or night, and leave a message stating your name, the date and nature of the emergency, and the name and dose of the medication prescribed. In the event that your call is answered by a member of our staff, make sure to document and save the date, time, and the name of the person that took your information.  Exception #2 (Planned Surgery): In the event that you are scheduled by another doctor or dentist to have any type of surgery or procedure, you are allowed (for a period no longer than 30 days), to receive additional pain medication, for the acute post-op pain. However, in this case, you are responsible for picking up a copy of our "Post-op Pain Management for Surgeons" handout, and giving it to your surgeon or dentist. This document is available at our office, and does not require an appointment to obtain it. Simply go to our office during business hours (Monday-Thursday from 8:00 AM to 4:00 PM) (Friday 8:00 AM to 12:00 Noon) or if you have a scheduled appointment with us, prior to your surgery, and ask for it by name. In addition, you are responsible for: calling our office [phone], at any time of the day or night, and leaving a message stating your name, name of your surgeon, type of surgery, and date of procedure or surgery. Failure to comply with your responsibilities may result in termination of therapy involving the controlled substances. Medication Agreement Violation. Following the above rules, including your responsibilities will help you in avoiding a Medication Agreement Violation ("Breaking your Pain Medication Contract").  Consequences:  Not following the above rules may result in permanent discontinuation of  medication prescription therapy.  *Opioid medications include: morphine, codeine, oxycodone, oxymorphone, hydrocodone, hydromorphone, meperidine, tramadol, tapentadol, buprenorphine, fentanyl, methadone. **Benzodiazepine medications include: diazepam (Valium), alprazolam (Xanax), clonazepam (Klonopine), lorazepam (Ativan), clorazepate (Tranxene), chlordiazepoxide (Librium), estazolam (Prosom), oxazepam (Serax), temazepam (Restoril), triazolam (Halcion) (Last updated: 07/12/2022) ______________________________________________________________________    ______________________________________________________________________  Medication Recommendations and Reminders  Applies to: All patients receiving prescriptions (written and/or electronic).  Medication Rules & Regulations: You are responsible for reading, knowing, and following our "Medication Rules" document. These exist for your safety and that of others. They are not flexible and neither are we. Dismissing or ignoring them is an   act of "non-compliance" that may result in complete and irreversible termination of such medication therapy. For safety reasons, "non-compliance" will not be tolerated. As with the [place] fundamental legal principle of "ignorance of the law is no defense", we will accept no excuses for not having read and knowing the content of documents provided to you by our practice.  Pharmacy of record:  Definition: This is the pharmacy where your electronic prescriptions will be sent.  We do not endorse any particular pharmacy. It is up to you and your insurance to decide what pharmacy to use.  We do not restrict you in your choice of pharmacy. However, once we write for your prescriptions, we will NOT be re-sending more prescriptions to fix restricted supply problems created by your pharmacy, or your insurance.  The pharmacy listed in the electronic medical record should be the one where you want electronic prescriptions to be  sent. If you choose to change pharmacy, simply notify our nursing staff. Changes will be made only during your regular appointments and not over the phone.  Recommendations: Keep all of your pain medications in a safe place, under lock and key, even if you live alone. We will NOT replace lost, stolen, or damaged medication. We do not accept "Police Reports" as proof of medications having been stolen. After you fill your prescription, take 1 week's worth of pills and put them away in a safe place. You should keep a separate, properly labeled bottle for this purpose. The remainder should be kept in the original bottle. Use this as your primary supply, until it runs out. Once it's gone, then you know that you have 1 week's worth of medicine, and it is time to come in for a prescription refill. If you do this correctly, it is unlikely that you will ever run out of medicine. To make sure that the above recommendation works, it is very important that you make sure your medication refill appointments are scheduled at least 1 week before you run out of medicine. To do this in an effective manner, make sure that you do not leave the office without scheduling your next medication management appointment. Always ask the nursing staff to show you in your prescription , when your medication will be running out. Then arrange for the receptionist to get you a return appointment, at least 7 days before you run out of medicine. Do not wait until you have 1 or 2 pills left, to come in. This is very poor planning and does not take into consideration that we may need to cancel appointments due to bad weather, sickness, or emergencies affecting our staff. DO NOT ACCEPT A "Partial Fill": If for any reason your pharmacy does not have enough pills/tablets to completely fill or refill your prescription, do not allow for a "partial fill". The law allows the pharmacy to complete that prescription within 72 hours, without requiring a new  prescription. If they do not fill the rest of your prescription within those 72 hours, you will need a separate prescription to fill the remaining amount, which we will NOT provide. If the reason for the partial fill is your insurance, you will need to talk to the pharmacist about payment alternatives for the remaining tablets, but again, DO NOT ACCEPT A PARTIAL FILL, unless you can trust your pharmacist to obtain the remainder of the pills within 72 hours.  Prescription refills and/or changes in medication(s):  Prescription refills, and/or changes in dose or medication, will be conducted only   during scheduled medication management appointments. (Applies to both, written and electronic prescriptions.) No refills on procedure days. No medication will be changed or started on procedure days. No changes, adjustments, and/or refills will be conducted on a procedure day. Doing so will interfere with the diagnostic portion of the procedure. No phone refills. No medications will be "called into the pharmacy". No Fax refills. No weekend refills. No Holliday refills. No after hours refills.  Remember:  Business hours are:  Monday to Thursday 8:00 AM to 4:00 PM Provider's Schedule: Hermenegildo Clausen, MD - Appointments are:  Medication management: Monday and Wednesday 8:00 AM to 4:00 PM Procedure day: Tuesday and Thursday 7:30 AM to 4:00 PM Bilal Lateef, MD - Appointments are:  Medication management: Tuesday and Thursday 8:00 AM to 4:00 PM Procedure day: Monday and Wednesday 7:30 AM to 4:00 PM (Last update: 07/12/2022) ______________________________________________________________________    ____________________________________________________________________________________________  WARNING: CBD (cannabidiol) & Delta (Delta-8 tetrahydrocannabinol) products.   Applicable to:  All individuals currently taking or considering taking CBD (cannabidiol) and, more important, all patients taking opioid  analgesic controlled substances (pain medication). (Example: oxycodone; oxymorphone; hydrocodone; hydromorphone; morphine; methadone; tramadol; tapentadol; fentanyl; buprenorphine; butorphanol; dextromethorphan; meperidine; codeine; etc.)  Introduction:  Recently there has been a drive towards the use of "natural" products for the treatment of different conditions, including pain anxiety and sleep disorders. Marijuana and hemp are two varieties of the cannabis genus plants. Marijuana and its derivatives are illegal, while hemp and its derivatives are not. Cannabidiol (CBD) and tetrahydrocannabinol (THC), are two natural compounds found in plants of the Cannabis genus. They can both be extracted from hemp or marijuana. Both compounds interact with your body's endocannabinoid system in very different ways. CBD is associated with pain relief (analgesia) while THC is associated with the psychoactive effects ("the high") obtained from the use of marijuana products. There are two main types of THC: Delta-9, which comes from the marijuana plant and it is illegal, and Delta-8, which comes from the hemp plant, and it is legal. (Both, Delta-9-THC and Delta-8-THC are psychoactive and give you "the high".)   Legality:  Marijuana and its derivatives: illegal Hemp and its derivatives: Legal (State dependent) UPDATE: (11/05/2021) The Drug Enforcement Agency (DEA) issued a letter stating that "delta" cannabinoids, including Delta-8-THCO and Delta-9-THCO, synthetically derived from hemp do not qualify as hemp and will be viewed as Schedule I drugs. (Schedule I drugs, substances, or chemicals are defined as drugs with no currently accepted medical use and a high potential for abuse. Some examples of Schedule I drugs are: heroin, lysergic acid diethylamide (LSD), marijuana (cannabis), 3,4-methylenedioxymethamphetamine (ecstasy), methaqualone, and peyote.) (https://www.dea.gov)  Legal status of CBD in [place]:  "Conditionally  Legal"  Reference: "FDA Regulation of Cannabis and Cannabis-Derived Products, Including Cannabidiol (CBD)" - https://www.fda.gov/news-events/public-health-focus/fda-regulation-cannabis-and-cannabis-derived-products-including-cannabidiol-cbd  Warning:  CBD is not FDA approved and has not undergo the same manufacturing controls as prescription drugs.  This means that the purity and safety of available CBD may be questionable. Most of the time, despite manufacturer's claims, it is contaminated with THC (delta-9-tetrahydrocannabinol - the chemical in marijuana responsible for the "HIGH").  When this is the case, the THC contaminant will trigger a positive urine drug screen (UDS) test for Marijuana (carboxy-THC).   The FDA recently put out a warning about 5 things that everyone should be aware of regarding Delta-8 THC: Delta-8 THC products have not been evaluated or approved by the FDA for safe use and may be marketed in ways that put the public health at   risk. The FDA has received adverse event reports involving delta-8 THC-containing products. Delta-8 THC has psychoactive and intoxicating effects. Delta-8 THC manufacturing often involve use of potentially harmful chemicals to create the concentrations of delta-8 THC claimed in the marketplace. The final delta-8 THC product may have potentially harmful by-products (contaminants) due to the chemicals used in the process. Manufacturing of delta-8 THC products may occur in uncontrolled or unsanitary settings, which may lead to the presence of unsafe contaminants or other potentially harmful substances. Delta-8 THC products should be kept out of the reach of children and pets.  NOTE: Because a positive UDS for any illicit substance is a violation of our medication agreement, your opioid analgesics (pain medicine) may be permanently discontinued.  MORE ABOUT CBD  General Information: CBD was discovered in 1940 and it is a derivative of the cannabis sativa  genus plants (Marijuana and Hemp). It is one of the 113 identified substances found in Marijuana. It accounts for up to 40% of the plant's extract. As of 2018, preliminary clinical studies on CBD included research for the treatment of anxiety, movement disorders, and pain. CBD is available and consumed in multiple forms, including inhalation of smoke or vapor, as an aerosol spray, and by mouth. It may be supplied as an oil containing CBD, capsules, dried cannabis, or as a liquid solution. CBD is thought not to be as psychoactive as THC (delta-9-tetrahydrocannabinol - the chemical in marijuana responsible for the "HIGH"). Studies suggest that CBD may interact with different biological target receptors in the body, including cannabinoid and other neurotransmitter receptors. As of 2018 the mechanism of action for its biological effects has not been determined.  Side-effects  Adverse reactions: Dry mouth, diarrhea, decreased appetite, fatigue, drowsiness, malaise, weakness, sleep disturbances, and others.  Drug interactions:  CBD may interact with medications such as blood-thinners. CBD causes drowsiness on its own and it will increase drowsiness caused by other medications, including antihistamines (such as Benadryl), benzodiazepines (Xanax, Ativan, Valium), antipsychotics, antidepressants, opioids, alcohol and supplements such as kava, melatonin and St. John's Wort.  Other drug interactions: Brivaracetam (Briviact); Caffeine; Carbamazepine (Tegretol); Citalopram (Celexa); Clobazam (Onfi); Eslicarbazepine (Aptiom); Everolimus (Zostress); Lithium; Methadone (Dolophine); Rufinamide (Banzel); Sedative medications (CNS depressants); Sirolimus (Rapamune); Stiripentol (Diacomit); Tacrolimus (Prograf); Tamoxifen ; Soltamox); Topiramate (Topamax); Valproate; Warfarin (Coumadin); Zonisamide. (Last update: 08/29/2022) ____________________________________________________________________________________________    ____________________________________________________________________________________________  Naloxone Nasal Spray  Why am I receiving this medication? [place] STOP ACT requires that all patients taking high dose opioids or at risk of opioids respiratory depression, be prescribed an opioid reversal agent, such as Naloxone (AKA: Narcan).  What is this medication? NALOXONE (nal OX one) treats opioid overdose, which causes slow or shallow breathing, severe drowsiness, or trouble staying awake. Call emergency services after using this medication. You may need additional treatment. Naloxone works by reversing the effects of opioids. It belongs to a group of medications called opioid blockers.  COMMON BRAND NAME(S): Kloxxado, Narcan  What should I tell my care team before I take this medication? They need to know if you have any of these conditions: Heart disease Substance use disorder An unusual or allergic reaction to naloxone, other medications, foods, dyes, or preservatives Pregnant or trying to get pregnant Breast-feeding  When to use this medication? This medication is to be used for the treatment of respiratory depression (less than 8 breaths per minute) secondary to opioid overdose.   How to use this medication? This medication is for use in the nose. Lay the person on their   back. Support their neck with your hand and allow the head to tilt back before giving the medication. The nasal spray should be given into 1 nostril. After giving the medication, move the person onto their side. Do not remove or test the nasal spray until ready to use. Get emergency medical help right away after giving the first dose of this medication, even if the person wakes up. You should be familiar with how to recognize the signs and symptoms of a narcotic overdose. If more doses are needed, give the additional dose in the other nostril. Talk to your care team about the use of this medication in children.  While this medication may be prescribed for children as young as newborns for selected conditions, precautions do apply.  Naloxone Overdosage: If you think you have taken too much of this medicine contact a poison control center or emergency room at once.  NOTE: This medicine is only for you. Do not share this medicine with others.  What if I miss a dose? This does not apply.  What may interact with this medication? This is only used during an emergency. No interactions are expected during emergency use. This list may not describe all possible interactions. Give your health care provider a list of all the medicines, herbs, non-prescription drugs, or dietary supplements you use. Also tell them if you smoke, drink alcohol, or use illegal drugs. Some items may interact with your medicine.  What should I watch for while using this medication? Keep this medication ready for use in the case of an opioid overdose. Make sure that you have the phone number of your care team and local hospital ready. You may need to have additional doses of this medication. Each nasal spray contains a single dose. Some emergencies may require additional doses. After use, bring the treated person to the nearest hospital or call 911. Make sure the treating care team knows that the person has received a dose of this medication. You will receive additional instructions on what to do during and after use of this medication before an emergency occurs.  What side effects may I notice from receiving this medication? Side effects that you should report to your care team as soon as possible: Allergic reactions--skin rash, itching, hives, swelling of the face, lips, tongue, or throat Side effects that usually do not require medical attention (report these to your care team if they continue or are bothersome): Constipation Dryness or irritation inside the nose Headache Increase in blood pressure Muscle spasms Stuffy  nose Toothache This list may not describe all possible side effects. Call your doctor for medical advice about side effects. You may report side effects to FDA at 1-800-FDA-1088.  Where should I keep my medication? Because this is an emergency medication, you should keep it with you at all times.  Keep out of the reach of children and pets. Store between 20 and 25 degrees C (68 and 77 degrees F). Do not freeze. Throw away any unused medication after the expiration date. Keep in original box until ready to use.  NOTE: This sheet is a summary. It may not cover all possible information. If you have questions about this medicine, talk to your doctor, pharmacist, or health care provider.   2023 Elsevier/Gold Standard (2021-05-14 00:00:00)  ____________________________________________________________________________________________   

## 2022-09-07 ENCOUNTER — Ambulatory Visit: Payer: Commercial Managed Care - PPO | Attending: Pain Medicine | Admitting: Pain Medicine

## 2022-09-07 ENCOUNTER — Encounter: Payer: Self-pay | Admitting: Pain Medicine

## 2022-09-07 ENCOUNTER — Other Ambulatory Visit: Payer: Self-pay | Admitting: Pain Medicine

## 2022-09-07 VITALS — BP 142/95 | HR 83 | Temp 97.1°F | Resp 16 | Ht 67.0 in | Wt 150.0 lb

## 2022-09-07 DIAGNOSIS — G894 Chronic pain syndrome: Secondary | ICD-10-CM | POA: Diagnosis present

## 2022-09-07 DIAGNOSIS — G8929 Other chronic pain: Secondary | ICD-10-CM | POA: Insufficient documentation

## 2022-09-07 DIAGNOSIS — G90511 Complex regional pain syndrome I of right upper limb: Secondary | ICD-10-CM | POA: Diagnosis present

## 2022-09-07 DIAGNOSIS — Z79899 Other long term (current) drug therapy: Secondary | ICD-10-CM

## 2022-09-07 DIAGNOSIS — M25551 Pain in right hip: Secondary | ICD-10-CM | POA: Insufficient documentation

## 2022-09-07 DIAGNOSIS — M25531 Pain in right wrist: Secondary | ICD-10-CM | POA: Diagnosis present

## 2022-09-07 DIAGNOSIS — Z79891 Long term (current) use of opiate analgesic: Secondary | ICD-10-CM | POA: Insufficient documentation

## 2022-09-07 DIAGNOSIS — M545 Low back pain, unspecified: Secondary | ICD-10-CM | POA: Diagnosis present

## 2022-09-07 MED ORDER — HYDROCODONE-ACETAMINOPHEN 10-325 MG PO TABS: 1.0000 | ORAL_TABLET | ORAL | 0 refills | Status: DC | PRN

## 2022-09-07 MED ORDER — NALOXONE HCL 4 MG/0.1ML NA LIQD: 1.0000 | NASAL | 0 refills | Status: DC | PRN

## 2022-09-14 ENCOUNTER — Telehealth: Payer: Commercial Managed Care - PPO | Admitting: Physician Assistant

## 2022-09-14 ENCOUNTER — Encounter: Payer: Commercial Managed Care - PPO | Admitting: Pain Medicine

## 2022-09-14 DIAGNOSIS — J019 Acute sinusitis, unspecified: Secondary | ICD-10-CM

## 2022-09-14 DIAGNOSIS — B9689 Other specified bacterial agents as the cause of diseases classified elsewhere: Secondary | ICD-10-CM

## 2022-09-14 MED ORDER — AMOXICILLIN-POT CLAVULANATE 875-125 MG PO TABS: 1.0000 | ORAL_TABLET | Freq: Two times a day (BID) | ORAL | 0 refills | Status: DC

## 2022-09-14 NOTE — Progress Notes (Signed)

## 2022-11-16 ENCOUNTER — Ambulatory Visit
Admission: RE | Admit: 2022-11-16 | Discharge: 2022-11-16 | Disposition: A | Discharge status: Home / Self Care | Payer: Commercial Managed Care - PPO | Source: Ambulatory Visit | Attending: Internal Medicine | Admitting: Internal Medicine

## 2022-11-16 ENCOUNTER — Encounter: Payer: Self-pay | Admitting: Internal Medicine

## 2022-11-16 ENCOUNTER — Ambulatory Visit
Admission: RE | Admit: 2022-11-16 | Discharge: 2022-11-16 | Disposition: A | Discharge status: Home / Self Care | Payer: Commercial Managed Care - PPO | Attending: Internal Medicine | Admitting: Internal Medicine

## 2022-11-16 ENCOUNTER — Ambulatory Visit: Payer: Commercial Managed Care - PPO | Admitting: Internal Medicine

## 2022-11-16 VITALS — BP 122/90 | HR 74 | Ht 67.0 in | Wt 160.0 lb

## 2022-11-16 DIAGNOSIS — M25562 Pain in left knee: Secondary | ICD-10-CM | POA: Insufficient documentation

## 2022-11-16 DIAGNOSIS — G8929 Other chronic pain: Secondary | ICD-10-CM | POA: Insufficient documentation

## 2022-11-16 MED ORDER — PREDNISONE 10 MG PO TABS: ORAL_TABLET | ORAL | 0 refills | Status: AC

## 2022-11-16 NOTE — Assessment & Plan Note (Signed)
Initial injury about 5 years ago Symptoms resolved but flared up again recently She is on Norco for back pain, taking Advil and using topicals Will try steroid taper and get imaging to determine next steps
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
## 2022-11-18 ENCOUNTER — Encounter: Payer: Self-pay | Admitting: Internal Medicine

## 2022-11-29 ENCOUNTER — Ambulatory Visit: Payer: Commercial Managed Care - PPO | Admitting: Family Medicine

## 2022-11-29 ENCOUNTER — Encounter: Payer: Self-pay | Admitting: Family Medicine

## 2022-11-29 VITALS — BP 120/90 | HR 86 | Ht 67.0 in | Wt 159.0 lb

## 2022-11-29 DIAGNOSIS — M1712 Unilateral primary osteoarthritis, left knee: Secondary | ICD-10-CM | POA: Insufficient documentation

## 2022-11-29 NOTE — Assessment & Plan Note (Signed)
Acute on chronic condition given history of symptoms previously addressed decades prior with arthroscopic debridement unspecified, episode 5 years prior of symptom flare following injury, most recently over the past 3 months, atraumatic in onset.  Localized diffusely, aggravated with up and down stairs, particularly while holding heavier items.  Examination without effusion, full range of motion with pain at terminal flexion 135 degrees, tenderness at the lateral patellar facet, somewhat medially, additionally at the patellar tendon origin, nontender at the joint lines, no ligamentous laxity with valgus/varus stressing, anterior/posterior drawer negative, negative medial and lateral McMurray's  Clinical history and findings are most consistent with patellofemoral osteoarthritis related arthralgia.  Plan for scheduled meloxicam x 2-4 weeks, home-based rehab, follow-up in 4 weeks.  Suboptimal progress to be addressed with consideration of advanced imaging, formal physical therapy, intra-articular corticosteroid injection.

## 2022-11-29 NOTE — Patient Instructions (Signed)
-   Start meloxicam daily with food x 2 weeks - After 2 weeks can take daily as-needed - Start home exercises - Return in 4 weeks - Contact us sooner for any questions / concerns

## 2022-11-29 NOTE — Progress Notes (Signed)
     Primary Care / Sports Medicine Office Visit  Patient Information:  Patient ID: Wendy Snyder, female DOB: 08/15/1966 Age: 57 y.o. MRN: [id number]   Wendy Snyder is a pleasant 57 y.o. female presenting with the following:  Chief Complaint  Patient presents with   Knee Pain    Left knee 3 months, uses copper sleeve with relief. Has had surgery on knee 15-20years ago    Vitals:   11/29/22 1400  BP: (!) 120/90  Pulse: 86  SpO2: 97%   Vitals:   11/29/22 1400  Weight: 159 lb (72.1 kg)  Height: 5\' 7"  (1.702 m)   Body mass index is 24.9 kg/m.     Independent interpretation of notes and tests performed by another provider:   Independent interpretation of left knee x-rays dated 11/17/2022 demonstrate mild-moderate medial tibiofemoral and mild patellofemoral degenerative changes with osteophyte formation at the superior and lateral aspects of the patella, no acute osseous processes identified  Procedures performed:   None  Pertinent History, Exam, Impression, and Recommendations:   Wendy Snyder was seen today for knee pain.  Primary osteoarthritis of left knee Assessment & Plan: Acute on chronic condition given history of symptoms previously addressed decades prior with arthroscopic debridement unspecified, episode 5 years prior of symptom flare following injury, most recently over the past 3 months, atraumatic in onset.  Localized diffusely, aggravated with up and down stairs, particularly while holding heavier items.  Examination without effusion, full range of motion with pain at terminal flexion 135 degrees, tenderness at the lateral patellar facet, somewhat medially, additionally at the patellar tendon origin, nontender at the joint lines, no ligamentous laxity with valgus/varus stressing, anterior/posterior drawer negative, negative medial and lateral McMurray's  Clinical history and findings are most consistent with patellofemoral osteoarthritis related  arthralgia.  Plan for scheduled meloxicam x 2-4 weeks, home-based rehab, follow-up in 4 weeks.  Suboptimal progress to be addressed with consideration of advanced imaging, formal physical therapy, intra-articular corticosteroid injection.      Orders & Medications No orders of the defined types were placed in this encounter.  No orders of the defined types were placed in this encounter.    Return in about 4 weeks (around 12/27/2022) for f/u left knee.     Montel Culver, MD, [facility]   Primary Care Sports Medicine Primary Care and Sports Medicine at [facility]

## 2022-11-30 ENCOUNTER — Encounter: Payer: Self-pay | Admitting: Family Medicine

## 2022-12-14 ENCOUNTER — Encounter: Payer: Self-pay | Admitting: Pain Medicine

## 2022-12-14 ENCOUNTER — Ambulatory Visit: Payer: Commercial Managed Care - PPO | Attending: Pain Medicine | Admitting: Pain Medicine

## 2022-12-14 VITALS — BP 140/87 | HR 67 | Temp 97.3°F | Resp 16 | Ht 67.0 in | Wt 160.0 lb

## 2022-12-14 DIAGNOSIS — Z79891 Long term (current) use of opiate analgesic: Secondary | ICD-10-CM

## 2022-12-14 DIAGNOSIS — G8929 Other chronic pain: Secondary | ICD-10-CM

## 2022-12-14 DIAGNOSIS — M25551 Pain in right hip: Secondary | ICD-10-CM | POA: Insufficient documentation

## 2022-12-14 DIAGNOSIS — Z79899 Other long term (current) drug therapy: Secondary | ICD-10-CM

## 2022-12-14 DIAGNOSIS — M25531 Pain in right wrist: Secondary | ICD-10-CM | POA: Diagnosis not present

## 2022-12-14 DIAGNOSIS — M545 Low back pain, unspecified: Secondary | ICD-10-CM | POA: Insufficient documentation

## 2022-12-14 DIAGNOSIS — G90511 Complex regional pain syndrome I of right upper limb: Secondary | ICD-10-CM | POA: Diagnosis present

## 2022-12-14 DIAGNOSIS — G894 Chronic pain syndrome: Secondary | ICD-10-CM | POA: Diagnosis not present

## 2022-12-14 MED ORDER — HYDROCODONE-ACETAMINOPHEN 10-325 MG PO TABS: 1.0000 | ORAL_TABLET | ORAL | 0 refills | Status: DC | PRN

## 2022-12-14 NOTE — Progress Notes (Signed)
PROVIDER NOTE: Information contained herein reflects review and annotations entered in association with encounter. Interpretation of such information and data should be left to medically-trained personnel. Information provided to patient can be located elsewhere in the medical record under "Patient Instructions". Document created using STT-dictation technology, any transcriptional errors that may result from process are unintentional.    Patient: Wendy Snyder  Service Category: E/M  Provider: Gaspar Cola, MD  DOB: 07-26-1966  DOS: 12/14/2022  Referring Provider: Glean Hess, MD  MRN: [id number]  Specialty: Interventional Pain Management  PCP: Glean Hess, MD  Type: Established Patient  Setting: Ambulatory outpatient    Location: Office  Delivery: Face-to-face     HPI  [name], a 57 y.o. year old female, is here today because of her Chronic pain syndrome [G89.4]. [name] primary complain today is Back Pain (L>R)  Pertinent problems: [name] has Chronic pain syndrome; Presence of functional implant (Rechargable Medtronic Neurostimulator) (Cervical Epidural Leads); Spinal cord stimulator in situ (cervical leads); Chronic upper extremity pain (Right); RSD (reflex sympathetic dystrophy) (right upper extremity); Chronic low back pain (1ry area of Pain) (Bilateral) (R>L) w/o sciatica; Chronic hip pain (2ry area of Pain) (Right); Chronic wrist pain (3ry area of Pain) (Right) (CRPS); Chronic shoulder pain (Right); CRPS (complex regional pain syndrome), type I, upper (Right); Chronic pain of left knee; and Primary osteoarthritis of left knee on their pertinent problem list. Pain Assessment: Severity of Chronic pain is reported as a 2 /10. Location: Back Left, Mid, Lower/Sometimes in the hip pain can radiate. Onset: More than a month ago. Quality: Aching, Sharp. Timing: Intermittent. Modifying factor(s): "Active movements such as walking and medications. Vitals:   height is 5\' 7"  (1.702 m) and weight is 160 lb (72.6 kg). Her temporal temperature is 97.3 F (36.3 C) (abnormal). Her blood pressure is 140/87 (abnormal) and her pulse is 67. Her respiration is 16 and oxygen saturation is 100%.  BMI: Estimated body mass index is 25.06 kg/m as calculated from the following:   Height as of this encounter: 5\' 7"  (1.702 m).   Weight as of this encounter: 160 lb (72.6 kg). Last encounter: 09/07/2022. Last procedure: Visit date not found.  Reason for encounter: medication management.  The patient indicates doing well with the current medication regimen. No adverse reactions or side effects reported to the medications.  The patient forgot to bring her pills for inspection and count.  She is aware that this means that we will only provide her with 30 days of medication and she will need to return in 30 days for reassessment.  RTCB: 01/21/2023   Pharmacotherapy Assessment  Analgesic: Hydrocodone/APAP 10/325, 1 tab PO q 4 hrs (60 mg/day of hydrocodone) MME/day: 60 mg/day.   Monitoring: [place] PMP: PDMP reviewed during this encounter.       Pharmacotherapy: No side-effects or adverse reactions reported. Compliance: No problems identified. Effectiveness: Clinically acceptable.  Al Decant, RN  12/14/2022  8:30 AM  Sign when Signing Visit Safety precautions to be maintained throughout the outpatient stay will include: orient to surroundings, keep bed in low position, maintain call bell within reach at all times, provide assistance with transfer out of bed and ambulation.   Nursing Pain Medication Assessment:  Safety precautions to be maintained throughout the outpatient stay will include: orient to surroundings, keep bed in low position, maintain call bell within reach at all times, provide assistance with transfer out of bed and ambulation.  Medication Inspection Compliance: Ms.  Ardila did not comply with our request to bring her pills to be counted. She was reminded  that bringing the medication bottles, even when empty, is a requirement.  Medication: None brought in. Pill/Patch Count: None available to be counted. Bottle Appearance: No container available. Did not bring bottle(s) to appointment. Filled Date: N/A Last Medication intake:  Today    No results found for: "CBDTHCR" No results found for: "D8THCCBX" No results found for: "D9THCCBX"  UDS:  Summary  Date Value Ref Range Status  03/16/2022 Note  Final    Comment:    ==================================================================== [employer] Select 13 (MW) ==================================================================== Test                             Result       Flag       Units  Drug Present and Declared for Prescription Verification   Hydrocodone                    1469         EXPECTED   ng/mg creat   Hydromorphone                  359          EXPECTED   ng/mg creat   Dihydrocodeine                 233          EXPECTED   ng/mg creat   Norhydrocodone                 >5376        EXPECTED   ng/mg creat    Sources of hydrocodone include scheduled prescription medications.    Hydromorphone, dihydrocodeine and norhydrocodone are expected    metabolites of hydrocodone. Hydromorphone and dihydrocodeine are    also available as scheduled prescription medications.  Drug Absent but Declared for Prescription Verification   Alprazolam                     Not Detected UNEXPECTED ng/mg creat ==================================================================== Test                      Result    Flag   Units      Ref Range   Creatinine              93               mg/dL      >=20 ==================================================================== Declared Medications:  The flagging and interpretation on this report are based on the  following declared medications.  Unexpected results may arise from  inaccuracies in the declared medications.   **Note: The testing scope of this  panel includes these medications:   Alprazolam (Xanax)  Hydrocodone (Norco)   **Note: The testing scope of this panel does not include the  following reported medications:   Acetaminophen (Norco)  Acyclovir (Zovirax)  Levothyroxine (Synthroid)  Multivitamin  Naloxone (Narcan)  Naproxen (Aleve)  Triamcinolone (Kenalog)  Valacyclovir (Valtrex)  Venlafaxine (Effexor)  Vitamin D3 ==================================================================== For clinical consultation, please call [phone]. ====================================================================       ROS  Constitutional: Denies any fever or chills Gastrointestinal: No reported hemesis, hematochezia, vomiting, or acute GI distress Musculoskeletal: Denies any acute onset joint swelling, redness, loss of ROM, or weakness Neurological: No reported episodes of acute onset apraxia, aphasia, dysarthria, agnosia,  amnesia, paralysis, loss of coordination, or loss of consciousness  Medication Review  ALPRAZolam, Cholecalciferol, HYDROcodone-acetaminophen, acyclovir ointment, levothyroxine, multivitamin, naloxone, naproxen sodium, triamcinolone cream, valACYclovir, and venlafaxine XR  History Review  Allergy: Ms. Cosgrove is allergic to phenergan [promethazine hcl]. Drug: Ms. Milota  reports no history of drug use. Alcohol:  reports no history of alcohol use. Tobacco:  reports that she has never smoked. She has never used smokeless tobacco. Social: Ms. Jago  reports that she has never smoked. She has never used smokeless tobacco. She reports that she does not drink alcohol and does not use drugs. Medical:  has a past medical history of Depression, Hypothyroidism, and Thyroid disease. Surgical: Ms. Musch  has a past surgical history that includes Spinal cord stimulator implant (2003); Tubal ligation (1990); Fracture surgery (Right); and Breast cyst excision (Left). Family: family history includes Breast  cancer in her maternal grandmother; Breast cancer (age of onset: 1) in her mother; Depression in her mother. She was adopted.  Laboratory Chemistry Profile   Renal Lab Results  Component Value Date   BUN 17 01/10/2022   CREATININE 0.79 01/10/2022   BCR 22 01/10/2022   GFRAA 88 12/10/2019   GFRNONAA 76 12/10/2019    Hepatic Lab Results  Component Value Date   AST 27 01/10/2022   ALT 23 01/10/2022   ALBUMIN 4.9 01/10/2022   ALKPHOS 61 01/10/2022    Electrolytes Lab Results  Component Value Date   NA 143 01/10/2022   K 4.4 01/10/2022   CL 105 01/10/2022   CALCIUM 10.0 01/10/2022   MG 2.1 12/10/2019    Bone Lab Results  Component Value Date   25OHVITD1 84 12/10/2019   25OHVITD2 <1.0 12/10/2019   25OHVITD3 84 12/10/2019    Inflammation (CRP: Acute Phase) (ESR: Chronic Phase) Lab Results  Component Value Date   CRP <1 12/10/2019   ESRSEDRATE 2 12/10/2019         Note: Above Lab results reviewed.  Recent Imaging Review  DG Knee Complete 4 Views Left CLINICAL DATA:  Lateral knee pain  EXAM: LEFT KNEE - COMPLETE 4+ VIEW  COMPARISON:  None Available.  FINDINGS: Mild patellofemoral and medial compartment degenerative change. No fracture, dislocation, joint effusion, or other abnormality.  IMPRESSION: Mild patellofemoral and medial compartment degenerative change.  Electronically Signed   By: Dorise Bullion III M.D.   On: 11/17/2022 17:27 Note: Reviewed        Physical Exam  General appearance: Well nourished, well developed, and well hydrated. In no apparent acute distress Mental status: Alert, oriented x 3 (person, place, & time)       Respiratory: No evidence of acute respiratory distress Eyes: PERLA Vitals: BP (!) 140/87   Pulse 67   Temp (!) 97.3 F (36.3 C) (Temporal)   Resp 16   Ht 5\' 7"  (1.702 m)   Wt 160 lb (72.6 kg)   LMP 10/13/2016 (Within Days)   SpO2 100%   BMI 25.06 kg/m  BMI: Estimated body mass index is 25.06 kg/m as calculated  from the following:   Height as of this encounter: 5\' 7"  (1.702 m).   Weight as of this encounter: 160 lb (72.6 kg). Ideal: Ideal body weight: 61.6 kg (135 lb 12.9 oz) Adjusted ideal body weight: 66 kg (145 lb 7.7 oz)  Assessment   Diagnosis Status  1. Chronic pain syndrome   2. Chronic low back pain (1ry area of Pain) (Bilateral) (R>L) w/o sciatica   3. Chronic hip pain (2ry  area of Pain) (Right)   4. Chronic wrist pain (3ry area of Pain) (Right) (CRPS)   5. Complex regional pain syndrome type 1 of right upper extremity   6. Pharmacologic therapy   7. Chronic use of opiate for therapeutic purpose   8. Encounter for medication management   9. Encounter for chronic pain management    Controlled Controlled Controlled   Updated Problems: No problems updated.  Plan of Care  Problem-specific:  No problem-specific Assessment & Plan notes found for this encounter.  Ms. MAREN MANEY has a current medication list which includes the following long-term medication(s): levothyroxine, naloxone, venlafaxine xr, and [START ON 12/22/2022] hydrocodone-acetaminophen.  Pharmacotherapy (Medications Ordered): Meds ordered this encounter  Medications   HYDROcodone-acetaminophen (NORCO) 10-325 MG tablet    Sig: Take 1 tablet by mouth every 4 (four) hours as needed for severe pain. Must last 30 days    Dispense:  180 tablet    Refill:  0    DO NOT: delete (not duplicate); no partial-fill (will deny script to complete), no refill request (F/U required). DISPENSE: 1 day early if closed on fill date. WARN: No CNS-depressants within 8 hrs of med.   Orders:  No orders of the defined types were placed in this encounter.  Follow-up plan:   Return in about 5 weeks (around 01/21/2023) for Eval-day (M,W), (F2F), (MM).      Interventional Therapies  Risk  Complexity Considerations:   Estimated body mass index is 21.93 kg/m as calculated from the following:   Height as of this encounter: 5\' 7"   (1.702 m).   Weight as of this encounter: 140 lb (63.5 kg). WNL   Planned  Pending:      Under consideration:   Diagnostic bilateral lumbar facet block  Diagnostic IA right hip injection    Completed:   None since before 2020.   Therapeutic  Palliative (PRN) options:   Palliative right stellate ganglion block  Management of Cervical spinal cord stimulator       Recent Visits No visits were found meeting these conditions. Showing recent visits within past 90 days and meeting all other requirements Today's Visits Date Type Provider Dept  12/14/22 Office Visit Milinda Pointer, MD [facility]  Showing today's visits and meeting all other requirements Future Appointments No visits were found meeting these conditions. Showing future appointments within next 90 days and meeting all other requirements  I discussed the assessment and treatment plan with the patient. The patient was provided an opportunity to ask questions and all were answered. The patient agreed with the plan and demonstrated an understanding of the instructions.  Patient advised to call back or seek an in-person evaluation if the symptoms or condition worsens.  Duration of encounter: 30 minutes.  Total time on encounter, as per AMA guidelines included both the face-to-face and non-face-to-face time personally spent by the physician and/or other qualified health care professional(s) on the day of the encounter (includes time in activities that require the physician or other qualified health care professional and does not include time in activities normally performed by clinical staff). Physician's time may include the following activities when performed: Preparing to see the patient (e.g., pre-charting review of records, searching for previously ordered imaging, lab work, and nerve conduction tests) Review of prior analgesic pharmacotherapies. Reviewing PMP Interpreting ordered tests (e.g., lab work,  imaging, nerve conduction tests) Performing post-procedure evaluations, including interpretation of diagnostic procedures Obtaining and/or reviewing separately obtained history Performing a medically appropriate examination  and/or evaluation Counseling and educating the patient/family/caregiver Ordering medications, tests, or procedures Referring and communicating with other health care professionals (when not separately reported) Documenting clinical information in the electronic or other health record Independently interpreting results (not separately reported) and communicating results to the patient/ family/caregiver Care coordination (not separately reported)  Note by: Gaspar Cola, MD Date: 12/14/2022; Time: 8:38 AM

## 2022-12-14 NOTE — Patient Instructions (Signed)
____________________________________________________________________________________________  Opioid Pain Medication Update  To: All patients taking opioid pain medications. (I.e.: hydrocodone, hydromorphone, oxycodone, oxymorphone, morphine, codeine, methadone, tapentadol, tramadol, buprenorphine, fentanyl, etc.)  Re: Updated review of side effects and adverse reactions of opioid analgesics, as well as new information about long term effects of this class of medications.  Direct risks of long-term opioid therapy are not limited to opioid addiction and overdose. Potential medical risks include serious fractures, breathing problems during sleep, hyperalgesia, immunosuppression, chronic constipation, bowel obstruction, myocardial infarction, and tooth decay secondary to xerostomia.  Unpredictable adverse effects that can occur even if you take your medication correctly: Cognitive impairment, respiratory depression, and death. Most people think that if they take their medication "correctly", and "as instructed", that they will be safe. Nothing could be farther from the truth. In reality, a significant amount of recorded deaths associated with the use of opioids has occurred in individuals that had taken the medication for a long time, and were taking their medication correctly. The following are examples of how this can happen: Patient taking his/her medication for a long time, as instructed, without any side effects, is given a certain antibiotic or another unrelated medication, which in turn triggers a "Drug-to-drug interaction" leading to disorientation, cognitive impairment, impaired reflexes, respiratory depression or an untoward event leading to serious bodily harm or injury, including death.  Patient taking his/her medication for a long time, as instructed, without any side effects, develops an acute impairment of liver and/or kidney function. This will lead to a rapid inability of the body to  breakdown and eliminate their pain medication, which will result in effects similar to an "overdose", but with the same medicine and dose that they had always taken. This again may lead to disorientation, cognitive impairment, impaired reflexes, respiratory depression or an untoward event leading to serious bodily harm or injury, including death.  A similar problem will occur with patients as they grow older and their liver and kidney function begins to decrease as part of the aging process.  Background information: Historically, the original case for using long-term opioid therapy to treat chronic noncancer pain was based on safety assumptions that subsequent experience has called into question. In 1996, the American Pain Society and the [place] [employer] issued a consensus statement supporting long-term opioid therapy. This statement acknowledged the dangers of opioid prescribing but concluded that the risk for addiction was low; respiratory depression induced by opioids was short-lived, occurred mainly in opioid-naive patients, and was antagonized by pain; tolerance was not a common problem; and efforts to control diversion should not constrain opioid prescribing. This has now proven to be wrong. Experience regarding the risks for opioid addiction, misuse, and overdose in community practice has failed to support these assumptions.  According to the Centers for Disease Control and Prevention, fatal overdoses involving opioid analgesics have increased sharply over the past decade. Currently, more than 96,700 people die from drug overdoses every year. Opioids are a factor in 7 out of every 10 overdose deaths. Deaths from drug overdose have surpassed motor vehicle accidents as the leading cause of death for individuals between the ages of 3 and 36.  Clinical data suggest that neuroendocrine dysfunction may be very common in both men and women, potentially causing hypogonadism, erectile  dysfunction, infertility, decreased libido, osteoporosis, and depression. Recent studies linked higher opioid dose to increased opioid-related mortality. Controlled observational studies reported that long-term opioid therapy may be associated with increased risk for cardiovascular events. Subsequent meta-analysis concluded  that the safety of long-term opioid therapy in elderly patients has not been proven.   Side Effects and adverse reactions: Common side effects: Drowsiness (sedation). Dizziness. Nausea and vomiting. Constipation. Physical dependence -- Dependence often manifests with withdrawal symptoms when opioids are discontinued or decreased. Tolerance -- As you take repeated doses of opioids, you require increased medication to experience the same effect of pain relief. Respiratory depression -- This can occur in healthy people, especially with higher doses. However, people with COPD, asthma or other lung conditions may be even more susceptible to fatal respiratory impairment.  Uncommon side effects: An increased sensitivity to feeling pain and extreme response to pain (hyperalgesia). Chronic use of opioids can lead to this. Delayed gastric emptying (the process by which the contents of your stomach are moved into your small intestine). Muscle rigidity. Immune system and hormonal dysfunction. Quick, involuntary muscle jerks (myoclonus). Arrhythmia. Itchy skin (pruritus). Dry mouth (xerostomia).  Long-term side effects: Chronic constipation. Sleep-disordered breathing (SDB). Increased risk of bone fractures. Hypothalamic-pituitary-adrenal dysregulation. Increased risk of overdose.  RISKS: Fractures and Falls:  Opioids increase the risk and incidence of falls. This is of particular importance in elderly patients.  Endocrine System:  Long-term administration is associated with endocrine abnormalities (endocrinopathies). (Also known as Opioid-induced Endocrinopathy) Influences  on both the hypothalamic-pituitary-adrenal axis?and the hypothalamic-pituitary-gonadal axis have been demonstrated with consequent hypogonadism and adrenal insufficiency in both sexes. Hypogonadism and decreased levels of dehydroepiandrosterone sulfate have been reported in men and women. Endocrine effects include: Amenorrhoea in women (abnormal absence of menstruation) Reduced libido in both sexes Decreased sexual function Erectile dysfunction in men Hypogonadisms (decreased testicular function with shrinkage of testicles) Infertility Depression and fatigue Loss of muscle mass Anxiety Depression Immune suppression Hyperalgesia Weight gain Anemia Osteoporosis Patients (particularly women of childbearing age) should avoid opioids. There is insufficient evidence to recommend routine monitoring of asymptomatic patients taking opioids in the long-term for hormonal deficiencies.  Immune System: Human studies have demonstrated that opioids have an immunomodulating effect. These effects are mediated via opioid receptors both on immune effector cells and in the central nervous system. Opioids have been demonstrated to have adverse effects on antimicrobial response and anti-tumour surveillance. Buprenorphine has been demonstrated to have no impact on immune function.  Opioid Induced Hyperalgesia: Human studies have demonstrated that prolonged use of opioids can lead to a state of abnormal pain sensitivity, sometimes called opioid induced hyperalgesia (OIH). Opioid induced hyperalgesia is not usually seen in the absence of tolerance to opioid analgesia. Clinically, hyperalgesia may be diagnosed if the patient on long-term opioid therapy presents with increased pain. This might be qualitatively and anatomically distinct from pain related to disease progression or to breakthrough pain resulting from development of opioid tolerance. Pain associated with hyperalgesia tends to be more diffuse than the  pre-existing pain and less defined in quality. Management of opioid induced hyperalgesia requires opioid dose reduction.  Cancer: Chronic opioid therapy has been associated with an increased risk of cancer among noncancer patients with chronic pain. This association was more evident in chronic strong opioid users. Chronic opioid consumption causes significant pathological changes in the small intestine and colon. Epidemiological studies have found that there is a link between opium dependence and initiation of gastrointestinal cancers. Cancer is the second leading cause of death after cardiovascular disease. Chronic use of opioids can cause multiple conditions such as GERD, immunosuppression and renal damage as well as carcinogenic effects, which are associated with the incidence of cancers.   Mortality: Long-term opioid use   has been associated with increased mortality among patients with chronic non-cancer pain (CNCP).  Prescription of long-acting opioids for chronic noncancer pain was associated with a significantly increased risk of all-cause mortality, including deaths from causes other than overdose.  Reference: Von Korff M, Kolodny A, Deyo RA, Chou R. Long-term opioid therapy reconsidered. Ann Intern Med. 2011 Sep 6;155(5):325-8. doi: 10.7326/[id number]. PMID: [id number]; PMCIDXX:1631110. Morley Kos, Hayward RA, Dunn KM, [place] KP. Risk of adverse events in patients prescribed long-term opioids: A cohort study in the [place] Clinical Practice Research D[employer]. Eur J Pain. 2019 May;23(5):908-922. doi: 10.1002/ejp.1357. Epub 2019 Jan 31. PMID: [id number]. Colameco S, Coren JS, Ciervo CA. Continuous opioid treatment for chronic noncancer pain: a time for moderation in prescribing. Postgrad Med. 2009 Jul;121(4):61-6. doi: 10.3810/pgm.2009.07.2032. PMID: [id number]. Heywood Bene RN, [place] SD, Blazina I, Rosalio Loud, Bougatsos C, Deyo RA. The  effectiveness and risks of long-term opioid therapy for chronic pain: a systematic review for a [employer] of Health Pathways to [employer]. Ann Intern Med. 2015 Feb 17;162(4):276-86. doi: [id number]. PMID: [id number]. Marjory Sneddon [facility], [place] DM. NCHS Data Brief No. 22. [place]: Centers for Disease Control and Prevention; 2009. Sep, Increase in Fatal Poisonings Involving Opioid Analgesics in the [place], 1999-2006. Song IA, Choi HR, Oh TK. Long-term opioid use and mortality in patients with chronic non-cancer pain: Ten-year follow-up study in [place] from 2010 through 2019. EClinicalMedicine. 2022 Jul 18;51:101558. doi: 10.1016/j.eclinm.2022.UB:5887891. PMID: [id number]; PMCIDOX:8550940. Huser, W., Schubert, T., Vogelmann, T. et al. All-cause mortality in patients with long-term opioid therapy compared with non-opioid analgesics for chronic non-cancer pain: a database study. [place] Med 18, 162 (2020). [email or web address] Rashidian H, Roxy Cedar, Malekzadeh R, Haghdoost AA. An Ecological Study of the Association between Opiate Use and Incidence of Cancers. Addict Health. 2016 Fall;8(4):252-260. PMID: [id number]; PMCIDQI:9185013.  Our Goal: Our goal is to control your pain with means other than the use of opioid pain medications.  Our Recommendation: Talk to your physician about coming off of these medications. We can assist you with the tapering down and stopping these medicines. Based on the new information, even if you cannot completely stop the medication, a decrease in the dose may be associated with a lesser risk. Ask for other means of controlling the pain. Decrease or eliminate those factors that significantly contribute to your pain such as smoking, obesity, and a diet heavily tilted towards "inflammatory" nutrients.  Last Updated: 11/16/2022    ____________________________________________________________________________________________     ____________________________________________________________________________________________  Patient Information update  To: All of our patients.  Re: Name change.  It has been made official that our current name, "[facility]"   will soon be changed to "[facility]".   The purpose of this change is to eliminate any confusion created by the concept of our practice being a "Medication Management Pain Clinic". In the past this has led to the misconception that we treat pain primarily by the use of prescription medications.  Nothing can be farther from the truth.   Understanding PAIN MANAGEMENT: To further understand what our practice does, you first have to understand that "Pain Management" is a subspecialty that requires additional training once a physician has completed their specialty training, which can be in either Anesthesia, Neurology, Psychiatry, or Physical Medicine and Rehabilitation (PMR). Each one of these contributes to the final approach taken by each physician to  the management of their patient's pain. To be a "Pain Management Specialist" you must have first completed one of the specialty trainings below.  Anesthesiologists - trained in clinical pharmacology and interventional techniques such as nerve blockade and regional as well as central neuroanatomy. They are trained to block pain before, during, and after surgical interventions.  Neurologists - trained in the diagnosis and pharmacological treatment of complex neurological conditions, such as Multiple Sclerosis, Parkinson's, spinal cord injuries, and other systemic conditions that may be associated with symptoms that may include but are not limited to pain. They tend to rely primarily on the treatment of chronic pain  using prescription medications.  Psychiatrist - trained in conditions affecting the psychosocial wellbeing of patients including but not limited to depression, anxiety, schizophrenia, personality disorders, addiction, and other substance use disorders that may be associated with chronic pain. They tend to rely primarily on the treatment of chronic pain using prescription medications.   Physical Medicine and Rehabilitation (PMR) physicians, also known as physiatrists - trained to treat a wide variety of medical conditions affecting the brain, spinal cord, nerves, bones, joints, ligaments, muscles, and tendons. Their training is primarily aimed at treating patients that have suffered injuries that have caused severe physical impairment. Their training is primarily aimed at the physical therapy and rehabilitation of those patients. They may also work alongside orthopedic surgeons or neurosurgeons using their expertise in assisting surgical patients to recover after their surgeries.  INTERVENTIONAL PAIN MANAGEMENT is sub-subspecialty of Pain Management.  Our physicians are Board-certified in Anesthesia, Pain Management, and Interventional Pain Management.  This meaning that not only have they been trained and Board-certified in their specialty of Anesthesia, and subspecialty of Pain Management, but they have also received further training in the sub-subspecialty of Interventional Pain Management, in order to become Board-certified as INTERVENTIONAL PAIN MANAGEMENT SPECIALIST.    Mission: Our goal is to use our skills in  [place] as alternatives to the chronic use of prescription opioid medications for the treatment of pain. To make this more clear, we have changed our name to reflect what we do and offer. We will continue to offer medication management assessment and recommendations, but we will not be taking over any patient's medication  management.  ____________________________________________________________________________________________     ____________________________________________________________________________________________  National Pain Medication Shortage  The [place] is experiencing worsening drug shortages. These have had a negative widespread effect on patient care and treatment. Not expected to improve any time soon. Predicted to last past 2029.   Drug shortage list (generic names) Oxycodone IR Oxycodone/APAP Oxymorphone IR Hydromorphone Hydrocodone/APAP Morphine  Where is the problem?  Manufacturing and supply level.  Will this shortage affect you?  Only if you take any of the above pain medications.  How? You may be unable to fill your prescription.  Your pharmacist may offer a "partial fill" of your prescription. (Warning: Do not accept partial fills.) Prescriptions partially filled cannot be transferred to another pharmacy. Read our Medication Rules and Regulation. Depending on how much medicine you are dependent on, you may experience withdrawals when unable to get the medication.  Recommendations: Consider ending your dependence on opioid pain medications. Ask your pain specialist to assist you with the process. Consider switching to a medication currently not in shortage, such as Buprenorphine. Talk to your pain specialist about this option. Consider decreasing your pain medication requirements by managing tolerance thru "Drug Holidays". This may help minimize withdrawals, should you run out of medicine. Control your pain thru  the use of non-pharmacological interventional therapies.   Your prescriber: Prescribers cannot be blamed for shortages. Medication manufacturing and supply issues cannot be fixed by the prescriber.   NOTE: The prescriber is not responsible for supplying the medication, or solving supply issues. Work with your pharmacist to solve it. The patient is responsible for  the decision to take or continue taking the medication and for identifying and securing a legal supply source. By law, supplying the medication is the job and responsibility of the pharmacy. The prescriber is responsible for the evaluation, monitoring, and prescribing of these medications.   Prescribers will NOT: Re-issue prescriptions that have been partially filled. Re-issue prescriptions already sent to a pharmacy.  Re-send prescriptions to a different pharmacy because yours did not have your medication. Ask pharmacist to order more medicine or transfer the prescription to another pharmacy. (Read below.)  New 2023 regulation: "May 20, 2022 Revised Regulation Allows DEA-Registered Pharmacies to Transfer Electronic Prescriptions at a Patient's Request [place] Patients now have the ability to request their electronic prescription be transferred to another pharmacy without having to go back to their practitioner to initiate the request. This revised regulation went into effect on Monday, May 16, 2022.     At a patient's request, a DEA-registered retail pharmacy can now transfer an electronic prescription for a controlled substance (schedules II-V) to another DEA-registered retail pharmacy. Prior to this change, patients would have to go through their practitioner to cancel their prescription and have it re-issued to a different pharmacy. The process was taxing and time consuming for both patients and practitioners.    The Drug Enforcement Administration [facility]) published its intent to revise the process for transferring electronic prescriptions on August 07, 2020.  The final rule was published in the federal register on April 14, 2022 and went into effect 30 days later.  Under the final rule, a prescription can only be transferred once between pharmacies, and only if allowed under existing state or other applicable law. The prescription must  remain in its electronic form; may not be altered in any way; and the transfer must be communicated directly between two licensed pharmacists. It's important to note, any authorized refills transfer with the original prescription, which means the entire prescription will be filled at the same pharmacy".  Reference: [employer]at [facility] website announcement)  [email or web address].pdf ([place])   [employer] / Vol. 88, No. 143 / Thursday, April 14, 2022 / Rules and Regulations DEPARTMENT OF JUSTICE  Drug Enforcement Administration  21 CFR Part 1306  [Docket No. [id number]]  RIN [id number] Transfer of Electronic Prescriptions for Schedules II-V Controlled Substances Between Pharmacies for Initial Filling  ____________________________________________________________________________________________     ____________________________________________________________________________________________  Transfer of Pain Medication between Pharmacies  Re: 2023 DEA Clarification on existing regulation  Published on DEA Website: May 20, 2022  Title: Revised Regulation Allows DEA-Registered Pharmacies to Conservator, museum/gallery Prescriptions at a Patient's Request [place]  "Patients now have the ability to request their electronic prescription be transferred to another pharmacy without having to go back to their practitioner to initiate the request. This revised regulation went into effect on Monday, May 16, 2022.     At a patient's request, a DEA-registered retail pharmacy can now transfer an electronic prescription for a controlled substance (schedules II-V) to another DEA-registered retail pharmacy. Prior to this change, patients would have to go through their practitioner to  cancel their prescription  and have it re-issued to a different pharmacy. The process was taxing and time consuming for both patients and practitioners.    The Drug Enforcement Administration (DEA) published its intent to revise the process for transferring electronic prescriptions on August 07, 2020.  The final rule was published in the federal register on April 14, 2022 and went into effect 30 days later.  Under the final rule, a prescription can only be transferred once between pharmacies, and only if allowed under existing state or other applicable law. The prescription must remain in its electronic form; may not be altered in any way; and the transfer must be communicated directly between two licensed pharmacists. It's important to note, any authorized refills transfer with the original prescription, which means the entire prescription will be filled at the same pharmacy."    REFERENCES: 1. DEA website announcement https://www.dea.gov/stories/2023/2023-05/2022-09-01/revised-regulation-allows-dea-registered-pharmacies-transfer  2. Department of Justice website  https://www.govinfo.gov/content/pkg/FR-2022-04-14/pdf/2023-15847.pdf  3. DEPARTMENT OF JUSTICE Drug Enforcement Administration 21 CFR Part 1306 [Docket No. [id number]] RIN [id number] "Transfer of Electronic Prescriptions for Schedules II-V Controlled Substances Between Pharmacies for Initial Filling"  ____________________________________________________________________________________________     _______________________________________________________________________  Medication Rules  Purpose: To inform patients, and their family members, of our medication rules and regulations.  Applies to: All patients receiving prescriptions from our practice (written or electronic).  Pharmacy of record: This is the pharmacy where your electronic prescriptions will be sent. Make sure we have the correct one.  Electronic prescriptions: In  compliance with the [place] Strengthen Opioid Misuse Prevention (STOP) Act of 2017 (Session Law 2017-74/H243), effective September 19, 2018, all controlled substances must be electronically prescribed. Written prescriptions, faxing, or calling prescriptions to a pharmacy will no longer be done.  Prescription refills: These will be provided only during in-person appointments. No medications will be renewed without a "face-to-face" evaluation with your provider. Applies to all prescriptions.  NOTE: The following applies primarily to controlled substances (Opioid* Pain Medications).   Type of encounter (visit): For patients receiving controlled substances, face-to-face visits are required. (Not an option and not up to the patient.)  Patient's responsibilities: Pain Pills: Bring all pain pills to every appointment (except for procedure appointments). Pill Bottles: Bring pills in original pharmacy bottle. Bring bottle, even if empty. Always bring the bottle of the most recent fill.  Medication refills: You are responsible for knowing and keeping track of what medications you are taking and when is it that you will need a refill. The day before your appointment: write a list of all prescriptions that need to be refilled. The day of the appointment: give the list to the admitting nurse. Prescriptions will be written only during appointments. No prescriptions will be written on procedure days. If you forget a medication: it will not be "Called in", "Faxed", or "electronically sent". You will need to get another appointment to get these prescribed. No early refills. Do not call asking to have your prescription filled early. Partial  or short prescriptions: Occasionally your pharmacy may not have enough pills to fill your prescription.  NEVER ACCEPT a partial fill or a prescription that is short of the total amount of pills that you were prescribed.  With controlled substances the law allows 72 hours for  the pharmacy to complete the prescription.  If the prescription is not completed within 72 hours, the pharmacist will require a new prescription to be written. This means that you will be short on your medicine and we WILL NOT send another prescription to complete your original   prescription.  Instead, request the pharmacy to send a carrier to a nearby branch to get enough medication to provide you with your full prescription. Prescription Accuracy: You are responsible for carefully inspecting your prescriptions before leaving our office. Have the discharge nurse carefully go over each prescription with you, before taking them home. Make sure that your name is accurately spelled, that your address is correct. Check the name and dose of your medication to make sure it is accurate. Check the number of pills, and the written instructions to make sure they are clear and accurate. Make sure that you are given enough medication to last until your next medication refill appointment. Taking Medication: Take medication as prescribed. When it comes to controlled substances, taking less pills or less frequently than prescribed is permitted and encouraged. Never take more pills than instructed. Never take the medication more frequently than prescribed.  Inform other Doctors: Always inform, all of your healthcare providers, of all the medications you take. Pain Medication from other Providers: You are not allowed to accept any additional pain medication from any other Doctor or Healthcare provider. There are two exceptions to this rule. (see below) In the event that you require additional pain medication, you are responsible for notifying us, as stated below. Cough Medicine: Often these contain an opioid, such as codeine or hydrocodone. Never accept or take cough medicine containing these opioids if you are already taking an opioid* medication. The combination may cause respiratory failure and death. Medication Agreement:  You are responsible for carefully reading and following our Medication Agreement. This must be signed before receiving any prescriptions from our practice. Safely store a copy of your signed Agreement. Violations to the Agreement will result in no further prescriptions. (Additional copies of our Medication Agreement are available upon request.) Laws, Rules, & Regulations: All patients are expected to follow all Federal and [employer], Rules, [facility]. Ignorance of the Laws does not constitute a valid excuse.  Illegal drugs and Controlled Substances: The use of illegal substances (including, but not limited to marijuana and its derivatives) and/or the illegal use of any controlled substances is strictly prohibited. Violation of this rule may result in the immediate and permanent discontinuation of any and all prescriptions being written by our practice. The use of any illegal substances is prohibited. Adopted CDC guidelines & recommendations: Target dosing levels will be at or below 60 MME/day. Use of benzodiazepines** is not recommended.  Exceptions: There are only two exceptions to the rule of not receiving pain medications from other Healthcare Providers. Exception #1 (Emergencies): In the event of an emergency (i.e.: accident requiring emergency care), you are allowed to receive additional pain medication. However, you are responsible for: As soon as you are able, call our office [phone], at any time of the day or night, and leave a message stating your name, the date and nature of the emergency, and the name and dose of the medication prescribed. In the event that your call is answered by a member of our staff, make sure to document and save the date, time, and the name of the person that took your information.  Exception #2 (Planned Surgery): In the event that you are scheduled by another doctor or dentist to have any type of surgery or procedure, you are allowed (for a period no  longer than 30 days), to receive additional pain medication, for the acute post-op pain. However, in this case, you are responsible for picking up a copy of  our "Post-op Pain Management for Surgeons" handout, and giving it to your surgeon or dentist. This document is available at our office, and does not require an appointment to obtain it. Simply go to our office during business hours (Monday-Thursday from 8:00 AM to 4:00 PM) (Friday 8:00 AM to 12:00 Noon) or if you have a scheduled appointment with [place], prior to your surgery, and ask for it by name. In addition, you are responsible for: calling our office [phone], at any time of the day or night, and leaving a message stating your name, name of your surgeon, type of surgery, and date of procedure or surgery. Failure to comply with your responsibilities may result in termination of therapy involving the controlled substances. Medication Agreement Violation. Following the above rules, including your responsibilities will help you in avoiding a Medication Agreement Violation ("Breaking your Pain Medication Contract").  Consequences:  Not following the above rules may result in permanent discontinuation of medication prescription therapy.  *Opioid medications include: morphine, codeine, oxycodone, oxymorphone, hydrocodone, hydromorphone, meperidine, tramadol, tapentadol, buprenorphine, fentanyl, methadone. **Benzodiazepine medications include: diazepam (Valium), alprazolam (Xanax), clonazepam (Klonopine), lorazepam (Ativan), clorazepate (Tranxene), chlordiazepoxide (Librium), estazolam (Prosom), oxazepam (Serax), temazepam (Restoril), triazolam (Halcion) (Last updated: 07/12/2022) ______________________________________________________________________    ______________________________________________________________________  Medication Recommendations and Reminders  Applies to: All patients receiving prescriptions (written and/or  electronic).  Medication Rules & Regulations: You are responsible for reading, knowing, and following our "Medication Rules" document. These exist for your safety and that of others. They are not flexible and neither are we. Dismissing or ignoring them is an act of "non-compliance" that may result in complete and irreversible termination of such medication therapy. For safety reasons, "non-compliance" will not be tolerated. As with the [place] fundamental legal principle of "ignorance of the law is no defense", we will accept no excuses for not having read and knowing the content of documents provided to you by our practice.  Pharmacy of record:  Definition: This is the pharmacy where your electronic prescriptions will be sent.  We do not endorse any particular pharmacy. It is up to you and your insurance to decide what pharmacy to use.  We do not restrict you in your choice of pharmacy. However, once we write for your prescriptions, we will NOT be re-sending more prescriptions to fix restricted supply problems created by your pharmacy, or your insurance.  The pharmacy listed in the electronic medical record should be the one where you want electronic prescriptions to be sent. If you choose to change pharmacy, simply notify our nursing staff. Changes will be made only during your regular appointments and not over the phone.  Recommendations: Keep all of your pain medications in a safe place, under lock and key, even if you live alone. We will NOT replace lost, stolen, or damaged medication. We do not accept "Police Reports" as proof of medications having been stolen. After you fill your prescription, take 1 week's worth of pills and put them away in a safe place. You should keep a separate, properly labeled bottle for this purpose. The remainder should be kept in the original bottle. Use this as your primary supply, until it runs out. Once it's gone, then you know that you have 1 week's worth of medicine,  and it is time to come in for a prescription refill. If you do this correctly, it is unlikely that you will ever run out of medicine. To make sure that the above recommendation works, it is very important that you make  sure your medication refill appointments are scheduled at least 1 week before you run out of medicine. To do this in an effective manner, make sure that you do not leave the office without scheduling your next medication management appointment. Always ask the nursing staff to show you in your prescription , when your medication will be running out. Then arrange for the receptionist to get you a return appointment, at least 7 days before you run out of medicine. Do not wait until you have 1 or 2 pills left, to come in. This is very poor planning and does not take into consideration that we may need to cancel appointments due to bad weather, sickness, or emergencies affecting our staff. DO NOT ACCEPT A "Partial Fill": If for any reason your pharmacy does not have enough pills/tablets to completely fill or refill your prescription, do not allow for a "partial fill". The law allows the pharmacy to complete that prescription within 72 hours, without requiring a new prescription. If they do not fill the rest of your prescription within those 72 hours, you will need a separate prescription to fill the remaining amount, which we will NOT provide. If the reason for the partial fill is your insurance, you will need to talk to the pharmacist about payment alternatives for the remaining tablets, but again, DO NOT ACCEPT A PARTIAL FILL, unless you can trust your pharmacist to obtain the remainder of the pills within 72 hours.  Prescription refills and/or changes in medication(s):  Prescription refills, and/or changes in dose or medication, will be conducted only during scheduled medication management appointments. (Applies to both, written and electronic prescriptions.) No refills on procedure days. No  medication will be changed or started on procedure days. No changes, adjustments, and/or refills will be conducted on a procedure day. Doing so will interfere with the diagnostic portion of the procedure. No phone refills. No medications will be "called into the pharmacy". No Fax refills. No weekend refills. No Holliday refills. No after hours refills.  Remember:  Business hours are:  Monday to Thursday 8:00 AM to 4:00 PM Provider's Schedule: Milinda Pointer, MD - Appointments are:  Medication management: Monday and Wednesday 8:00 AM to 4:00 PM Procedure day: Tuesday and Thursday 7:30 AM to 4:00 PM Gillis Santa, MD - Appointments are:  Medication management: Tuesday and Thursday 8:00 AM to 4:00 PM Procedure day: Monday and Wednesday 7:30 AM to 4:00 PM (Last update: 07/12/2022) ______________________________________________________________________    ____________________________________________________________________________________________  Drug Holidays  What is a "Drug Holiday"? Drug Holiday: is the name given to the process of slowly tapering down and temporarily stopping the pain medication for the purpose of decreasing or eliminating tolerance to the drug.  Benefits Improved effectiveness Decreased required effective dose Improved pain control End dependence on high dose therapy Decrease cost of therapy Uncovering "opioid-induced hyperalgesia". (OIH)  What is "opioid hyperalgesia"? It is a paradoxical increase in pain caused by exposure to opioids. Stopping the opioid pain medication, contrary to the expected, it actually decreases or completely eliminates the pain. Ref.: "A comprehensive review of opioid-induced hyperalgesia". Brion Aliment, et.al. Pain Physician. 2011 Mar-Apr;14(2):145-61.  What is tolerance? Tolerance: the progressive loss of effectiveness of a pain medicine due to repetitive use. A common problem of opioid pain medications.  How long should a "Drug  Holiday" last? Effectiveness depends on the patient staying off all opioid pain medicines for a minimum of 14 consecutive days. (2 weeks)  How about just taking less of the medicine? Does not  work. Will not accomplish goal of eliminating the excess receptors.  How about switching to a different pain medicine? (AKA. "Opioid rotation") Does not work. Creates the illusion of effectiveness by taking advantage of inaccurate equivalent dose calculations between different opioids. -This "technique" was promoted by studies funded by [employer], such as [employer], creators of "OxyContin".  Can I stop the medicine "cold [place]"? We do not recommend it. You should always coordinate with your prescribing physician to make the transition as smoothly as possible. Avoid stopping the medicine abruptly without consulting. We recommend a "slow taper".  What is a slow taper? Taper: refers to the gradual decrease in dose.   How do I stop/taper the dose? Slowly. Decrease the daily amount of pills that you take by one (1) pill every seven (7) days. This is called a "slow downward taper". Example: if you normally take four (4) pills per day, drop it to three (3) pills per day for seven (7) days, then to two (2) pills per day for seven (7) days, then to one (1) per day for seven (7) days, and then stop the medicine. The 14 day "Drug Holiday" starts on the first day without medicine.   Will I experience withdrawals? Unlikely with a slow taper.  What triggers withdrawals? Withdrawals are triggered by the sudden/abrupt stop of high dose opioids. Withdrawals can be avoided by slowly decreasing the dose over a prolonged period of time.  What are withdrawals? Symptoms associated with sudden/abrupt reduction/stopping of high-dose, long-term use of pain medication. Withdrawal are seldom seen on low dose therapy, or patients rarely taking opioid medication.  Early Withdrawal Symptoms may  include: Agitation Anxiety Muscle aches Increased tearing Insomnia Runny nose Sweating Yawning  Late symptoms may include: Abdominal cramping Diarrhea Dilated pupils Goose bumps Nausea Vomiting  When could I see withdrawals? Onset: 8-24 hours after last use for most opioids. 12-48 hours for long-acting opioids (i.e.: methadone)  How long could they last? Duration: 4-10 days for most opioids. 14-21 days for long-acting opioids (i.e.: methadone)  What will happen after I complete my "Drug Holiday"? The need and indications for the opioid analgesic will be reviewed before restarting the medication. Dose requirements will likely decrease and the dose will need to be adjusted accordingly.   (Last update: 12/07/2022) ____________________________________________________________________________________________    ____________________________________________________________________________________________  WARNING: CBD (cannabidiol) & Delta (Delta-8 tetrahydrocannabinol) products.   Applicable to:  All individuals currently taking or considering taking CBD (cannabidiol) and, more important, all patients taking opioid analgesic controlled substances (pain medication). (Example: oxycodone; oxymorphone; hydrocodone; hydromorphone; morphine; methadone; tramadol; tapentadol; fentanyl; buprenorphine; butorphanol; dextromethorphan; meperidine; codeine; etc.)  Introduction:  Recently there has been a drive towards the use of "natural" products for the treatment of different conditions, including pain anxiety and sleep disorders. Marijuana and hemp are two varieties of the cannabis genus plants. Marijuana and its derivatives are illegal, while hemp and its derivatives are not. Cannabidiol (CBD) and tetrahydrocannabinol (THC), are two natural compounds found in plants of the Cannabis genus. They can both be extracted from hemp or marijuana. Both compounds interact with your body's endocannabinoid  system in very different ways. CBD is associated with pain relief (analgesia) while THC is associated with the psychoactive effects ("the high") obtained from the use of marijuana products. There are two main types of THC: Delta-9, which comes from the marijuana plant and it is illegal, and Delta-8, which comes from the hemp plant, and it is legal. (Both, Delta-9-THC and Delta-8-THC are psychoactive and  give you "the high".)   Legality:  Marijuana and its derivatives: illegal Hemp and its derivatives: Legal (State dependent) UPDATE: (11/05/2021) The Drug Enforcement Agency ([place]) issued a letter stating that "delta" cannabinoids, including Delta-8-THCO and Delta-9-THCO, synthetically derived from hemp do not qualify as hemp and will be viewed as Schedule I drugs. (Schedule I drugs, substances, or chemicals are defined as drugs with no currently accepted medical use and a high potential for abuse. Some examples of Schedule I drugs are: heroin, lysergic acid diethylamide (LSD), marijuana (cannabis), 3,4-methylenedioxymethamphetamine (ecstasy), methaqualone, and peyote.) (https://jennings.com/)  Legal status of CBD in [place]:  "Conditionally Legal"  Reference: "FDA Regulation of Cannabis and Cannabis-Derived Products, Including Cannabidiol (CBD)" - Se[employer]Art[employer].com.pt  Warning:  CBD is not FDA approved and has not undergo the same manufacturing controls as prescription drugs.  This means that the purity and safety of available CBD may be questionable. Most of the time, despite manufacturer's claims, it is contaminated with THC (delta-9-tetrahydrocannabinol - the chemical in marijuana responsible for the "HIGH").  When this is the case, the [facility] contaminant will trigger a positive urine drug screen (UDS) test for Marijuana (carboxy-THC).   The FDA recently put out a warning about 5 things that everyone  should be aware of regarding Delta-8 THC: Delta-8 THC products have not been evaluated or approved by the FDA for safe use and may be marketed in ways that put the public health at risk. The FDA has received adverse event reports involving delta-8 THC-containing products. Delta-8 THC has psychoactive and intoxicating effects. Delta-8 THC manufacturing often involve use of potentially harmful chemicals to create the concentrations of delta-8 THC claimed in the marketplace. The final delta-8 THC product may have potentially harmful by-products (contaminants) due to the chemicals used in the process. Manufacturing of delta-8 THC products may occur in uncontrolled or unsanitary settings, which may lead to the presence of unsafe contaminants or other potentially harmful substances. Delta-8 THC products should be kept out of the reach of children and pets.  NOTE: Because a positive UDS for any illicit substance is a violation of our medication agreement, your opioid analgesics (pain medicine) may be permanently discontinued.  MORE ABOUT CBD  General Information: CBD was discovered in [birth year] and it is a derivative of the cannabis sativa genus plants (Marijuana and Hemp). It is one of the 113 identified substances found in Marijuana. It accounts for up to 40% of the plant's extract. As of 2018, preliminary clinical studies on CBD included research for the treatment of anxiety, movement disorders, and pain. CBD is available and consumed in multiple forms, including inhalation of smoke or vapor, as an aerosol spray, and by mouth. It may be supplied as an oil containing CBD, capsules, dried cannabis, or as a liquid solution. CBD is thought not to be as psychoactive as THC (delta-9-tetrahydrocannabinol - the chemical in marijuana responsible for the "HIGH"). Studies suggest that CBD may interact with different biological target receptors in the body, including cannabinoid and other neurotransmitter receptors. As of  2018 the mechanism of action for its biological effects has not been determined.  Side-effects  Adverse reactions: Dry mouth, diarrhea, decreased appetite, fatigue, drowsiness, malaise, weakness, sleep disturbances, and others.  Drug interactions:  CBD may interact with medications such as blood-thinners. CBD causes drowsiness on its own and it will increase drowsiness caused by other medications, including antihistamines (such as Benadryl), benzodiazepines (Xanax, Ativan, Valium), antipsychotics, antidepressants, opioids, alcohol and supplements such as kava, melatonin and St. John's Wort.  Other drug interactions: Brivaracetam (Briviact); Caffeine; Carbamazepine (Tegretol); Citalopram (Celexa); Clobazam (Onfi); Eslicarbazepine (Aptiom); Everolimus (Zostress); Lithium; Methadone (Dolophine); Rufinamide (Banzel); Sedative medications (CNS depressants); Sirolimus (Rapamune); Stiripentol (Diacomit); Tacrolimus (Prograf); Tamoxifen ; Soltamox); Topiramate (Topamax); Valproate; Warfarin (Coumadin); Zonisamide. (Last update: 08/29/2022) ____________________________________________________________________________________________   ____________________________________________________________________________________________  Naloxone Nasal Spray  Why am I receiving this medication? [place] STOP ACT requires that all patients taking high dose opioids or at risk of opioids respiratory depression, be prescribed an opioid reversal agent, such as Naloxone (AKA: Narcan).  What is this medication? NALOXONE (nal OX one) treats opioid overdose, which causes slow or shallow breathing, severe drowsiness, or trouble staying awake. Call emergency services after using this medication. You may need additional treatment. Naloxone works by reversing the effects of opioids. It belongs to a group of medications called opioid blockers.  COMMON BRAND NAME(S): Kloxxado, Narcan  What should I tell my care team before  I take this medication? They need to know if you have any of these conditions: Heart disease Substance use disorder An unusual or allergic reaction to naloxone, other medications, foods, dyes, or preservatives Pregnant or trying to get pregnant Breast-feeding  When to use this medication? This medication is to be used for the treatment of respiratory depression (less than 8 breaths per minute) secondary to opioid overdose.   How to use this medication? This medication is for use in the nose. Lay the person on their back. Support their neck with your hand and allow the head to tilt back before giving the medication. The nasal spray should be given into 1 nostril. After giving the medication, move the person onto their side. Do not remove or test the nasal spray until ready to use. Get emergency medical help right away after giving the first dose of this medication, even if the person wakes up. You should be familiar with how to recognize the signs and symptoms of a narcotic overdose. If more doses are needed, give the additional dose in the other nostril. Talk to your care team about the use of this medication in children. While this medication may be prescribed for children as young as newborns for selected conditions, precautions do apply.  Naloxone Overdosage: If you think you have taken too much of this medicine contact a poison control center or emergency room at once.  NOTE: This medicine is only for you. Do not share this medicine with others.  What if I miss a dose? This does not apply.  What may interact with this medication? This is only used during an emergency. No interactions are expected during emergency use. This list may not describe all possible interactions. Give your health care provider a list of all the medicines, herbs, non-prescription drugs, or dietary supplements you use. Also tell them if you smoke, drink alcohol, or use illegal drugs. Some items may interact with  your medicine.  What should I watch for while using this medication? Keep this medication ready for use in the case of an opioid overdose. Make sure that you have the phone number of your care team and local hospital ready. You may need to have additional doses of this medication. Each nasal spray contains a single dose. Some emergencies may require additional doses. After use, bring the treated person to the nearest hospital or call 911. Make sure the treating care team knows that the person has received a dose of this medication. You will receive additional instructions on what to do during and after use of this  medication before an emergency occurs.  What side effects may I notice from receiving this medication? Side effects that you should report to your care team as soon as possible: Allergic reactions--skin rash, itching, hives, swelling of the face, lips, tongue, or throat Side effects that usually do not require medical attention (report these to your care team if they continue or are bothersome): Constipation Dryness or irritation inside the nose Headache Increase in blood pressure Muscle spasms Stuffy nose Toothache This list may not describe all possible side effects. Call your doctor for medical advice about side effects. You may report side effects to FDA at 1-800-FDA-1088.  Where should I keep my medication? Because this is an emergency medication, you should keep it with you at all times.  Keep out of the reach of children and pets. Store between 20 and 25 degrees C (68 and 77 degrees F). Do not freeze. Throw away any unused medication after the expiration date. Keep in original box until ready to use.  NOTE: This sheet is a summary. It may not cover all possible information. If you have questions about this medicine, talk to your doctor, pharmacist, or health care provider.   2023 Elsevier/Gold Standard (2021-05-14  00:00:00)  ____________________________________________________________________________________________

## 2022-12-14 NOTE — Progress Notes (Signed)
Safety precautions to be maintained throughout the outpatient stay will include: orient to surroundings, keep bed in low position, maintain call bell within reach at all times, provide assistance with transfer out of bed and ambulation.   Nursing Pain Medication Assessment:  Safety precautions to be maintained throughout the outpatient stay will include: orient to surroundings, keep bed in low position, maintain call bell within reach at all times, provide assistance with transfer out of bed and ambulation.  Medication Inspection Compliance: Ms. Pozo did not comply with our request to bring her pills to be counted. She was reminded that bringing the medication bottles, even when empty, is a requirement.  Medication: None brought in. Pill/Patch Count: None available to be counted. Bottle Appearance: No container available. Did not bring bottle(s) to appointment. Filled Date: N/A Last Medication intake:  Today

## 2022-12-23 ENCOUNTER — Other Ambulatory Visit: Payer: Self-pay | Admitting: Internal Medicine

## 2022-12-23 DIAGNOSIS — F064 Anxiety disorder due to known physiological condition: Secondary | ICD-10-CM

## 2022-12-26 NOTE — Telephone Encounter (Signed)
Requested medication (s) are due for refill today: yes  Requested medication (s) are on the active medication list: yes  Last refill:  06/06/22  Future visit scheduled: yes  Notes to clinic:  Unable to refill per protocol, cannot delegate.      Requested Prescriptions  Pending Prescriptions Disp Refills   ALPRAZolam (XANAX) 0.5 MG tablet [Pharmacy Med Name: ALPRAZOLAM 0.5 MG TABLET] 60 tablet     Sig: TAKE 1 TABLET BY MOUTH TWICE A DAY AS NEEDED FOR ANXIETY     Not Delegated - Psychiatry: Anxiolytics/Hypnotics 2 Failed - 12/23/2022  6:28 PM      Failed - This refill cannot be delegated      Failed - Urine Drug Screen completed in last 360 days      Passed - Patient is not pregnant      Passed - Valid encounter within last 6 months    Recent Outpatient Visits           3 weeks ago Primary osteoarthritis of left knee   [place] [facility] at [facility] Ashley Royalty, Ocie Bob, MD   1 month ago Chronic pain of left knee   [facility] at [facility], Nyoka Cowden, MD   11 months ago Annual physical exam   [facility] at [facility], Nyoka Cowden, MD   1 year ago Annual physical exam   [facility] Medicine at [facility], Nyoka Cowden, MD   2 years ago Annual physical exam   [facility] Medicine at [facility], Nyoka Cowden, MD       Future Appointments             Tomorrow Ashley Royalty, Ocie Bob, MD [facility] & [facility] Medicine at [facility], [facility]   In 2 weeks Judithann Graves, Nyoka Cowden, MD [facility] Primary Care & Sports Medicine at [facility], [facility]

## 2022-12-27 ENCOUNTER — Encounter: Payer: Self-pay | Admitting: Family Medicine

## 2022-12-27 ENCOUNTER — Ambulatory Visit: Payer: Commercial Managed Care - PPO | Admitting: Family Medicine

## 2022-12-27 VITALS — HR 90 | Ht 67.0 in | Wt 160.0 lb

## 2022-12-27 DIAGNOSIS — M7582 Other shoulder lesions, left shoulder: Secondary | ICD-10-CM | POA: Diagnosis not present

## 2022-12-27 DIAGNOSIS — M1712 Unilateral primary osteoarthritis, left knee: Secondary | ICD-10-CM

## 2022-12-27 MED ORDER — MELOXICAM 15 MG PO TABS: 15.0000 mg | ORAL_TABLET | Freq: Every day | ORAL | 0 refills | Status: DC

## 2022-12-27 NOTE — Progress Notes (Signed)
     Primary Care / Sports Medicine Office Visit  Patient Information:  Patient ID: Wendy Snyder, female DOB: 01-15-1966 Age: 57 y.o. MRN: [id number]   Wendy Snyder is a pleasant 57 y.o. female presenting with the following:  Chief Complaint  Patient presents with   Primary osteoarthritis of left knee    Left, same, aspercreme is only thing that helps, doesn't feel stable   Shoulder Pain    Left, feels like popping, keeps her awake at night    Vitals:   12/27/22 1600  Pulse: 90  SpO2: 98%   Vitals:   12/27/22 1600  Weight: 160 lb (72.6 kg)  Height: 5\' 7"  (1.702 m)   Body mass index is 25.06 kg/m.  No results found.   Independent interpretation of notes and tests performed by another provider:   None  Procedures performed:   None  Pertinent History, Exam, Impression, and Recommendations:   [name] was seen today for primary osteoarthritis of left knee and shoulder pain.  Primary osteoarthritis of left knee Assessment & Plan: Returns for follow-up to left knee osteoarthritis, never received meloxicam but did start home exercises with subtle improvement.  Still noting pain with fully extended leg, ADLs, some instability related.  Examination with patellar facet tenderness, maximal pain during passive extension and flexion, limited at 130 degrees due to pain, otherwise examination nonfocal.  Given persistent symptoms we discussed treatment such as advanced to corticosteroid, revisit meloxicam, plan as follows: - Patient will dose meloxicam daily x 2-4 weeks - Continue home exercise with additional materials provided today - Contact our office in 2 to 4 weeks if symptoms persist at which point we will consider return visit and consideration of intra-articular cortisone injection - Otherwise follow-up as needed   Rotator cuff tendinitis, left Assessment & Plan: Right-hand-dominant patient presenting with atraumatic left shoulder pain in the setting  of increased activity (gardening).  Pain does not radiate, anterolateral in location.  Aggravated with reaching activities, somewhat addressed with Aspercreme, no overt weakness.  Examination with full, painful range of motion, RC testing 5/5, painful during external rotation maximally during empty can test, positive Neer's, positive Hawkins, tenderness at the coracoid, negative speeds, Yergason's, remainder of examination benign.  Finding most consistent with rotator cuff based tendinitis with focality to supraspinatus and external rotators, plan as follows: - Dose meloxicam daily x 2-4 weeks - Start home-based rehab - Recalcitrant symptoms to be conveyed via MyChart/phone call to our office, and office visit for next steps inclusive of consideration of corticosteroid injection - Otherwise follow-up as needed   Other orders -     Meloxicam; Take 1 tablet (15 mg total) by mouth daily.  Dispense: 30 tablet; Refill: 0     Orders & Medications Meds ordered this encounter  Medications   meloxicam (MOBIC) 15 MG tablet    Sig: Take 1 tablet (15 mg total) by mouth daily.    Dispense:  30 tablet    Refill:  0   No orders of the defined types were placed in this encounter.    Return if symptoms worsen or fail to improve.     [name], MD, [place]   Primary Care Sports Medicine Primary Care and Sports Medicine at [facility]

## 2022-12-27 NOTE — Assessment & Plan Note (Signed)
Right-hand-dominant patient presenting with atraumatic left shoulder pain in the setting of increased activity (gardening).  Pain does not radiate, anterolateral in location.  Aggravated with reaching activities, somewhat addressed with Aspercreme, no overt weakness.  Examination with full, painful range of motion, RC testing 5/5, painful during external rotation maximally during empty can test, positive Neer's, positive Hawkins, tenderness at the coracoid, negative speeds, Yergason's, remainder of examination benign.  Finding most consistent with rotator cuff based tendinitis with focality to supraspinatus and external rotators, plan as follows: - Dose meloxicam daily x 2-4 weeks - Start home-based rehab - Recalcitrant symptoms to be conveyed via MyChart/phone call to our office, and office visit for next steps inclusive of consideration of corticosteroid injection - Otherwise follow-up as needed

## 2022-12-27 NOTE — Assessment & Plan Note (Signed)
Returns for follow-up to left knee osteoarthritis, never received meloxicam but did start home exercises with subtle improvement.  Still noting pain with fully extended leg, ADLs, some instability related.  Examination with patellar facet tenderness, maximal pain during passive extension and flexion, limited at 130 degrees due to pain, otherwise examination nonfocal.  Given persistent symptoms we discussed treatment such as advanced to corticosteroid, revisit meloxicam, plan as follows: - Patient will dose meloxicam daily x 2-4 weeks - Continue home exercise with additional materials provided today - Contact our office in 2 to 4 weeks if symptoms persist at which point we will consider return visit and consideration of intra-articular cortisone injection - Otherwise follow-up as needed

## 2022-12-27 NOTE — Patient Instructions (Addendum)
-   Take meloxicam daily x 2-4 weeks with food - Start home exercises for shoulder and continue them for the left knee with new information - Contact us at 2-4 weeks for any persistent symptoms, questions, or concerns

## 2023-01-01 ENCOUNTER — Other Ambulatory Visit: Payer: Self-pay | Admitting: Internal Medicine

## 2023-01-01 DIAGNOSIS — F064 Anxiety disorder due to known physiological condition: Secondary | ICD-10-CM

## 2023-01-12 ENCOUNTER — Encounter: Payer: Commercial Managed Care - PPO | Admitting: Internal Medicine

## 2023-01-12 NOTE — Assessment & Plan Note (Deleted)
Clinically stable on current regimen with good control of symptoms, No SI or HI. No change in management at this time. Continue Effexor and Xanax
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
## 2023-01-12 NOTE — Assessment & Plan Note (Deleted)
supplemented

## 2023-01-13 ENCOUNTER — Encounter: Payer: Self-pay | Admitting: Internal Medicine

## 2023-01-13 ENCOUNTER — Ambulatory Visit (INDEPENDENT_AMBULATORY_CARE_PROVIDER_SITE_OTHER): Payer: Commercial Managed Care - PPO | Admitting: Internal Medicine

## 2023-01-13 VITALS — BP 110/68 | HR 72 | Ht 67.0 in | Wt 159.0 lb

## 2023-01-13 DIAGNOSIS — Z1231 Encounter for screening mammogram for malignant neoplasm of breast: Secondary | ICD-10-CM

## 2023-01-13 DIAGNOSIS — Z131 Encounter for screening for diabetes mellitus: Secondary | ICD-10-CM

## 2023-01-13 DIAGNOSIS — F3341 Major depressive disorder, recurrent, in partial remission: Secondary | ICD-10-CM

## 2023-01-13 DIAGNOSIS — Z13220 Encounter for screening for lipoid disorders: Secondary | ICD-10-CM

## 2023-01-13 DIAGNOSIS — F064 Anxiety disorder due to known physiological condition: Secondary | ICD-10-CM

## 2023-01-13 DIAGNOSIS — Z1211 Encounter for screening for malignant neoplasm of colon: Secondary | ICD-10-CM

## 2023-01-13 DIAGNOSIS — Z0000 Encounter for general adult medical examination without abnormal findings: Secondary | ICD-10-CM | POA: Diagnosis not present

## 2023-01-13 DIAGNOSIS — E034 Atrophy of thyroid (acquired): Secondary | ICD-10-CM | POA: Diagnosis not present

## 2023-01-13 MED ORDER — ALPRAZOLAM 0.5 MG PO TABS: ORAL_TABLET | ORAL | 5 refills | Status: DC
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
## 2023-01-13 NOTE — Patient Instructions (Addendum)
Call [facility] to schedule your mammogram at [phone].   -It was a pleasure to see you today! Please review your visit summary for helpful information. -Lab results are usually available within 1-2 days and we will call once reviewed. -I would encourage you to follow your care via MyChart where you can access lab results, notes, messages, and more. -If you feel that we did a nice job today, please complete your after-visit survey and leave us a Google review! Your CMA today was Chassidy and your provider was Dr Lambros Cerro, MD.  

## 2023-01-13 NOTE — Assessment & Plan Note (Signed)
Clinically stable on current regimen with good control of symptoms, No SI or HI. No change in management at this time. Effexor and Xanax bid prn

## 2023-01-13 NOTE — Assessment & Plan Note (Signed)
supplemented

## 2023-01-13 NOTE — Assessment & Plan Note (Signed)
Clinically stable on current regimen with good control of symptoms, No SI or HI. No change in management at this time. Continue Effexor and Xanax 

## 2023-01-14 LAB — CBC WITH DIFFERENTIAL/PLATELET
Basophils Absolute: 0.1 10*3/uL (ref 0.0–0.2)
Basos: 1 %
EOS (ABSOLUTE): 0.2 10*3/uL (ref 0.0–0.4)
Eos: 4 %
Hematocrit: 39.1 % (ref 34.0–46.6)
Hemoglobin: 13.3 g/dL (ref 11.1–15.9)
Immature Grans (Abs): 0 10*3/uL (ref 0.0–0.1)
Immature Granulocytes: 0 %
Lymphocytes Absolute: 1.2 10*3/uL (ref 0.7–3.1)
Lymphs: 28 %
MCH: 30.6 pg (ref 26.6–33.0)
MCHC: 34 g/dL (ref 31.5–35.7)
MCV: 90 fL (ref 79–97)
Monocytes Absolute: 0.4 10*3/uL (ref 0.1–0.9)
Monocytes: 9 %
Neutrophils Absolute: 2.5 10*3/uL (ref 1.4–7.0)
Neutrophils: 58 %
Platelets: 202 10*3/uL (ref 150–450)
RBC: 4.34 x10E6/uL (ref 3.77–5.28)
RDW: 12.2 % (ref 11.7–15.4)
WBC: 4.3 10*3/uL (ref 3.4–10.8)

## 2023-01-14 LAB — COMPREHENSIVE METABOLIC PANEL
ALT: 16 IU/L (ref 0–32)
AST: 23 IU/L (ref 0–40)
Albumin/Globulin Ratio: 2.5 — ABNORMAL HIGH (ref 1.2–2.2)
Albumin: 4.8 g/dL (ref 3.8–4.9)
Alkaline Phosphatase: 58 IU/L (ref 44–121)
BUN/Creatinine Ratio: 15 (ref 9–23)
BUN: 12 mg/dL (ref 6–24)
Bilirubin Total: 0.4 mg/dL (ref 0.0–1.2)
CO2: 19 mmol/L — ABNORMAL LOW (ref 20–29)
Calcium: 10.5 mg/dL — ABNORMAL HIGH (ref 8.7–10.2)
Chloride: 104 mmol/L (ref 96–106)
Creatinine, Ser: 0.82 mg/dL (ref 0.57–1.00)
Globulin, Total: 1.9 g/dL (ref 1.5–4.5)
Glucose: 80 mg/dL (ref 70–99)
Potassium: 4 mmol/L (ref 3.5–5.2)
Sodium: 144 mmol/L (ref 134–144)
Total Protein: 6.7 g/dL (ref 6.0–8.5)
eGFR: 84 mL/min/{1.73_m2} (ref >59)

## 2023-01-14 LAB — HEMOGLOBIN A1C
Est. average glucose Bld gHb Est-mCnc: 105 mg/dL
Hgb A1c MFr Bld: 5.3 % (ref 4.8–5.6)

## 2023-01-14 LAB — TSH+FREE T4
Free T4: 1.4 ng/dL (ref 0.82–1.77)
TSH: 1.93 u[IU]/mL (ref 0.450–4.500)

## 2023-01-14 LAB — LIPID PANEL
Chol/HDL Ratio: 1.8 ratio (ref 0.0–4.4)
Cholesterol, Total: 196 mg/dL (ref 100–199)
HDL: 107 mg/dL (ref >39)
LDL Chol Calc (NIH): 76 mg/dL (ref 0–99)
Triglycerides: 69 mg/dL (ref 0–149)
VLDL Cholesterol Cal: 13 mg/dL (ref 5–40)

## 2023-01-17 NOTE — Progress Notes (Unsigned)
PROVIDER NOTE: Information contained herein reflects review and annotations entered in association with encounter. Interpretation of such information and data should be left to medically-trained personnel. Information provided to patient can be located elsewhere in the medical record under "Patient Instructions". Document created using STT-dictation technology, any transcriptional errors that may result from process are unintentional.    Patient: Wendy Snyder  Service Category: E/M  Provider: Oswaldo Done, MD  DOB: Feb 12, 1966  DOS: 01/18/2023  Referring Provider: Reubin Milan, MD  MRN: [id number]  Specialty: Interventional Pain Management  PCP: Reubin Milan, MD  Type: Established Patient  Setting: Ambulatory outpatient    Location: Office  Delivery: Face-to-face     HPI  Ms. MADY OUBRE, a 57 y.o. year old female, is here today because of her No primary diagnosis found.. Ms. Burel primary complain today is No chief complaint on file.  Pertinent problems: Ms. Bergman has Chronic pain syndrome; Presence of functional implant (Rechargable Medtronic Neurostimulator) (Cervical Epidural Leads); Spinal cord stimulator in situ (cervical leads); Chronic upper extremity pain (Right); RSD (reflex sympathetic dystrophy) (right upper extremity); Chronic low back pain (1ry area of Pain) (Bilateral) (R>L) w/o sciatica; Chronic hip pain (2ry area of Pain) (Right); Chronic wrist pain (3ry area of Pain) (Right) (CRPS); Chronic shoulder pain (Right); CRPS (complex regional pain syndrome), type I, upper (Right); Chronic pain of left knee; and Primary osteoarthritis of left knee on their pertinent problem list. Pain Assessment: Severity of   is reported as a  /10. Location:    / . Onset:  . Quality:  . Timing:  . Modifying factor(s):  Marland Kitchen Vitals:  vitals were not taken for this visit.  BMI: Estimated body mass index is 24.9 kg/m as calculated from the following:   Height as of 01/13/23: 5'  7" (1.702 m).   Weight as of 01/13/23: 159 lb (72.1 kg). Last encounter: 12/14/2022. Last procedure: Visit date not found.  Reason for encounter: medication management. ***  Pharmacotherapy Assessment  Analgesic: Hydrocodone/APAP 10/325, 1 tab PO q 4 hrs (60 mg/day of hydrocodone) MME/day: 60 mg/day.   Monitoring: [place] PMP: PDMP reviewed during this encounter.       Pharmacotherapy: No side-effects or adverse reactions reported. Compliance: No problems identified. Effectiveness: Clinically acceptable.  No notes on file  No results found for: "CBDTHCR" No results found for: "D8THCCBX" No results found for: "D9THCCBX"  UDS:  Summary  Date Value Ref Range Status  03/16/2022 Note  Final    Comment:    ==================================================================== [employer] Select 13 (MW) ==================================================================== Test                             Result       Flag       Units  Drug Present and Declared for Prescription Verification   Hydrocodone                    1469         EXPECTED   ng/mg creat   Hydromorphone                  359          EXPECTED   ng/mg creat   Dihydrocodeine                 233          EXPECTED   ng/mg creat   Norhydrocodone                 >  5376        EXPECTED   ng/mg creat    Sources of hydrocodone include scheduled prescription medications.    Hydromorphone, dihydrocodeine and norhydrocodone are expected    metabolites of hydrocodone. Hydromorphone and dihydrocodeine are    also available as scheduled prescription medications.  Drug Absent but Declared for Prescription Verification   Alprazolam                     Not Detected UNEXPECTED ng/mg creat ==================================================================== Test                      Result    Flag   Units      Ref Range   Creatinine              93               mg/dL       >=16 ==================================================================== Declared Medications:  The flagging and interpretation on this report are based on the  following declared medications.  Unexpected results may arise from  inaccuracies in the declared medications.   **Note: The testing scope of this panel includes these medications:   Alprazolam (Xanax)  Hydrocodone (Norco)   **Note: The testing scope of this panel does not include the  following reported medications:   Acetaminophen (Norco)  Acyclovir (Zovirax)  Levothyroxine (Synthroid)  Multivitamin  Naloxone (Narcan)  Naproxen (Aleve)  Triamcinolone (Kenalog)  Valacyclovir (Valtrex)  Venlafaxine (Effexor)  Vitamin D3 ==================================================================== For clinical consultation, please call [phone]. ====================================================================       ROS  Constitutional: Denies any fever or chills Gastrointestinal: No reported hemesis, hematochezia, vomiting, or acute GI distress Musculoskeletal: Denies any acute onset joint swelling, redness, loss of ROM, or weakness Neurological: No reported episodes of acute onset apraxia, aphasia, dysarthria, agnosia, amnesia, paralysis, loss of coordination, or loss of consciousness  Medication Review  ALPRAZolam, HYDROcodone-acetaminophen, Vitamin D3, acyclovir ointment, levothyroxine, meloxicam, multivitamin, naloxone, naproxen sodium, triamcinolone cream, valACYclovir, and venlafaxine XR  History Review  Allergy: Ms. Lahaie is allergic to phenergan [promethazine hcl]. Drug: Ms. Harroun  reports no history of drug use. Alcohol:  reports no history of alcohol use. Tobacco:  reports that she has never smoked. She has never used smokeless tobacco. Social: Ms. Montero  reports that she has never smoked. She has never used smokeless tobacco. She reports that she does not drink alcohol and does not use  drugs. Medical:  has a past medical history of Depression, Hypothyroidism, and Thyroid disease. Surgical: Ms. Rendall  has a past surgical history that includes Spinal cord stimulator implant (2003); Tubal ligation (1990); Fracture surgery (Right); and Breast cyst excision (Left). Family: family history includes Breast cancer in her maternal grandmother; Breast cancer (age of onset: 33) in her mother; Depression in her mother. She was adopted.  Laboratory Chemistry Profile   Renal Lab Results  Component Value Date   BUN 12 01/13/2023   CREATININE 0.82 01/13/2023   BCR 15 01/13/2023   GFRAA 88 12/10/2019   GFRNONAA 76 12/10/2019    Hepatic Lab Results  Component Value Date   AST 23 01/13/2023   ALT 16 01/13/2023   ALBUMIN 4.8 01/13/2023   ALKPHOS 58 01/13/2023    Electrolytes Lab Results  Component Value Date   NA 144 01/13/2023   K 4.0 01/13/2023   CL 104 01/13/2023   CALCIUM 10.5 (H) 01/13/2023   MG 2.1 12/10/2019    Bone Lab Results  Component Value Date   25OHVITD1 84 12/10/2019   25OHVITD2 <1.0 12/10/2019   25OHVITD3 84 12/10/2019    Inflammation (CRP: Acute Phase) (ESR: Chronic Phase) Lab Results  Component Value Date   CRP <1 12/10/2019   ESRSEDRATE 2 12/10/2019         Note: Above Lab results reviewed.  Recent Imaging Review  DG Knee Complete 4 Views Left CLINICAL DATA:  Lateral knee pain  EXAM: LEFT KNEE - COMPLETE 4+ VIEW  COMPARISON:  None Available.  FINDINGS: Mild patellofemoral and medial compartment degenerative change. No fracture, dislocation, joint effusion, or other abnormality.  IMPRESSION: Mild patellofemoral and medial compartment degenerative change.  Electronically Signed   By: Gerome Sam III M.D.   On: 11/17/2022 17:27 Note: Reviewed        Physical Exam  General appearance: Well nourished, well developed, and well hydrated. In no apparent acute distress Mental status: Alert, oriented x 3 (person, place, & time)        Respiratory: No evidence of acute respiratory distress Eyes: PERLA Vitals: LMP 10/13/2016 (Within Days)  BMI: Estimated body mass index is 24.9 kg/m as calculated from the following:   Height as of 01/13/23: 5\' 7"  (1.702 m).   Weight as of 01/13/23: 159 lb (72.1 kg). Ideal: Ideal body weight: 61.6 kg (135 lb 12.9 oz) Adjusted ideal body weight: 65.8 kg (145 lb 1.3 oz)  Assessment   Diagnosis Status  No diagnosis found. Controlled Controlled Controlled   Updated Problems: No problems updated.  Plan of Care  Problem-specific:  No problem-specific Assessment & Plan notes found for this encounter.  Ms. VAUDA SALVUCCI has a current medication list which includes the following long-term medication(s): hydrocodone-acetaminophen, levothyroxine, naloxone, and venlafaxine xr.  Pharmacotherapy (Medications Ordered): No orders of the defined types were placed in this encounter.  Orders:  No orders of the defined types were placed in this encounter.  Follow-up plan:   No follow-ups on file.      Interventional Therapies  Risk  Complexity Considerations:   Estimated body mass index is 21.93 kg/m as calculated from the following:   Height as of this encounter: 5\' 7"  (1.702 m).   Weight as of this encounter: 140 lb (63.5 kg). WNL   Planned  Pending:      Under consideration:   Diagnostic bilateral lumbar facet block  Diagnostic IA right hip injection    Completed:   None since before 2020.   Therapeutic  Palliative (PRN) options:   Palliative right stellate ganglion block  Management of Cervical spinal cord stimulator        Recent Visits Date Type Provider Dept  12/14/22 Office Visit Delano Metz, MD [facility]  Showing recent visits within past 90 days and meeting all other requirements Future Appointments Date Type Provider Dept  01/18/23 Appointment Delano Metz, MD [facility]  Showing future appointments within next 90  days and meeting all other requirements  I discussed the assessment and treatment plan with the patient. The patient was provided an opportunity to ask questions and all were answered. The patient agreed with the plan and demonstrated an understanding of the instructions.  Patient advised to call back or seek an in-person evaluation if the symptoms or condition worsens.  Duration of encounter: *** minutes.  Total time on encounter, as per AMA guidelines included both the face-to-face and non-face-to-face time personally spent by the physician and/or other qualified health care professional(s) on the day of the encounter (  includes time in activities that require the physician or other qualified health care professional and does not include time in activities normally performed by clinical staff). Physician's time may include the following activities when performed: Preparing to see the patient (e.g., pre-charting review of records, searching for previously ordered imaging, lab work, and nerve conduction tests) Review of prior analgesic pharmacotherapies. Reviewing PMP Interpreting ordered tests (e.g., lab work, imaging, nerve conduction tests) Performing post-procedure evaluations, including interpretation of diagnostic procedures Obtaining and/or reviewing separately obtained history Performing a medically appropriate examination and/or evaluation Counseling and educating the patient/family/caregiver Ordering medications, tests, or procedures Referring and communicating with other health care professionals (when not separately reported) Documenting clinical information in the electronic or other health record Independently interpreting results (not separately reported) and communicating results to the patient/ family/caregiver Care coordination (not separately reported)  Note by: Oswaldo Done, MD Date: 01/18/2023; Time: 7:45 AM

## 2023-01-18 ENCOUNTER — Ambulatory Visit: Payer: Commercial Managed Care - PPO | Attending: Pain Medicine | Admitting: Pain Medicine

## 2023-01-18 ENCOUNTER — Encounter: Payer: Self-pay | Admitting: Pain Medicine

## 2023-01-18 DIAGNOSIS — M545 Low back pain, unspecified: Secondary | ICD-10-CM | POA: Insufficient documentation

## 2023-01-18 DIAGNOSIS — G8929 Other chronic pain: Secondary | ICD-10-CM | POA: Diagnosis present

## 2023-01-18 DIAGNOSIS — G90511 Complex regional pain syndrome I of right upper limb: Secondary | ICD-10-CM | POA: Diagnosis present

## 2023-01-18 DIAGNOSIS — Z79891 Long term (current) use of opiate analgesic: Secondary | ICD-10-CM | POA: Diagnosis present

## 2023-01-18 DIAGNOSIS — M25531 Pain in right wrist: Secondary | ICD-10-CM

## 2023-01-18 DIAGNOSIS — Z79899 Other long term (current) drug therapy: Secondary | ICD-10-CM | POA: Diagnosis present

## 2023-01-18 DIAGNOSIS — G894 Chronic pain syndrome: Secondary | ICD-10-CM

## 2023-01-18 DIAGNOSIS — M25551 Pain in right hip: Secondary | ICD-10-CM | POA: Diagnosis present

## 2023-01-18 MED ORDER — HYDROCODONE-ACETAMINOPHEN 10-325 MG PO TABS: 1.0000 | ORAL_TABLET | ORAL | 0 refills | Status: DC | PRN

## 2023-01-18 MED ORDER — NALOXONE HCL 4 MG/0.1ML NA LIQD
1.0000 | NASAL | 0 refills | Status: DC | PRN
Start: 2023-01-18 — End: 2024-05-21

## 2023-01-18 NOTE — Patient Instructions (Signed)
____________________________________________________________________________________________  Opioid Pain Medication Update  To: All patients taking opioid pain medications. (I.e.: hydrocodone, hydromorphone, oxycodone, oxymorphone, morphine, codeine, methadone, tapentadol, tramadol, buprenorphine, fentanyl, etc.)  Re: Updated review of side effects and adverse reactions of opioid analgesics, as well as new information about long term effects of this class of medications.  Direct risks of long-term opioid therapy are not limited to opioid addiction and overdose. Potential medical risks include serious fractures, breathing problems during sleep, hyperalgesia, immunosuppression, chronic constipation, bowel obstruction, myocardial infarction, and tooth decay secondary to xerostomia.  Unpredictable adverse effects that can occur even if you take your medication correctly: Cognitive impairment, respiratory depression, and death. Most people think that if they take their medication "correctly", and "as instructed", that they will be safe. Nothing could be farther from the truth. In reality, a significant amount of recorded deaths associated with the use of opioids has occurred in individuals that had taken the medication for a long time, and were taking their medication correctly. The following are examples of how this can happen: Patient taking his/her medication for a long time, as instructed, without any side effects, is given a certain antibiotic or another unrelated medication, which in turn triggers a "Drug-to-drug interaction" leading to disorientation, cognitive impairment, impaired reflexes, respiratory depression or an untoward event leading to serious bodily harm or injury, including death.  Patient taking his/her medication for a long time, as instructed, without any side effects, develops an acute impairment of liver and/or kidney function. This will lead to a rapid inability of the body to  breakdown and eliminate their pain medication, which will result in effects similar to an "overdose", but with the same medicine and dose that they had always taken. This again may lead to disorientation, cognitive impairment, impaired reflexes, respiratory depression or an untoward event leading to serious bodily harm or injury, including death.  A similar problem will occur with patients as they grow older and their liver and kidney function begins to decrease as part of the aging process.  Background information: Historically, the original case for using long-term opioid therapy to treat chronic noncancer pain was based on safety assumptions that subsequent experience has called into question. In 1996, the American Pain Society and the American Academy of Pain Medicine issued a consensus statement supporting long-term opioid therapy. This statement acknowledged the dangers of opioid prescribing but concluded that the risk for addiction was low; respiratory depression induced by opioids was short-lived, occurred mainly in opioid-naive patients, and was antagonized by pain; tolerance was not a common problem; and efforts to control diversion should not constrain opioid prescribing. This has now proven to be wrong. Experience regarding the risks for opioid addiction, misuse, and overdose in community practice has failed to support these assumptions.  According to the Centers for Disease Control and Prevention, fatal overdoses involving opioid analgesics have increased sharply over the past decade. Currently, more than 96,700 people die from drug overdoses every year. Opioids are a factor in 7 out of every 10 overdose deaths. Deaths from drug overdose have surpassed motor vehicle accidents as the leading cause of death for individuals between the ages of 35 and 54.  Clinical data suggest that neuroendocrine dysfunction may be very common in both men and women, potentially causing hypogonadism, erectile  dysfunction, infertility, decreased libido, osteoporosis, and depression. Recent studies linked higher opioid dose to increased opioid-related mortality. Controlled observational studies reported that long-term opioid therapy may be associated with increased risk for cardiovascular events. Subsequent meta-analysis concluded   that the safety of long-term opioid therapy in elderly patients has not been proven.   Side Effects and adverse reactions: Common side effects: Drowsiness (sedation). Dizziness. Nausea and vomiting. Constipation. Physical dependence -- Dependence often manifests with withdrawal symptoms when opioids are discontinued or decreased. Tolerance -- As you take repeated doses of opioids, you require increased medication to experience the same effect of pain relief. Respiratory depression -- This can occur in healthy people, especially with higher doses. However, people with COPD, asthma or other lung conditions may be even more susceptible to fatal respiratory impairment.  Uncommon side effects: An increased sensitivity to feeling pain and extreme response to pain (hyperalgesia). Chronic use of opioids can lead to this. Delayed gastric emptying (the process by which the contents of your stomach are moved into your small intestine). Muscle rigidity. Immune system and hormonal dysfunction. Quick, involuntary muscle jerks (myoclonus). Arrhythmia. Itchy skin (pruritus). Dry mouth (xerostomia).  Long-term side effects: Chronic constipation. Sleep-disordered breathing (SDB). Increased risk of bone fractures. Hypothalamic-pituitary-adrenal dysregulation. Increased risk of overdose.  RISKS: Fractures and Falls:  Opioids increase the risk and incidence of falls. This is of particular importance in elderly patients.  Endocrine System:  Long-term administration is associated with endocrine abnormalities (endocrinopathies). (Also known as Opioid-induced Endocrinopathy) Influences  on both the hypothalamic-pituitary-adrenal axis?and the hypothalamic-pituitary-gonadal axis have been demonstrated with consequent hypogonadism and adrenal insufficiency in both sexes. Hypogonadism and decreased levels of dehydroepiandrosterone sulfate have been reported in men and women. Endocrine effects include: Amenorrhoea in women (abnormal absence of menstruation) Reduced libido in both sexes Decreased sexual function Erectile dysfunction in men Hypogonadisms (decreased testicular function with shrinkage of testicles) Infertility Depression and fatigue Loss of muscle mass Anxiety Depression Immune suppression Hyperalgesia Weight gain Anemia Osteoporosis Patients (particularly women of childbearing age) should avoid opioids. There is insufficient evidence to recommend routine monitoring of asymptomatic patients taking opioids in the long-term for hormonal deficiencies.  Immune System: Human studies have demonstrated that opioids have an immunomodulating effect. These effects are mediated via opioid receptors both on immune effector cells and in the central nervous system. Opioids have been demonstrated to have adverse effects on antimicrobial response and anti-tumour surveillance. Buprenorphine has been demonstrated to have no impact on immune function.  Opioid Induced Hyperalgesia: Human studies have demonstrated that prolonged use of opioids can lead to a state of abnormal pain sensitivity, sometimes called opioid induced hyperalgesia (OIH). Opioid induced hyperalgesia is not usually seen in the absence of tolerance to opioid analgesia. Clinically, hyperalgesia may be diagnosed if the patient on long-term opioid therapy presents with increased pain. This might be qualitatively and anatomically distinct from pain related to disease progression or to breakthrough pain resulting from development of opioid tolerance. Pain associated with hyperalgesia tends to be more diffuse than the  pre-existing pain and less defined in quality. Management of opioid induced hyperalgesia requires opioid dose reduction.  Cancer: Chronic opioid therapy has been associated with an increased risk of cancer among noncancer patients with chronic pain. This association was more evident in chronic strong opioid users. Chronic opioid consumption causes significant pathological changes in the small intestine and colon. Epidemiological studies have found that there is a link between opium dependence and initiation of gastrointestinal cancers. Cancer is the second leading cause of death after cardiovascular disease. Chronic use of opioids can cause multiple conditions such as GERD, immunosuppression and renal damage as well as carcinogenic effects, which are associated with the incidence of cancers.   Mortality: Long-term opioid use   has been associated with increased mortality among patients with chronic non-cancer pain (CNCP).  Prescription of long-acting opioids for chronic noncancer pain was associated with a significantly increased risk of all-cause mortality, including deaths from causes other than overdose.  Reference: Von Korff M, Kolodny A, Deyo RA, Chou R. Long-term opioid therapy reconsidered. Ann Intern Med. 2011 Sep 6;155(5):325-8. doi: 10.7326/[id number]. PMID: 21893626; PMCID: PMC3280085. Bedson J, Chen Y, Ashworth J, Hayward RA, Dunn KM, Jordan KP. Risk of adverse events in patients prescribed long-term opioids: A cohort study in the [place] Clinical Practice Research Datalink. Eur J Pain. 2019 May;23(5):908-922. doi: 10.1002/ejp.1357. Epub 2019 Jan 31. PMID: 30620116. Colameco S, Coren JS, Ciervo CA. Continuous opioid treatment for chronic noncancer pain: a time for moderation in prescribing. Postgrad Med. 2009 Jul;121(4):61-6. doi: 10.3810/pgm.2009.07.2032. PMID: 19641271. Chou R, Turner JA, Devine EB, Hansen RN, Sullivan SD, Blazina I, Dana T, Bougatsos C, Deyo RA. The  effectiveness and risks of long-term opioid therapy for chronic pain: a systematic review for a National Institutes of Health Pathways to Prevention Workshop. Ann Intern Med. 2015 Feb 17;162(4):276-86. doi: 10.7326/M14-2559. PMID: 25581257. Warner M, Chen LH, Makuc DM. NCHS Data Brief No. 22. [place]: Centers for Disease Control and Prevention; 2009. Sep, Increase in Fatal Poisonings Involving Opioid Analgesics in the [place], 1999-2006. Song IA, Choi HR, Oh TK. Long-term opioid use and mortality in patients with chronic non-cancer pain: Ten-year follow-up study in [place] from 2010 through 2019. EClinicalMedicine. 2022 Jul 18;51:101558. doi: 10.1016/j.eclinm.2022.101558. PMID: 35875817; PMCID: PMC9304910. Huser, W., Schubert, T., Vogelmann, T. et al. All-cause mortality in patients with long-term opioid therapy compared with non-opioid analgesics for chronic non-cancer pain: a database study. BMC Med 18, 162 (2020). https://doi.org/10.1186/[email or web address] Rashidian H, Zendehdel K, Kamangar F, Malekzadeh R, Haghdoost AA. An Ecological Study of the Association between Opiate Use and Incidence of Cancers. Addict Health. 2016 Fall;8(4):252-260. PMID: 28819556; PMCID: PMC5554805.  Our Goal: Our goal is to control your pain with means other than the use of opioid pain medications.  Our Recommendation: Talk to your physician about coming off of these medications. We can assist you with the tapering down and stopping these medicines. Based on the new information, even if you cannot completely stop the medication, a decrease in the dose may be associated with a lesser risk. Ask for other means of controlling the pain. Decrease or eliminate those factors that significantly contribute to your pain such as smoking, obesity, and a diet heavily tilted towards "inflammatory" nutrients.  Last Updated: 11/16/2022    ____________________________________________________________________________________________     ____________________________________________________________________________________________  Patient Information update  To: All of our patients.  Re: Name change.  It has been made official that our current name, "[facility]"   will soon be changed to "[facility]".   The purpose of this change is to eliminate any confusion created by the concept of our practice being a "Medication Management Pain Clinic". In the past this has led to the misconception that we treat pain primarily by the use of prescription medications.  Nothing can be farther from the truth.   Understanding PAIN MANAGEMENT: To further understand what our practice does, you first have to understand that "Pain Management" is a subspecialty that requires additional training once a physician has completed their specialty training, which can be in either Anesthesia, Neurology, Psychiatry, or Physical Medicine and Rehabilitation (PMR). Each one of these contributes to the final approach taken by each physician to   the management of their patient's pain. To be a "Pain Management Specialist" you must have first completed one of the specialty trainings below.  Anesthesiologists - trained in clinical pharmacology and interventional techniques such as nerve blockade and regional as well as central neuroanatomy. They are trained to block pain before, during, and after surgical interventions.  Neurologists - trained in the diagnosis and pharmacological treatment of complex neurological conditions, such as Multiple Sclerosis, Parkinson's, spinal cord injuries, and other systemic conditions that may be associated with symptoms that may include but are not limited to pain. They tend to rely primarily on the treatment of chronic pain  using prescription medications.  Psychiatrist - trained in conditions affecting the psychosocial wellbeing of patients including but not limited to depression, anxiety, schizophrenia, personality disorders, addiction, and other substance use disorders that may be associated with chronic pain. They tend to rely primarily on the treatment of chronic pain using prescription medications.   Physical Medicine and Rehabilitation (PMR) physicians, also known as physiatrists - trained to treat a wide variety of medical conditions affecting the brain, spinal cord, nerves, bones, joints, ligaments, muscles, and tendons. Their training is primarily aimed at treating patients that have suffered injuries that have caused severe physical impairment. Their training is primarily aimed at the physical therapy and rehabilitation of those patients. They may also work alongside orthopedic surgeons or neurosurgeons using their expertise in assisting surgical patients to recover after their surgeries.  INTERVENTIONAL PAIN MANAGEMENT is sub-subspecialty of Pain Management.  Our physicians are Board-certified in Anesthesia, Pain Management, and Interventional Pain Management.  This meaning that not only have they been trained and Board-certified in their specialty of Anesthesia, and subspecialty of Pain Management, but they have also received further training in the sub-subspecialty of Interventional Pain Management, in order to become Board-certified as INTERVENTIONAL PAIN MANAGEMENT SPECIALIST.    Mission: Our goal is to use our skills in  INTERVENTIONAL PAIN MANAGEMENT as alternatives to the chronic use of prescription opioid medications for the treatment of pain. To make this more clear, we have changed our name to reflect what we do and offer. We will continue to offer medication management assessment and recommendations, but we will not be taking over any patient's medication  management.  ____________________________________________________________________________________________     ____________________________________________________________________________________________  National Pain Medication Shortage  The [place] is experiencing worsening drug shortages. These have had a negative widespread effect on patient care and treatment. Not expected to improve any time soon. Predicted to last past 2029.   Drug shortage list (generic names) Oxycodone IR Oxycodone/APAP Oxymorphone IR Hydromorphone Hydrocodone/APAP Morphine  Where is the problem?  Manufacturing and supply level.  Will this shortage affect you?  Only if you take any of the above pain medications.  How? You may be unable to fill your prescription.  Your pharmacist may offer a "partial fill" of your prescription. (Warning: Do not accept partial fills.) Prescriptions partially filled cannot be transferred to another pharmacy. Read our Medication Rules and Regulation. Depending on how much medicine you are dependent on, you may experience withdrawals when unable to get the medication.  Recommendations: Consider ending your dependence on opioid pain medications. Ask your pain specialist to assist you with the process. Consider switching to a medication currently not in shortage, such as Buprenorphine. Talk to your pain specialist about this option. Consider decreasing your pain medication requirements by managing tolerance thru "Drug Holidays". This may help minimize withdrawals, should you run out of medicine. Control your pain thru   the use of non-pharmacological interventional therapies.   Your prescriber: Prescribers cannot be blamed for shortages. Medication manufacturing and supply issues cannot be fixed by the prescriber.   NOTE: The prescriber is not responsible for supplying the medication, or solving supply issues. Work with your pharmacist to solve it. The patient is responsible for  the decision to take or continue taking the medication and for identifying and securing a legal supply source. By law, supplying the medication is the job and responsibility of the pharmacy. The prescriber is responsible for the evaluation, monitoring, and prescribing of these medications.   Prescribers will NOT: Re-issue prescriptions that have been partially filled. Re-issue prescriptions already sent to a pharmacy.  Re-send prescriptions to a different pharmacy because yours did not have your medication. Ask pharmacist to order more medicine or transfer the prescription to another pharmacy. (Read below.)  New 2023 regulation: "May 20, 2022 Revised Regulation Allows DEA-Registered Pharmacies to Transfer Electronic Prescriptions at a Patient's Request DEA Headquarters Division - Public Information Office Patients now have the ability to request their electronic prescription be transferred to another pharmacy without having to go back to their practitioner to initiate the request. This revised regulation went into effect on Monday, May 16, 2022.     At a patient's request, a DEA-registered retail pharmacy can now transfer an electronic prescription for a controlled substance (schedules II-V) to another DEA-registered retail pharmacy. Prior to this change, patients would have to go through their practitioner to cancel their prescription and have it re-issued to a different pharmacy. The process was taxing and time consuming for both patients and practitioners.    The Drug Enforcement Administration (DEA) published its intent to revise the process for transferring electronic prescriptions on August 07, 2020.  The final rule was published in the federal register on April 14, 2022 and went into effect 30 days later.  Under the final rule, a prescription can only be transferred once between pharmacies, and only if allowed under existing state or other applicable law. The prescription must  remain in its electronic form; may not be altered in any way; and the transfer must be communicated directly between two licensed pharmacists. It's important to note, any authorized refills transfer with the original prescription, which means the entire prescription will be filled at the same pharmacy".  Reference: https://www.dea.gov/stories/2023/2023-05/2022-09-01/revised-regulation-allows-dea-registered-pharmacies-transfer (DEA website announcement)  https://www.govinfo.gov/content/pkg/FR-2022-04-14/pdf/2023-15847.pdf (Federal Register  Department of Justice)   Federal Register / Vol. 88, No. 143 / Thursday, April 14, 2022 / Rules and Regulations DEPARTMENT OF JUSTICE  Drug Enforcement Administration  21 CFR Part 1306  [Docket No. [id number]]  RIN 1117-AB64 Transfer of Electronic Prescriptions for Schedules II-V Controlled Substances Between Pharmacies for Initial Filling  ____________________________________________________________________________________________     ____________________________________________________________________________________________  Transfer of Pain Medication between Pharmacies  Re: 2023 DEA Clarification on existing regulation  Published on DEA Website: May 20, 2022  Title: Revised Regulation Allows DEA-Registered Pharmacies to Transfer Electronic Prescriptions at a Patient's Request DEA Headquarters Division - Public Information Office  "Patients now have the ability to request their electronic prescription be transferred to another pharmacy without having to go back to their practitioner to initiate the request. This revised regulation went into effect on Monday, May 16, 2022.     At a patient's request, a DEA-registered retail pharmacy can now transfer an electronic prescription for a controlled substance (schedules II-V) to another DEA-registered retail pharmacy. Prior to this change, patients would have to go through their practitioner to  cancel their prescription   and have it re-issued to a different pharmacy. The process was taxing and time consuming for both patients and practitioners.    The Drug Enforcement Administration (DEA) published its intent to revise the process for transferring electronic prescriptions on August 07, 2020.  The final rule was published in the federal register on April 14, 2022 and went into effect 30 days later.  Under the final rule, a prescription can only be transferred once between pharmacies, and only if allowed under existing state or other applicable law. The prescription must remain in its electronic form; may not be altered in any way; and the transfer must be communicated directly between two licensed pharmacists. It's important to note, any authorized refills transfer with the original prescription, which means the entire prescription will be filled at the same pharmacy."    REFERENCES: 1. DEA website announcement https://www.dea.gov/stories/2023/2023-05/2022-09-01/revised-regulation-allows-dea-registered-pharmacies-transfer  2. Department of Justice website  https://www.govinfo.gov/content/pkg/FR-2022-04-14/pdf/2023-15847.pdf  3. DEPARTMENT OF JUSTICE Drug Enforcement Administration 21 CFR Part 1306 [Docket No. [id number]] RIN [id number] "Transfer of Electronic Prescriptions for Schedules II-V Controlled Substances Between Pharmacies for Initial Filling"  ____________________________________________________________________________________________     _______________________________________________________________________  Medication Rules  Purpose: To inform patients, and their family members, of our medication rules and regulations.  Applies to: All patients receiving prescriptions from our practice (written or electronic).  Pharmacy of record: This is the pharmacy where your electronic prescriptions will be sent. Make sure we have the correct one.  Electronic prescriptions: In  compliance with the [place] Strengthen Opioid Misuse Prevention (STOP) Act of 2017 (Session Law 2017-74/H243), effective September 19, 2018, all controlled substances must be electronically prescribed. Written prescriptions, faxing, or calling prescriptions to a pharmacy will no longer be done.  Prescription refills: These will be provided only during in-person appointments. No medications will be renewed without a "face-to-face" evaluation with your provider. Applies to all prescriptions.  NOTE: The following applies primarily to controlled substances (Opioid* Pain Medications).   Type of encounter (visit): For patients receiving controlled substances, face-to-face visits are required. (Not an option and not up to the patient.)  Patient's responsibilities: Pain Pills: Bring all pain pills to every appointment (except for procedure appointments). Pill Bottles: Bring pills in original pharmacy bottle. Bring bottle, even if empty. Always bring the bottle of the most recent fill.  Medication refills: You are responsible for knowing and keeping track of what medications you are taking and when is it that you will need a refill. The day before your appointment: write a list of all prescriptions that need to be refilled. The day of the appointment: give the list to the admitting nurse. Prescriptions will be written only during appointments. No prescriptions will be written on procedure days. If you forget a medication: it will not be "Called in", "Faxed", or "electronically sent". You will need to get another appointment to get these prescribed. No early refills. Do not call asking to have your prescription filled early. Partial  or short prescriptions: Occasionally your pharmacy may not have enough pills to fill your prescription.  NEVER ACCEPT a partial fill or a prescription that is short of the total amount of pills that you were prescribed.  With controlled substances the law allows 72 hours for  the pharmacy to complete the prescription.  If the prescription is not completed within 72 hours, the pharmacist will require a new prescription to be written. This means that you will be short on your medicine and we WILL NOT send another prescription to complete your original   prescription.  Instead, request the pharmacy to send a carrier to a nearby branch to get enough medication to provide you with your full prescription. Prescription Accuracy: You are responsible for carefully inspecting your prescriptions before leaving our office. Have the discharge nurse carefully go over each prescription with you, before taking them home. Make sure that your name is accurately spelled, that your address is correct. Check the name and dose of your medication to make sure it is accurate. Check the number of pills, and the written instructions to make sure they are clear and accurate. Make sure that you are given enough medication to last until your next medication refill appointment. Taking Medication: Take medication as prescribed. When it comes to controlled substances, taking less pills or less frequently than prescribed is permitted and encouraged. Never take more pills than instructed. Never take the medication more frequently than prescribed.  Inform other Doctors: Always inform, all of your healthcare providers, of all the medications you take. Pain Medication from other Providers: You are not allowed to accept any additional pain medication from any other Doctor or Healthcare provider. There are two exceptions to this rule. (see below) In the event that you require additional pain medication, you are responsible for notifying us, as stated below. Cough Medicine: Often these contain an opioid, such as codeine or hydrocodone. Never accept or take cough medicine containing these opioids if you are already taking an opioid* medication. The combination may cause respiratory failure and death. Medication Agreement:  You are responsible for carefully reading and following our Medication Agreement. This must be signed before receiving any prescriptions from our practice. Safely store a copy of your signed Agreement. Violations to the Agreement will result in no further prescriptions. (Additional copies of our Medication Agreement are available upon request.) Laws, Rules, & Regulations: All patients are expected to follow all Federal and State Laws, Statutes, Rules, & Regulations. Ignorance of the Laws does not constitute a valid excuse.  Illegal drugs and Controlled Substances: The use of illegal substances (including, but not limited to marijuana and its derivatives) and/or the illegal use of any controlled substances is strictly prohibited. Violation of this rule may result in the immediate and permanent discontinuation of any and all prescriptions being written by our practice. The use of any illegal substances is prohibited. Adopted CDC guidelines & recommendations: Target dosing levels will be at or below 60 MME/day. Use of benzodiazepines** is not recommended.  Exceptions: There are only two exceptions to the rule of not receiving pain medications from other Healthcare Providers. Exception #1 (Emergencies): In the event of an emergency (i.e.: accident requiring emergency care), you are allowed to receive additional pain medication. However, you are responsible for: As soon as you are able, call our office [phone], at any time of the day or night, and leave a message stating your name, the date and nature of the emergency, and the name and dose of the medication prescribed. In the event that your call is answered by a member of our staff, make sure to document and save the date, time, and the name of the person that took your information.  Exception #2 (Planned Surgery): In the event that you are scheduled by another doctor or dentist to have any type of surgery or procedure, you are allowed (for a period no  longer than 30 days), to receive additional pain medication, for the acute post-op pain. However, in this case, you are responsible for picking up a copy of   our "Post-op Pain Management for Surgeons" handout, and giving it to your surgeon or dentist. This document is available at our office, and does not require an appointment to obtain it. Simply go to our office during business hours (Monday-Thursday from 8:00 AM to 4:00 PM) (Friday 8:00 AM to 12:00 Noon) or if you have a scheduled appointment with us, prior to your surgery, and ask for it by name. In addition, you are responsible for: calling our office [phone], at any time of the day or night, and leaving a message stating your name, name of your surgeon, type of surgery, and date of procedure or surgery. Failure to comply with your responsibilities may result in termination of therapy involving the controlled substances. Medication Agreement Violation. Following the above rules, including your responsibilities will help you in avoiding a Medication Agreement Violation ("Breaking your Pain Medication Contract").  Consequences:  Not following the above rules may result in permanent discontinuation of medication prescription therapy.  *Opioid medications include: morphine, codeine, oxycodone, oxymorphone, hydrocodone, hydromorphone, meperidine, tramadol, tapentadol, buprenorphine, fentanyl, methadone. **Benzodiazepine medications include: diazepam (Valium), alprazolam (Xanax), clonazepam (Klonopine), lorazepam (Ativan), clorazepate (Tranxene), chlordiazepoxide (Librium), estazolam (Prosom), oxazepam (Serax), temazepam (Restoril), triazolam (Halcion) (Last updated: 07/12/2022) ______________________________________________________________________    ______________________________________________________________________  Medication Recommendations and Reminders  Applies to: All patients receiving prescriptions (written and/or  electronic).  Medication Rules & Regulations: You are responsible for reading, knowing, and following our "Medication Rules" document. These exist for your safety and that of others. They are not flexible and neither are we. Dismissing or ignoring them is an act of "non-compliance" that may result in complete and irreversible termination of such medication therapy. For safety reasons, "non-compliance" will not be tolerated. As with the [place] fundamental legal principle of "ignorance of the law is no defense", we will accept no excuses for not having read and knowing the content of documents provided to you by our practice.  Pharmacy of record:  Definition: This is the pharmacy where your electronic prescriptions will be sent.  We do not endorse any particular pharmacy. It is up to you and your insurance to decide what pharmacy to use.  We do not restrict you in your choice of pharmacy. However, once we write for your prescriptions, we will NOT be re-sending more prescriptions to fix restricted supply problems created by your pharmacy, or your insurance.  The pharmacy listed in the electronic medical record should be the one where you want electronic prescriptions to be sent. If you choose to change pharmacy, simply notify our nursing staff. Changes will be made only during your regular appointments and not over the phone.  Recommendations: Keep all of your pain medications in a safe place, under lock and key, even if you live alone. We will NOT replace lost, stolen, or damaged medication. We do not accept "Police Reports" as proof of medications having been stolen. After you fill your prescription, take 1 week's worth of pills and put them away in a safe place. You should keep a separate, properly labeled bottle for this purpose. The remainder should be kept in the original bottle. Use this as your primary supply, until it runs out. Once it's gone, then you know that you have 1 week's worth of medicine,  and it is time to come in for a prescription refill. If you do this correctly, it is unlikely that you will ever run out of medicine. To make sure that the above recommendation works, it is very important that you make   sure your medication refill appointments are scheduled at least 1 week before you run out of medicine. To do this in an effective manner, make sure that you do not leave the office without scheduling your next medication management appointment. Always ask the nursing staff to show you in your prescription , when your medication will be running out. Then arrange for the receptionist to get you a return appointment, at least 7 days before you run out of medicine. Do not wait until you have 1 or 2 pills left, to come in. This is very poor planning and does not take into consideration that we may need to cancel appointments due to bad weather, sickness, or emergencies affecting our staff. DO NOT ACCEPT A "Partial Fill": If for any reason your pharmacy does not have enough pills/tablets to completely fill or refill your prescription, do not allow for a "partial fill". The law allows the pharmacy to complete that prescription within 72 hours, without requiring a new prescription. If they do not fill the rest of your prescription within those 72 hours, you will need a separate prescription to fill the remaining amount, which we will NOT provide. If the reason for the partial fill is your insurance, you will need to talk to the pharmacist about payment alternatives for the remaining tablets, but again, DO NOT ACCEPT A PARTIAL FILL, unless you can trust your pharmacist to obtain the remainder of the pills within 72 hours.  Prescription refills and/or changes in medication(s):  Prescription refills, and/or changes in dose or medication, will be conducted only during scheduled medication management appointments. (Applies to both, written and electronic prescriptions.) No refills on procedure days. No  medication will be changed or started on procedure days. No changes, adjustments, and/or refills will be conducted on a procedure day. Doing so will interfere with the diagnostic portion of the procedure. No phone refills. No medications will be "called into the pharmacy". No Fax refills. No weekend refills. No Holliday refills. No after hours refills.  Remember:  Business hours are:  Monday to Thursday 8:00 AM to 4:00 PM Provider's Schedule: Cornie Herrington, MD - Appointments are:  Medication management: Monday and Wednesday 8:00 AM to 4:00 PM Procedure day: Tuesday and Thursday 7:30 AM to 4:00 PM Bilal Lateef, MD - Appointments are:  Medication management: Tuesday and Thursday 8:00 AM to 4:00 PM Procedure day: Monday and Wednesday 7:30 AM to 4:00 PM (Last update: 07/12/2022) ______________________________________________________________________    ____________________________________________________________________________________________  Drug Holidays  What is a "Drug Holiday"? Drug Holiday: is the name given to the process of slowly tapering down and temporarily stopping the pain medication for the purpose of decreasing or eliminating tolerance to the drug.  Benefits Improved effectiveness Decreased required effective dose Improved pain control End dependence on high dose therapy Decrease cost of therapy Uncovering "opioid-induced hyperalgesia". (OIH)  What is "opioid hyperalgesia"? It is a paradoxical increase in pain caused by exposure to opioids. Stopping the opioid pain medication, contrary to the expected, it actually decreases or completely eliminates the pain. Ref.: "A comprehensive review of opioid-induced hyperalgesia". Marion Lee, et.al. Pain Physician. 2011 Mar-Apr;14(2):145-61.  What is tolerance? Tolerance: the progressive loss of effectiveness of a pain medicine due to repetitive use. A common problem of opioid pain medications.  How long should a "Drug  Holiday" last? Effectiveness depends on the patient staying off all opioid pain medicines for a minimum of 14 consecutive days. (2 weeks)  How about just taking less of the medicine? Does not   work. Will not accomplish goal of eliminating the excess receptors.  How about switching to a different pain medicine? (AKA. "Opioid rotation") Does not work. Creates the illusion of effectiveness by taking advantage of inaccurate equivalent dose calculations between different opioids. -This "technique" was promoted by studies funded by pharmaceutical companies, such as [employer], creators of "OxyContin".  Can I stop the medicine "cold turkey"? We do not recommend it. You should always coordinate with your prescribing physician to make the transition as smoothly as possible. Avoid stopping the medicine abruptly without consulting. We recommend a "slow taper".  What is a slow taper? Taper: refers to the gradual decrease in dose.   How do I stop/taper the dose? Slowly. Decrease the daily amount of pills that you take by one (1) pill every seven (7) days. This is called a "slow downward taper". Example: if you normally take four (4) pills per day, drop it to three (3) pills per day for seven (7) days, then to two (2) pills per day for seven (7) days, then to one (1) per day for seven (7) days, and then stop the medicine. The 14 day "Drug Holiday" starts on the first day without medicine.   Will I experience withdrawals? Unlikely with a slow taper.  What triggers withdrawals? Withdrawals are triggered by the sudden/abrupt stop of high dose opioids. Withdrawals can be avoided by slowly decreasing the dose over a prolonged period of time.  What are withdrawals? Symptoms associated with sudden/abrupt reduction/stopping of high-dose, long-term use of pain medication. Withdrawal are seldom seen on low dose therapy, or patients rarely taking opioid medication.  Early Withdrawal Symptoms may  include: Agitation Anxiety Muscle aches Increased tearing Insomnia Runny nose Sweating Yawning  Late symptoms may include: Abdominal cramping Diarrhea Dilated pupils Goose bumps Nausea Vomiting  When could I see withdrawals? Onset: 8-24 hours after last use for most opioids. 12-48 hours for long-acting opioids (i.e.: methadone)  How long could they last? Duration: 4-10 days for most opioids. 14-21 days for long-acting opioids (i.e.: methadone)  What will happen after I complete my "Drug Holiday"? The need and indications for the opioid analgesic will be reviewed before restarting the medication. Dose requirements will likely decrease and the dose will need to be adjusted accordingly.   (Last update: 12/07/2022) ____________________________________________________________________________________________    ____________________________________________________________________________________________  WARNING: CBD (cannabidiol) & Delta (Delta-8 tetrahydrocannabinol) products.   Applicable to:  All individuals currently taking or considering taking CBD (cannabidiol) and, more important, all patients taking opioid analgesic controlled substances (pain medication). (Example: oxycodone; oxymorphone; hydrocodone; hydromorphone; morphine; methadone; tramadol; tapentadol; fentanyl; buprenorphine; butorphanol; dextromethorphan; meperidine; codeine; etc.)  Introduction:  Recently there has been a drive towards the use of "natural" products for the treatment of different conditions, including pain anxiety and sleep disorders. Marijuana and hemp are two varieties of the cannabis genus plants. Marijuana and its derivatives are illegal, while hemp and its derivatives are not. Cannabidiol (CBD) and tetrahydrocannabinol (THC), are two natural compounds found in plants of the Cannabis genus. They can both be extracted from hemp or marijuana. Both compounds interact with your body's endocannabinoid  system in very different ways. CBD is associated with pain relief (analgesia) while THC is associated with the psychoactive effects ("the high") obtained from the use of marijuana products. There are two main types of THC: Delta-9, which comes from the marijuana plant and it is illegal, and Delta-8, which comes from the hemp plant, and it is legal. (Both, Delta-9-THC and Delta-8-THC are psychoactive and   give you "the high".)   Legality:  Marijuana and its derivatives: illegal Hemp and its derivatives: Legal (State dependent) UPDATE: (11/05/2021) The Drug Enforcement Agency (DEA) issued a letter stating that "delta" cannabinoids, including Delta-8-THCO and Delta-9-THCO, synthetically derived from hemp do not qualify as hemp and will be viewed as Schedule I drugs. (Schedule I drugs, substances, or chemicals are defined as drugs with no currently accepted medical use and a high potential for abuse. Some examples of Schedule I drugs are: heroin, lysergic acid diethylamide (LSD), marijuana (cannabis), 3,4-methylenedioxymethamphetamine (ecstasy), methaqualone, and peyote.) (https://www.dea.gov)  Legal status of CBD in [place]:  "Conditionally Legal"  Reference: "FDA Regulation of Cannabis and Cannabis-Derived Products, Including Cannabidiol (CBD)" - https://www.fda.gov/news-events/public-health-focus/fda-regulation-cannabis-and-cannabis-derived-products-including-cannabidiol-cbd  Warning:  CBD is not FDA approved and has not undergo the same manufacturing controls as prescription drugs.  This means that the purity and safety of available CBD may be questionable. Most of the time, despite manufacturer's claims, it is contaminated with THC (delta-9-tetrahydrocannabinol - the chemical in marijuana responsible for the "HIGH").  When this is the case, the THC contaminant will trigger a positive urine drug screen (UDS) test for Marijuana (carboxy-THC).   The FDA recently put out a warning about 5 things that everyone  should be aware of regarding Delta-8 THC: Delta-8 THC products have not been evaluated or approved by the FDA for safe use and may be marketed in ways that put the public health at risk. The FDA has received adverse event reports involving delta-8 THC-containing products. Delta-8 THC has psychoactive and intoxicating effects. Delta-8 THC manufacturing often involve use of potentially harmful chemicals to create the concentrations of delta-8 THC claimed in the marketplace. The final delta-8 THC product may have potentially harmful by-products (contaminants) due to the chemicals used in the process. Manufacturing of delta-8 THC products may occur in uncontrolled or unsanitary settings, which may lead to the presence of unsafe contaminants or other potentially harmful substances. Delta-8 THC products should be kept out of the reach of children and pets.  NOTE: Because a positive UDS for any illicit substance is a violation of our medication agreement, your opioid analgesics (pain medicine) may be permanently discontinued.  MORE ABOUT CBD  General Information: CBD was discovered in 1940 and it is a derivative of the cannabis sativa genus plants (Marijuana and Hemp). It is one of the 113 identified substances found in Marijuana. It accounts for up to 40% of the plant's extract. As of 2018, preliminary clinical studies on CBD included research for the treatment of anxiety, movement disorders, and pain. CBD is available and consumed in multiple forms, including inhalation of smoke or vapor, as an aerosol spray, and by mouth. It may be supplied as an oil containing CBD, capsules, dried cannabis, or as a liquid solution. CBD is thought not to be as psychoactive as THC (delta-9-tetrahydrocannabinol - the chemical in marijuana responsible for the "HIGH"). Studies suggest that CBD may interact with different biological target receptors in the body, including cannabinoid and other neurotransmitter receptors. As of  2018 the mechanism of action for its biological effects has not been determined.  Side-effects  Adverse reactions: Dry mouth, diarrhea, decreased appetite, fatigue, drowsiness, malaise, weakness, sleep disturbances, and others.  Drug interactions:  CBD may interact with medications such as blood-thinners. CBD causes drowsiness on its own and it will increase drowsiness caused by other medications, including antihistamines (such as Benadryl), benzodiazepines (Xanax, Ativan, Valium), antipsychotics, antidepressants, opioids, alcohol and supplements such as kava, melatonin and St. John's Wort.    Other drug interactions: Brivaracetam (Briviact); Caffeine; Carbamazepine (Tegretol); Citalopram (Celexa); Clobazam (Onfi); Eslicarbazepine (Aptiom); Everolimus (Zostress); Lithium; Methadone (Dolophine); Rufinamide (Banzel); Sedative medications (CNS depressants); Sirolimus (Rapamune); Stiripentol (Diacomit); Tacrolimus (Prograf); Tamoxifen ; Soltamox); Topiramate (Topamax); Valproate; Warfarin (Coumadin); Zonisamide. (Last update: 08/29/2022) ____________________________________________________________________________________________   ____________________________________________________________________________________________  Naloxone Nasal Spray  Why am I receiving this medication? [place] STOP ACT requires that all patients taking high dose opioids or at risk of opioids respiratory depression, be prescribed an opioid reversal agent, such as Naloxone (AKA: Narcan).  What is this medication? NALOXONE (nal OX one) treats opioid overdose, which causes slow or shallow breathing, severe drowsiness, or trouble staying awake. Call emergency services after using this medication. You may need additional treatment. Naloxone works by reversing the effects of opioids. It belongs to a group of medications called opioid blockers.  COMMON BRAND NAME(S): Kloxxado, Narcan  What should I tell my care team before  I take this medication? They need to know if you have any of these conditions: Heart disease Substance use disorder An unusual or allergic reaction to naloxone, other medications, foods, dyes, or preservatives Pregnant or trying to get pregnant Breast-feeding  When to use this medication? This medication is to be used for the treatment of respiratory depression (less than 8 breaths per minute) secondary to opioid overdose.   How to use this medication? This medication is for use in the nose. Lay the person on their back. Support their neck with your hand and allow the head to tilt back before giving the medication. The nasal spray should be given into 1 nostril. After giving the medication, move the person onto their side. Do not remove or test the nasal spray until ready to use. Get emergency medical help right away after giving the first dose of this medication, even if the person wakes up. You should be familiar with how to recognize the signs and symptoms of a narcotic overdose. If more doses are needed, give the additional dose in the other nostril. Talk to your care team about the use of this medication in children. While this medication may be prescribed for children as young as newborns for selected conditions, precautions do apply.  Naloxone Overdosage: If you think you have taken too much of this medicine contact a poison control center or emergency room at once.  NOTE: This medicine is only for you. Do not share this medicine with others.  What if I miss a dose? This does not apply.  What may interact with this medication? This is only used during an emergency. No interactions are expected during emergency use. This list may not describe all possible interactions. Give your health care provider a list of all the medicines, herbs, non-prescription drugs, or dietary supplements you use. Also tell them if you smoke, drink alcohol, or use illegal drugs. Some items may interact with  your medicine.  What should I watch for while using this medication? Keep this medication ready for use in the case of an opioid overdose. Make sure that you have the phone number of your care team and local hospital ready. You may need to have additional doses of this medication. Each nasal spray contains a single dose. Some emergencies may require additional doses. After use, bring the treated person to the nearest hospital or call 911. Make sure the treating care team knows that the person has received a dose of this medication. You will receive additional instructions on what to do during and after use of this   medication before an emergency occurs.  What side effects may I notice from receiving this medication? Side effects that you should report to your care team as soon as possible: Allergic reactions--skin rash, itching, hives, swelling of the face, lips, tongue, or throat Side effects that usually do not require medical attention (report these to your care team if they continue or are bothersome): Constipation Dryness or irritation inside the nose Headache Increase in blood pressure Muscle spasms Stuffy nose Toothache This list may not describe all possible side effects. Call your doctor for medical advice about side effects. You may report side effects to FDA at 1-800-FDA-1088.  Where should I keep my medication? Because this is an emergency medication, you should keep it with you at all times.  Keep out of the reach of children and pets. Store between 20 and 25 degrees C (68 and 77 degrees F). Do not freeze. Throw away any unused medication after the expiration date. Keep in original box until ready to use.  NOTE: This sheet is a summary. It may not cover all possible information. If you have questions about this medicine, talk to your doctor, pharmacist, or health care provider.   2023 Elsevier/Gold Standard (2021-05-14  00:00:00)  ____________________________________________________________________________________________   

## 2023-01-18 NOTE — Progress Notes (Signed)
Safety precautions to be maintained throughout the outpatient stay will include: orient to surroundings, keep bed in low position, maintain call bell within reach at all times, provide assistance with transfer out of bed and ambulation.   Nursing Pain Medication Assessment:  Safety precautions to be maintained throughout the outpatient stay will include: orient to surroundings, keep bed in low position, maintain call bell within reach at all times, provide assistance with transfer out of bed and ambulation.  Medication Inspection Compliance: Pill count conducted under aseptic conditions, in front of the patient. Neither the pills nor the bottle was removed from the patient's sight at any time. Once count was completed pills were immediately returned to the patient in their original bottle.  Medication: Hydrocodone/APAP Pill/Patch Count:  56 of 180 pills remain Pill/Patch Appearance: Markings consistent with prescribed medication Bottle Appearance: Standard pharmacy container. Clearly labeled. Filled Date: 04 / 10 / 2024 Last Medication intake:  Today

## 2023-01-23 LAB — TOXASSURE SELECT 13 (MW), URINE

## 2023-01-24 ENCOUNTER — Other Ambulatory Visit: Payer: Self-pay | Admitting: Internal Medicine

## 2023-01-24 ENCOUNTER — Other Ambulatory Visit: Payer: Self-pay | Admitting: Family Medicine

## 2023-01-24 DIAGNOSIS — F064 Anxiety disorder due to known physiological condition: Secondary | ICD-10-CM

## 2023-01-24 NOTE — Telephone Encounter (Signed)
Dc'd "pt preference" 01/18/23 Delano Metz MD   Requested Prescriptions  Refused Prescriptions Disp Refills   meloxicam (MOBIC) 15 MG tablet [Pharmacy Med Name: MELOXICAM 15 MG TABLET] 30 tablet 0    Sig: TAKE 1 TABLET (15 MG TOTAL) BY MOUTH DAILY.     Analgesics:  COX2 Inhibitors Failed - 01/24/2023  2:41 AM      Failed - Manual Review: Labs are only required if the patient has taken medication for more than 8 weeks.      Passed - HGB in normal range and within 360 days    Hemoglobin  Date Value Ref Range Status  01/13/2023 13.3 11.1 - 15.9 g/dL Final         Passed - Cr in normal range and within 360 days    Creatinine  Date Value Ref Range Status  11/08/2013 0.93 0.60 - 1.30 mg/dL Final   Creatinine, Ser  Date Value Ref Range Status  01/13/2023 0.82 0.57 - 1.00 mg/dL Final         Passed - HCT in normal range and within 360 days    Hematocrit  Date Value Ref Range Status  01/13/2023 39.1 34.0 - 46.6 % Final         Passed - AST in normal range and within 360 days    AST  Date Value Ref Range Status  01/13/2023 23 0 - 40 IU/L Final   SGOT(AST)  Date Value Ref Range Status  11/08/2013 24 15 - 37 Unit/L Final         Passed - ALT in normal range and within 360 days    ALT  Date Value Ref Range Status  01/13/2023 16 0 - 32 IU/L Final   SGPT (ALT)  Date Value Ref Range Status  11/08/2013 19 12 - 78 U/L Final         Passed - eGFR is 30 or above and within 360 days    EGFR (African American)  Date Value Ref Range Status  11/08/2013 >60  Final   GFR calc Af Amer  Date Value Ref Range Status  12/10/2019 88 >59 mL/min/1.73 Final   EGFR (Non-African Amer.)  Date Value Ref Range Status  11/08/2013 >60  Final    Comment:    eGFR values <45mL/min/1.73 m2 may be an indication of chronic kidney disease (CKD). Calculated eGFR is useful in patients with stable renal function. The eGFR calculation will not be reliable in acutely ill patients when serum  creatinine is changing rapidly. It is not useful in  patients on dialysis. The eGFR calculation may not be applicable to patients at the low and high extremes of body sizes, pregnant women, and vegetarians.    GFR calc non Af Amer  Date Value Ref Range Status  12/10/2019 76 >59 mL/min/1.73 Final   eGFR  Date Value Ref Range Status  01/13/2023 84 >59 mL/min/1.73 Final         Passed - Patient is not pregnant      Passed - Valid encounter within last 12 months    Recent Outpatient Visits           1 week ago Annual physical exam   [place] [facility] at [facility], Nyoka Cowden, MD   4 weeks ago Primary osteoarthritis of left knee   [facility] at [facility], Ocie Bob, MD   1 month ago Primary osteoarthritis of left knee  [facility] at [facility], Ocie Bob, MD   2 months ago Chronic pain of left knee   [facility] at [facility], Nyoka Cowden, MD   1 year ago Annual physical exam   [facility] at [facility], Nyoka Cowden, MD

## 2023-01-28 ENCOUNTER — Other Ambulatory Visit: Payer: Self-pay | Admitting: Internal Medicine

## 2023-01-28 DIAGNOSIS — E034 Atrophy of thyroid (acquired): Secondary | ICD-10-CM

## 2023-01-30 NOTE — Telephone Encounter (Signed)
Requested Prescriptions  Pending Prescriptions Disp Refills   levothyroxine (SYNTHROID) 50 MCG tablet [Pharmacy Med Name: LEVOTHYROXINE 50 MCG TABLET] 90 tablet 3    Sig: TAKE 1 TABLET BY MOUTH EVERY DAY     Endocrinology:  Hypothyroid Agents Passed - 01/28/2023  9:26 AM      Passed - TSH in normal range and within 360 days    TSH  Date Value Ref Range Status  01/13/2023 1.930 0.450 - 4.500 uIU/mL Final         Passed - Valid encounter within last 12 months    Recent Outpatient Visits           2 weeks ago Annual physical exam   [place] [facility] at [facility], Nyoka Cowden, MD   1 month ago Primary osteoarthritis of left knee   [facility] at [facility], Ocie Bob, MD   2 months ago Primary osteoarthritis of left knee   [facility] at [facility], Ocie Bob, MD   2 months ago Chronic pain of left knee   [facility] Medicine at [facility], Nyoka Cowden, MD   1 year ago Annual physical exam   [facility] Primary [facility] Medicine at [facility], Nyoka Cowden, MD

## 2023-01-31 ENCOUNTER — Ambulatory Visit
Admission: RE | Admit: 2023-01-31 | Discharge: 2023-01-31 | Disposition: A | Discharge status: Home / Self Care | Payer: Commercial Managed Care - PPO | Source: Ambulatory Visit | Attending: Internal Medicine | Admitting: Internal Medicine

## 2023-01-31 DIAGNOSIS — Z1231 Encounter for screening mammogram for malignant neoplasm of breast: Secondary | ICD-10-CM | POA: Insufficient documentation

## 2023-03-01 LAB — COLOGUARD: COLOGUARD: NEGATIVE

## 2023-03-14 ENCOUNTER — Other Ambulatory Visit: Payer: Self-pay | Admitting: Family Medicine

## 2023-03-15 NOTE — Telephone Encounter (Signed)
Unable to refill per protocol, Rx expired.  Requested Prescriptions  Pending Prescriptions Disp Refills   meloxicam (MOBIC) 15 MG tablet [Pharmacy Med Name: MELOXICAM 15 MG TABLET] 30 tablet 0    Sig: TAKE 1 TABLET (15 MG TOTAL) BY MOUTH DAILY.     Analgesics:  COX2 Inhibitors Failed - 03/14/2023  9:21 PM      Failed - Manual Review: Labs are only required if the patient has taken medication for more than 8 weeks.      Passed - HGB in normal range and within 360 days    Hemoglobin  Date Value Ref Range Status  01/13/2023 13.3 11.1 - 15.9 g/dL Final         Passed - Cr in normal range and within 360 days    Creatinine  Date Value Ref Range Status  11/08/2013 0.93 0.60 - 1.30 mg/dL Final   Creatinine, Ser  Date Value Ref Range Status  01/13/2023 0.82 0.57 - 1.00 mg/dL Final         Passed - HCT in normal range and within 360 days    Hematocrit  Date Value Ref Range Status  01/13/2023 39.1 34.0 - 46.6 % Final         Passed - AST in normal range and within 360 days    AST  Date Value Ref Range Status  01/13/2023 23 0 - 40 IU/L Final   SGOT(AST)  Date Value Ref Range Status  11/08/2013 24 15 - 37 Unit/L Final         Passed - ALT in normal range and within 360 days    ALT  Date Value Ref Range Status  01/13/2023 16 0 - 32 IU/L Final   SGPT (ALT)  Date Value Ref Range Status  11/08/2013 19 12 - 78 U/L Final         Passed - eGFR is 30 or above and within 360 days    EGFR (African American)  Date Value Ref Range Status  11/08/2013 >60  Final   GFR calc Af Amer  Date Value Ref Range Status  12/10/2019 88 >59 mL/min/1.73 Final   EGFR (Non-African Amer.)  Date Value Ref Range Status  11/08/2013 >60  Final    Comment:    eGFR values <50mL/min/1.73 m2 may be an indication of chronic kidney disease (CKD). Calculated eGFR is useful in patients with stable renal function. The eGFR calculation will not be reliable in acutely ill patients when serum creatinine is  changing rapidly. It is not useful in  patients on dialysis. The eGFR calculation may not be applicable to patients at the low and high extremes of body sizes, pregnant women, and vegetarians.    GFR calc non Af Amer  Date Value Ref Range Status  12/10/2019 76 >59 mL/min/1.73 Final   eGFR  Date Value Ref Range Status  01/13/2023 84 >59 mL/min/1.73 Final         Passed - Patient is not pregnant      Passed - Valid encounter within last 12 months    Recent Outpatient Visits           2 months ago Annual physical exam   [place] [facility] at [facility], Nyoka Cowden, MD   2 months ago Primary osteoarthritis of left knee   [facility] at [facility], Ocie Bob, MD   3 months ago Primary osteoarthritis of left knee  [facility] at [facility], Ocie Bob, MD   3 months ago Chronic pain of left knee   [facility] at [facility], Nyoka Cowden, MD   1 year ago Annual physical exam   [facility] at [facility], Nyoka Cowden, MD

## 2023-04-17 ENCOUNTER — Ambulatory Visit (HOSPITAL_BASED_OUTPATIENT_CLINIC_OR_DEPARTMENT_OTHER): Payer: Commercial Managed Care - PPO | Admitting: Pain Medicine

## 2023-04-17 DIAGNOSIS — Z91199 Patient's noncompliance with other medical treatment and regimen due to unspecified reason: Secondary | ICD-10-CM

## 2023-04-17 DIAGNOSIS — Z79899 Other long term (current) drug therapy: Secondary | ICD-10-CM

## 2023-04-17 NOTE — Patient Instructions (Signed)
____________________________________________________________________________________________  Opioid Pain Medication Update  To: All patients taking opioid pain medications. (I.e.: hydrocodone, hydromorphone, oxycodone, oxymorphone, morphine, codeine, methadone, tapentadol, tramadol, buprenorphine, fentanyl, etc.)  Re: Updated review of side effects and adverse reactions of opioid analgesics, as well as new information about long term effects of this class of medications.  Direct risks of long-term opioid therapy are not limited to opioid addiction and overdose. Potential medical risks include serious fractures, breathing problems during sleep, hyperalgesia, immunosuppression, chronic constipation, bowel obstruction, myocardial infarction, and tooth decay secondary to xerostomia.  Unpredictable adverse effects that can occur even if you take your medication correctly: Cognitive impairment, respiratory depression, and death. Most people think that if they take their medication "correctly", and "as instructed", that they will be safe. Nothing could be farther from the truth. In reality, a significant amount of recorded deaths associated with the use of opioids has occurred in individuals that had taken the medication for a long time, and were taking their medication correctly. The following are examples of how this can happen: Patient taking his/her medication for a long time, as instructed, without any side effects, is given a certain antibiotic or another unrelated medication, which in turn triggers a "Drug-to-drug interaction" leading to disorientation, cognitive impairment, impaired reflexes, respiratory depression or an untoward event leading to serious bodily harm or injury, including death.  Patient taking his/her medication for a long time, as instructed, without any side effects, develops an acute impairment of liver and/or kidney function. This will lead to a rapid inability of the body to  breakdown and eliminate their pain medication, which will result in effects similar to an "overdose", but with the same medicine and dose that they had always taken. This again may lead to disorientation, cognitive impairment, impaired reflexes, respiratory depression or an untoward event leading to serious bodily harm or injury, including death.  A similar problem will occur with patients as they grow older and their liver and kidney function begins to decrease as part of the aging process.  Background information: Historically, the original case for using long-term opioid therapy to treat chronic noncancer pain was based on safety assumptions that subsequent experience has called into question. In 1996, the American Pain Society and the American Academy of Pain Medicine issued a consensus statement supporting long-term opioid therapy. This statement acknowledged the dangers of opioid prescribing but concluded that the risk for addiction was low; respiratory depression induced by opioids was short-lived, occurred mainly in opioid-naive patients, and was antagonized by pain; tolerance was not a common problem; and efforts to control diversion should not constrain opioid prescribing. This has now proven to be wrong. Experience regarding the risks for opioid addiction, misuse, and overdose in community practice has failed to support these assumptions.  According to the Centers for Disease Control and Prevention, fatal overdoses involving opioid analgesics have increased sharply over the past decade. Currently, more than 96,700 people die from drug overdoses every year. Opioids are a factor in 7 out of every 10 overdose deaths. Deaths from drug overdose have surpassed motor vehicle accidents as the leading cause of death for individuals between the ages of 80 and 61.  Clinical data suggest that neuroendocrine dysfunction may be very common in both men and women, potentially causing hypogonadism, erectile  dysfunction, infertility, decreased libido, osteoporosis, and depression. Recent studies linked higher opioid dose to increased opioid-related mortality. Controlled observational studies reported that long-term opioid therapy may be associated with increased risk for cardiovascular events. Subsequent meta-analysis concluded  that the safety of long-term opioid therapy in elderly patients has not been proven.   Side Effects and adverse reactions: Common side effects: Drowsiness (sedation). Dizziness. Nausea and vomiting. Constipation. Physical dependence -- Dependence often manifests with withdrawal symptoms when opioids are discontinued or decreased. Tolerance -- As you take repeated doses of opioids, you require increased medication to experience the same effect of pain relief. Respiratory depression -- This can occur in healthy people, especially with higher doses. However, people with COPD, asthma or other lung conditions may be even more susceptible to fatal respiratory impairment.  Uncommon side effects: An increased sensitivity to feeling pain and extreme response to pain (hyperalgesia). Chronic use of opioids can lead to this. Delayed gastric emptying (the process by which the contents of your stomach are moved into your small intestine). Muscle rigidity. Immune system and hormonal dysfunction. Quick, involuntary muscle jerks (myoclonus). Arrhythmia. Itchy skin (pruritus). Dry mouth (xerostomia).  Long-term side effects: Chronic constipation. Sleep-disordered breathing (SDB). Increased risk of bone fractures. Hypothalamic-pituitary-adrenal dysregulation. Increased risk of overdose.  RISKS: Respiratory depression and death: Opioids increase the risk of respiratory depression and death.  Drug-to-drug interactions: Opioids are relatively contraindicated in combination with benzodiazepines, sleep inducers, and other central nervous system depressants. Other classes of medications  (i.e.: certain antibiotics and even over-the-counter medications) may also trigger or induce respiratory depression in some patients.  Medical conditions: Patients with pre-existing respiratory problems are at higher risk of respiratory failure and/or depression when in combination with opioid analgesics. Opioids are relatively contraindicated in some medical conditions such as central sleep apnea.   Fractures and Falls:  Opioids increase the risk and incidence of falls. This is of particular importance in elderly patients.  Endocrine System:  Long-term administration is associated with endocrine abnormalities (endocrinopathies). (Also known as Opioid-induced Endocrinopathy) Influences on both the hypothalamic-pituitary-adrenal axis?and the hypothalamic-pituitary-gonadal axis have been demonstrated with consequent hypogonadism and adrenal insufficiency in both sexes. Hypogonadism and decreased levels of dehydroepiandrosterone sulfate have been reported in men and women. Endocrine effects include: Amenorrhoea in women (abnormal absence of menstruation) Reduced libido in both sexes Decreased sexual function Erectile dysfunction in men Hypogonadisms (decreased testicular function with shrinkage of testicles) Infertility Depression and fatigue Loss of muscle mass Anxiety Depression Immune suppression Hyperalgesia Weight gain Anemia Osteoporosis Patients (particularly women of childbearing age) should avoid opioids. There is insufficient evidence to recommend routine monitoring of asymptomatic patients taking opioids in the long-term for hormonal deficiencies.  Immune System: Human studies have demonstrated that opioids have an immunomodulating effect. These effects are mediated via opioid receptors both on immune effector cells and in the central nervous system. Opioids have been demonstrated to have adverse effects on antimicrobial response and anti-tumour surveillance. Buprenorphine has  been demonstrated to have no impact on immune function.  Opioid Induced Hyperalgesia: Human studies have demonstrated that prolonged use of opioids can lead to a state of abnormal pain sensitivity, sometimes called opioid induced hyperalgesia (OIH). Opioid induced hyperalgesia is not usually seen in the absence of tolerance to opioid analgesia. Clinically, hyperalgesia may be diagnosed if the patient on long-term opioid therapy presents with increased pain. This might be qualitatively and anatomically distinct from pain related to disease progression or to breakthrough pain resulting from development of opioid tolerance. Pain associated with hyperalgesia tends to be more diffuse than the pre-existing pain and less defined in quality. Management of opioid induced hyperalgesia requires opioid dose reduction.  Cancer: Chronic opioid therapy has been associated with an increased risk of cancer  among noncancer patients with chronic pain. This association was more evident in chronic strong opioid users. Chronic opioid consumption causes significant pathological changes in the small intestine and colon. Epidemiological studies have found that there is a link between opium dependence and initiation of gastrointestinal cancers. Cancer is the second leading cause of death after cardiovascular disease. Chronic use of opioids can cause multiple conditions such as GERD, immunosuppression and renal damage as well as carcinogenic effects, which are associated with the incidence of cancers.   Mortality: Long-term opioid use has been associated with increased mortality among patients with chronic non-cancer pain (CNCP).  Prescription of long-acting opioids for chronic noncancer pain was associated with a significantly increased risk of all-cause mortality, including deaths from causes other than overdose.  Reference: Von Korff M, Kolodny A, Deyo RA, Chou R. Long-term opioid therapy reconsidered. Ann Intern Med. 2011  Sep 6;155(5):325-8. doi: 10.7326/[id number]. PMID: 64403474; PMCID: [id number]. Randon Goldsmith, Hayward RA, Dunn KM, [place] KP. Risk of adverse events in patients prescribed long-term opioids: A cohort study in the [place] [facility] Research Datalink. Eur J Pain. 2019 May;23(5):908-922. doi: 10.1002/ejp.1357. Epub 2019 Jan 31. PMID: 43329518. Colameco S, Coren JS, Ciervo CA. Continuous opioid treatment for chronic noncancer pain: a time for moderation in prescribing. Postgrad Med. 2009 Jul;121(4):61-6. doi: 10.3810/pgm.2009.07.2032. PMID: 84166063. William Hamburger RN, [place], Blazina I, Cristopher Peru, Bougatsos C, Deyo RA. The effectiveness and risks of long-term opioid therapy for chronic pain: a systematic review for a [employer] of [employer] Pathways to [employer]. Ann Intern Med. 2015 Feb 17;162(4):276-86. doi: 10.7326/M14-2559. PMID: 01601093. Caryl Bis [facility], Makuc DM. NCHS Data Brief No. 22. [place]: Centers for Disease Control and Prevention; 2009. Sep, Increase in Fatal Poisonings Involving Opioid Analgesics in the [place], 1999-2006. Song IA, Choi HR, Oh TK. Long-term opioid use and mortality in patients with chronic non-cancer pain: Ten-year follow-up study in [place] from 2010 through 2019. EClinicalMedicine. 2022 Jul 18;51:101558. doi: 10.1016/j.eclinm.2022.235573. PMID: 22025427; PMCID: [id number]. Huser, W., Schubert, T., Vogelmann, T. et al. All-cause mortality in patients with long-term opioid therapy compared with non-opioid analgesics for chronic non-cancer pain: a database study. BMC Med 18, 162 (2020). http://lester.info/ Rashidian H, Karie Kirks, Malekzadeh R, Haghdoost AA. An Ecological Study of the Association between Opiate Use and Incidence of Cancers. Addict Health. 2016 Fall;8(4):252-260. PMID: 51761607; PMCID: [id number].  Our Goal: Our goal is to control your  pain with means other than the use of opioid pain medications.  Our Recommendation: Talk to your physician about coming off of these medications. We can assist you with the tapering down and stopping these medicines. Based on the new information, even if you cannot completely stop the medication, a decrease in the dose may be associated with a lesser risk. Ask for other means of controlling the pain. Decrease or eliminate those factors that significantly contribute to your pain such as smoking, obesity, and a diet heavily tilted towards "inflammatory" nutrients.  Last Updated: 03/27/2023   ____________________________________________________________________________________________     ____________________________________________________________________________________________  National Pain Medication Shortage  The [place] is experiencing worsening drug shortages. These have had a negative widespread effect on patient care and treatment. Not expected to improve any time soon. Predicted to last past 2029.   Drug shortage list (generic names) Oxycodone IR Oxycodone/APAP Oxymorphone IR Hydromorphone Hydrocodone/APAP Morphine  Where is the problem?  Manufacturing and supply level.  Will this shortage affect you?  Only if you  take any of the above pain medications.  How? You may be unable to fill your prescription.  Your pharmacist may offer a "partial fill" of your prescription. (Warning: Do not accept partial fills.) Prescriptions partially filled cannot be transferred to another pharmacy. Read our Medication Rules and Regulation. Depending on how much medicine you are dependent on, you may experience withdrawals when unable to get the medication.  Recommendations: Consider ending your dependence on opioid pain medications. Ask your pain specialist to assist you with the process. Consider switching to a medication currently not in shortage, such as Buprenorphine. Talk to your pain  specialist about this option. Consider decreasing your pain medication requirements by managing tolerance thru "Drug Holidays". This may help minimize withdrawals, should you run out of medicine. Control your pain thru the use of non-pharmacological interventional therapies.   Your prescriber: Prescribers cannot be blamed for shortages. Medication manufacturing and supply issues cannot be fixed by the prescriber.   NOTE: The prescriber is not responsible for supplying the medication, or solving supply issues. Work with your pharmacist to solve it. The patient is responsible for the decision to take or continue taking the medication and for identifying and securing a legal supply source. By law, supplying the medication is the job and responsibility of the pharmacy. The prescriber is responsible for the evaluation, monitoring, and prescribing of these medications.   Prescribers will NOT: Re-issue prescriptions that have been partially filled. Re-issue prescriptions already sent to a pharmacy.  Re-send prescriptions to a different pharmacy because yours did not have your medication. Ask pharmacist to order more medicine or transfer the prescription to another pharmacy. (Read below.)  New 2023 regulation: "May 20, 2022 Revised Regulation Allows DEA-Registered Pharmacies to Transfer Electronic Prescriptions at a Patient's Request DEA Headquarters Division - Public Information Office Patients now have the ability to request their electronic prescription be transferred to another pharmacy without having to go back to their practitioner to initiate the request. This revised regulation went into effect on Monday, May 16, 2022.     At a patient's request, a DEA-registered retail pharmacy can now transfer an electronic prescription for a controlled substance (schedules II-V) to another DEA-registered retail pharmacy. Prior to this change, patients would have to go through their practitioner to  cancel their prescription and have it re-issued to a different pharmacy. The process was taxing and time consuming for both patients and practitioners.    The Drug Enforcement Administration [facility]) published its intent to revise the process for transferring electronic prescriptions on August 07, 2020.  The final rule was published in the federal register on April 14, 2022 and went into effect 30 days later.  Under the final rule, a prescription can only be transferred once between pharmacies, and only if allowed under existing state or other applicable law. The prescription must remain in its electronic form; may not be altered in any way; and the transfer must be communicated directly between two licensed pharmacists. It's important to note, any authorized refills transfer with the original prescription, which means the entire prescription will be filled at the same pharmacy".  Reference: [email or web address].is [facility] website announcement)  [email or web address] [employer] of Justice)   [employer] / Vol. 88, No. 143 / Thursday, April 14, 2022 / Rules and Regulations DEPARTMENT OF JUSTICE  Drug Enforcement Administration  21 CFR Part 1306  [Docket No. [id number]]  RIN [id number] Transfer of Electronic Prescriptions for Schedules II-V Controlled Substances Between Pharmacies for Initial Filling  ____________________________________________________________________________________________  ____________________________________________________________________________________________  Transfer of Pain Medication between Pharmacies  Re: 2023 DEA Clarification on existing regulation  Published on DEA Website: May 20, 2022  Title: Revised Regulation Allows DEA-Registered Pharmacies to Electrical engineer Prescriptions at a Patient's  Request DEA Headquarters Division - [employer]  "Patients now have the ability to request their electronic prescription be transferred to another pharmacy without having to go back to their practitioner to initiate the request. This revised regulation went into effect on Monday, May 16, 2022.     At a patient's request, a DEA-registered retail pharmacy can now transfer an electronic prescription for a controlled substance (schedules II-V) to another DEA-registered retail pharmacy. Prior to this change, patients would have to go through their practitioner to cancel their prescription and have it re-issued to a different pharmacy. The process was taxing and time consuming for both patients and practitioners.    The Drug Enforcement Administration [facility]) published its intent to revise the process for transferring electronic prescriptions on August 07, 2020.  The final rule was published in the federal register on April 14, 2022 and went into effect 30 days later.  Under the final rule, a prescription can only be transferred once between pharmacies, and only if allowed under existing state or other applicable law. The prescription must remain in its electronic form; may not be altered in any way; and the transfer must be communicated directly between two licensed pharmacists. It's important to note, any authorized refills transfer with the original prescription, which means the entire prescription will be filled at the same pharmacy."    REFERENCES: 1. DEA website announcement [email or web address]  2. Department of Justice website  [email or web address]  3. DEPARTMENT OF JUSTICE Drug Enforcement Administration 21 CFR Part 1306 [Docket No. [id number]] RIN [id number] "Transfer of Electronic Prescriptions for Schedules II-V Controlled Substances  Between Pharmacies for Initial Filling"  ____________________________________________________________________________________________     _______________________________________________________________________  Medication Rules  Purpose: To inform patients, and their family members, of our medication rules and regulations.  Applies to: All patients receiving prescriptions from our practice (written or electronic).  Pharmacy of record: This is the pharmacy where your electronic prescriptions will be sent. Make sure we have the correct one.  Electronic prescriptions: In compliance with the [facility] Strengthen Opioid Misuse Prevention (STOP) Act of 2017 (Session Conni Elliot [phone]), effective September 19, 2018, all controlled substances must be electronically prescribed. Written prescriptions, faxing, or calling prescriptions to a pharmacy will no longer be done.  Prescription refills: These will be provided only during in-person appointments. No medications will be renewed without a "face-to-face" evaluation with your provider. Applies to all prescriptions.  NOTE: The following applies primarily to controlled substances (Opioid* Pain Medications).   Type of encounter (visit): For patients receiving controlled substances, face-to-face visits are required. (Not an option and not up to the patient.)  Patient's responsibilities: Pain Pills: Bring all pain pills to every appointment (except for procedure appointments). Pill Bottles: Bring pills in original pharmacy bottle. Bring bottle, even if empty. Always bring the bottle of the most recent fill.  Medication refills: You are responsible for knowing and keeping track of what medications you are taking and when is it that you will need a refill. The day before your appointment: write a list of all prescriptions that need to be refilled. The day of the appointment: give the list to the admitting nurse. Prescriptions will be written only  during appointments. No prescriptions will be written on procedure days. If you forget a  medication: it will not be "Called in", "Faxed", or "electronically sent". You will need to get another appointment to get these prescribed. No early refills. Do not call asking to have your prescription filled early. Partial  or short prescriptions: Occasionally your pharmacy may not have enough pills to fill your prescription.  NEVER ACCEPT a partial fill or a prescription that is short of the total amount of pills that you were prescribed.  With controlled substances the law allows 72 hours for the pharmacy to complete the prescription.  If the prescription is not completed within 72 hours, the pharmacist will require a new prescription to be written. This means that you will be short on your medicine and we WILL NOT send another prescription to complete your original prescription.  Instead, request the pharmacy to send a carrier to a nearby branch to get enough medication to provide you with your full prescription. Prescription Accuracy: You are responsible for carefully inspecting your prescriptions before leaving our office. Have the discharge nurse carefully go over each prescription with you, before taking them home. Make sure that your name is accurately spelled, that your address is correct. Check the name and dose of your medication to make sure it is accurate. Check the number of pills, and the written instructions to make sure they are clear and accurate. Make sure that you are given enough medication to last until your next medication refill appointment. Taking Medication: Take medication as prescribed. When it comes to controlled substances, taking less pills or less frequently than prescribed is permitted and encouraged. Never take more pills than instructed. Never take the medication more frequently than prescribed.  Inform other Doctors: Always inform, all of your healthcare providers, of all the  medications you take. Pain Medication from other Providers: You are not allowed to accept any additional pain medication from any other Doctor or Healthcare provider. There are two exceptions to this rule. (see below) In the event that you require additional pain medication, you are responsible for notifying us, as stated below. Cough Medicine: Often these contain an opioid, such as codeine or hydrocodone. Never accept or take cough medicine containing these opioids if you are already taking an opioid* medication. The combination may cause respiratory failure and death. Medication Agreement: You are responsible for carefully reading and following our Medication Agreement. This must be signed before receiving any prescriptions from our practice. Safely store a copy of your signed Agreement. Violations to the Agreement will result in no further prescriptions. (Additional copies of our Medication Agreement are available upon request.) Laws, Rules, & Regulations: All patients are expected to follow all [address] and [employer], [employer], Rules, [place] [employer]. Ignorance of the Laws does not constitute a valid excuse.  Illegal drugs and Controlled Substances: The use of illegal substances (including, but not limited to marijuana and its derivatives) and/or the illegal use of any controlled substances is strictly prohibited. Violation of this rule may result in the immediate and permanent discontinuation of any and all prescriptions being written by our practice. The use of any illegal substances is prohibited. Adopted CDC guidelines & recommendations: Target dosing levels will be at or below 60 MME/day. Use of benzodiazepines** is not recommended.  Exceptions: There are only two exceptions to the rule of not receiving pain medications from other Healthcare Providers. Exception #1 (Emergencies): In the event of an emergency (i.e.: accident requiring emergency care), you are allowed to receive additional pain  medication. However, you are responsible for: As soon as  you are able, call our office [phone], at any time of the day or night, and leave a message stating your name, the date and nature of the emergency, and the name and dose of the medication prescribed. In the event that your call is answered by a member of our staff, make sure to document and save the date, time, and the name of the person that took your information.  Exception #2 (Planned Surgery): In the event that you are scheduled by another doctor or dentist to have any type of surgery or procedure, you are allowed (for a period no longer than 30 days), to receive additional pain medication, for the acute post-op pain. However, in this case, you are responsible for picking up a copy of our "Post-op Pain Management for Surgeons" handout, and giving it to your surgeon or dentist. This document is available at our office, and does not require an appointment to obtain it. Simply go to our office during business hours (Monday-Thursday from 8:00 AM to 4:00 PM) (Friday 8:00 AM to 12:00 Noon) or if you have a scheduled appointment with [place], prior to your surgery, and ask for it by name. In addition, you are responsible for: calling our office [phone], at any time of the day or night, and leaving a message stating your name, name of your surgeon, type of surgery, and date of procedure or surgery. Failure to comply with your responsibilities may result in termination of therapy involving the controlled substances. Medication Agreement Violation. Following the above rules, including your responsibilities will help you in avoiding a Medication Agreement Violation ("Breaking your Pain Medication Contract").  Consequences:  Not following the above rules may result in permanent discontinuation of medication prescription therapy.  *Opioid medications include: morphine, codeine, oxycodone, oxymorphone, hydrocodone, hydromorphone, meperidine, tramadol,  tapentadol, buprenorphine, fentanyl, methadone. **Benzodiazepine medications include: diazepam (Valium), alprazolam (Xanax), clonazepam (Klonopine), lorazepam (Ativan), clorazepate (Tranxene), chlordiazepoxide (Librium), estazolam (Prosom), oxazepam (Serax), temazepam (Restoril), triazolam (Halcion) (Last updated: 07/12/2022) ______________________________________________________________________    ______________________________________________________________________  Medication Recommendations and Reminders  Applies to: All patients receiving prescriptions (written and/or electronic).  Medication Rules & Regulations: You are responsible for reading, knowing, and following our "Medication Rules" document. These exist for your safety and that of others. They are not flexible and neither are we. Dismissing or ignoring them is an act of "non-compliance" that may result in complete and irreversible termination of such medication therapy. For safety reasons, "non-compliance" will not be tolerated. As with the [place] fundamental legal principle of "ignorance of the law is no defense", we will accept no excuses for not having read and knowing the content of documents provided to you by our practice.  Pharmacy of record:  Definition: This is the pharmacy where your electronic prescriptions will be sent.  We do not endorse any particular pharmacy. It is up to you and your insurance to decide what pharmacy to use.  We do not restrict you in your choice of pharmacy. However, once we write for your prescriptions, we will NOT be re-sending more prescriptions to fix restricted supply problems created by your pharmacy, or your insurance.  The pharmacy listed in the electronic medical record should be the one where you want electronic prescriptions to be sent. If you choose to change pharmacy, simply notify our nursing staff. Changes will be made only during your regular appointments and not over the  phone.  Recommendations: Keep all of your pain medications in a safe place, under lock and key, even  if you live alone. We will NOT replace lost, stolen, or damaged medication. We do not accept "Police Reports" as proof of medications having been stolen. After you fill your prescription, take 1 week's worth of pills and put them away in a safe place. You should keep a separate, properly labeled bottle for this purpose. The remainder should be kept in the original bottle. Use this as your primary supply, until it runs out. Once it's gone, then you know that you have 1 week's worth of medicine, and it is time to come in for a prescription refill. If you do this correctly, it is unlikely that you will ever run out of medicine. To make sure that the above recommendation works, it is very important that you make sure your medication refill appointments are scheduled at least 1 week before you run out of medicine. To do this in an effective manner, make sure that you do not leave the office without scheduling your next medication management appointment. Always ask the nursing staff to show you in your prescription , when your medication will be running out. Then arrange for the receptionist to get you a return appointment, at least 7 days before you run out of medicine. Do not wait until you have 1 or 2 pills left, to come in. This is very poor planning and does not take into consideration that we may need to cancel appointments due to bad weather, sickness, or emergencies affecting our staff. DO NOT ACCEPT A "Partial Fill": If for any reason your pharmacy does not have enough pills/tablets to completely fill or refill your prescription, do not allow for a "partial fill". The law allows the pharmacy to complete that prescription within 72 hours, without requiring a new prescription. If they do not fill the rest of your prescription within those 72 hours, you will need a separate prescription to fill the remaining  amount, which we will NOT provide. If the reason for the partial fill is your insurance, you will need to talk to the pharmacist about payment alternatives for the remaining tablets, but again, DO NOT ACCEPT A PARTIAL FILL, unless you can trust your pharmacist to obtain the remainder of the pills within 72 hours.  Prescription refills and/or changes in medication(s):  Prescription refills, and/or changes in dose or medication, will be conducted only during scheduled medication management appointments. (Applies to both, written and electronic prescriptions.) No refills on procedure days. No medication will be changed or started on procedure days. No changes, adjustments, and/or refills will be conducted on a procedure day. Doing so will interfere with the diagnostic portion of the procedure. No phone refills. No medications will be "called into the pharmacy". No Fax refills. No weekend refills. No Holliday refills. No after hours refills.  Remember:  Business hours are:  Monday to Thursday 8:00 AM to 4:00 PM Provider's Schedule: Delano Metz, MD - Appointments are:  Medication management: Monday and Wednesday 8:00 AM to 4:00 PM Procedure day: Tuesday and Thursday 7:30 AM to 4:00 PM Edward Jolly, MD - Appointments are:  Medication management: Tuesday and Thursday 8:00 AM to 4:00 PM Procedure day: Monday and Wednesday 7:30 AM to 4:00 PM (Last update: 07/12/2022) ______________________________________________________________________   ____________________________________________________________________________________________  Naloxone Nasal Spray  Why am I receiving this medication? [place] STOP ACT requires that all patients taking high dose opioids or at risk of opioids respiratory depression, be prescribed an opioid reversal agent, such as Naloxone (AKA: Narcan).  What is this medication? NALOXONE (  nal OX one) treats opioid overdose, which causes slow or shallow breathing,  severe drowsiness, or trouble staying awake. Call emergency services after using this medication. You may need additional treatment. Naloxone works by reversing the effects of opioids. It belongs to a group of medications called opioid blockers.  COMMON BRAND NAME(S): Kloxxado, Narcan  What should I tell my care team before I take this medication? They need to know if you have any of these conditions: Heart disease Substance use disorder An unusual or allergic reaction to naloxone, other medications, foods, dyes, or preservatives Pregnant or trying to get pregnant Breast-feeding  When to use this medication? This medication is to be used for the treatment of respiratory depression (less than 8 breaths per minute) secondary to opioid overdose.   How to use this medication? This medication is for use in the nose. Lay the person on their back. Support their neck with your hand and allow the head to tilt back before giving the medication. The nasal spray should be given into 1 nostril. After giving the medication, move the person onto their side. Do not remove or test the nasal spray until ready to use. Get emergency medical help right away after giving the first dose of this medication, even if the person wakes up. You should be familiar with how to recognize the signs and symptoms of a narcotic overdose. If more doses are needed, give the additional dose in the other nostril. Talk to your care team about the use of this medication in children. While this medication may be prescribed for children as young as newborns for selected conditions, precautions do apply.  Naloxone Overdosage: If you think you have taken too much of this medicine contact a poison control center or emergency room at once.  NOTE: This medicine is only for you. Do not share this medicine with others.  What if I miss a dose? This does not apply.  What may interact with this medication? This is only used during an  emergency. No interactions are expected during emergency use. This list may not describe all possible interactions. Give your health care provider a list of all the medicines, herbs, non-prescription drugs, or dietary supplements you use. Also tell them if you smoke, drink alcohol, or use illegal drugs. Some items may interact with your medicine.  What should I watch for while using this medication? Keep this medication ready for use in the case of an opioid overdose. Make sure that you have the phone number of your care team and local hospital ready. You may need to have additional doses of this medication. Each nasal spray contains a single dose. Some emergencies may require additional doses. After use, bring the treated person to the nearest hospital or call 911. Make sure the treating care team knows that the person has received a dose of this medication. You will receive additional instructions on what to do during and after use of this medication before an emergency occurs.  What side effects may I notice from receiving this medication? Side effects that you should report to your care team as soon as possible: Allergic reactions--skin rash, itching, hives, swelling of the face, lips, tongue, or throat Side effects that usually do not require medical attention (report these to your care team if they continue or are bothersome): Constipation Dryness or irritation inside the nose Headache Increase in blood pressure Muscle spasms Stuffy nose Toothache This list may not describe all possible side effects. Call your doctor for  medical advice about side effects. You may report side effects to FDA at 1-800-FDA-1088.  Where should I keep my medication? Because this is an emergency medication, you should keep it with you at all times.  Keep out of the reach of children and pets. Store between 20 and 25 degrees C (68 and 77 degrees F). Do not freeze. Throw away any unused medication after the  expiration date. Keep in original box until ready to use.  NOTE: This sheet is a summary. It may not cover all possible information. If you have questions about this medicine, talk to your doctor, pharmacist, or health care provider.   2023 [employer]/Gold Standard (2021-05-14 00:00:00)  ____________________________________________________________________________________________

## 2023-04-17 NOTE — Progress Notes (Signed)
(  04/17/2023) NO-SHOW to medication management encounter.

## 2023-05-01 ENCOUNTER — Ambulatory Visit: Payer: Commercial Managed Care - PPO | Attending: Pain Medicine | Admitting: Pain Medicine

## 2023-05-01 ENCOUNTER — Encounter: Payer: Self-pay | Admitting: Pain Medicine

## 2023-05-01 DIAGNOSIS — Z79899 Other long term (current) drug therapy: Secondary | ICD-10-CM | POA: Insufficient documentation

## 2023-05-01 DIAGNOSIS — M25531 Pain in right wrist: Secondary | ICD-10-CM | POA: Diagnosis not present

## 2023-05-01 DIAGNOSIS — Z79891 Long term (current) use of opiate analgesic: Secondary | ICD-10-CM | POA: Insufficient documentation

## 2023-05-01 DIAGNOSIS — G894 Chronic pain syndrome: Secondary | ICD-10-CM | POA: Diagnosis not present

## 2023-05-01 DIAGNOSIS — M25551 Pain in right hip: Secondary | ICD-10-CM | POA: Diagnosis not present

## 2023-05-01 DIAGNOSIS — M545 Low back pain, unspecified: Secondary | ICD-10-CM | POA: Diagnosis present

## 2023-05-01 DIAGNOSIS — G90511 Complex regional pain syndrome I of right upper limb: Secondary | ICD-10-CM | POA: Insufficient documentation

## 2023-05-01 DIAGNOSIS — G8929 Other chronic pain: Secondary | ICD-10-CM | POA: Insufficient documentation

## 2023-05-01 MED ORDER — HYDROCODONE-ACETAMINOPHEN 10-325 MG PO TABS: 1.0000 | ORAL_TABLET | ORAL | 0 refills | Status: DC | PRN

## 2023-05-01 NOTE — Progress Notes (Signed)
PROVIDER NOTE: Information contained herein reflects review and annotations entered in association with encounter. Interpretation of such information and data should be left to medically-trained personnel. Information provided to patient can be located elsewhere in the medical record under "Patient Instructions". Document created using STT-dictation technology, any transcriptional errors that may result from process are unintentional.    Patient: Wendy Snyder  Service Category: E/M  Provider: Oswaldo Done, MD  DOB: [birth year]  DOS: 05/01/2023  Referring Provider: Reubin Milan, MD  MRN: [id number]  Specialty: Interventional Pain Management  PCP: Reubin Milan, MD  Type: Established Patient  Setting: Ambulatory outpatient    Location: Office  Delivery: Face-to-face     HPI  [name], a 57 y.o. year old female, is here today because of her No primary diagnosis found.. [name]'s primary complain today is Back Pain (lower)  Pertinent problems: [name] has Chronic pain syndrome; Presence of functional implant (Rechargable [employer] Neurostimulator) (Cervical Epidural Leads); Spinal cord stimulator in situ (cervical leads); Chronic upper extremity pain (Right); RSD (reflex sympathetic dystrophy) (right upper extremity); Chronic low back pain (1ry area of Pain) (Bilateral) (R>L) w/o sciatica; Chronic hip pain (2ry area of Pain) (Right); Chronic wrist pain (3ry area of Pain) (Right) (CRPS); Chronic shoulder pain (Right); CRPS (complex regional pain syndrome), type I, upper (Right); Chronic pain of left knee; Primary osteoarthritis of left knee; and Rotator cuff tendinitis, left on their pertinent problem list. Pain Assessment: Severity of Chronic pain is reported as a 2 /10. Location: Back Lower/radiates down to both knees. Onset: More than a month ago. Quality: Constant, Aching. Timing: Constant. Modifying factor(s): meds, walking. Vitals:  height is 5\' 7"  (1.702 m)  and weight is 150 lb (68 kg). Her temperature is 97.8 F (36.6 C). Her blood pressure is 144/87 (abnormal) and her pulse is 74. Her oxygen saturation is 100%.  BMI: Estimated body mass index is 23.49 kg/m as calculated from the following:   Height as of this encounter: 5\' 7"  (1.702 m).   Weight as of this encounter: 150 lb (68 kg). Last encounter: 04/17/2023. Last procedure: Visit date not found.  Reason for encounter: medication management.  The patient indicates doing well with the current medication regimen. No adverse reactions or side effects reported to the medications.  Unfortunately, the patient did not bring her pills to be counted and therefore we will provide her with only a 30 day refill.  RTCB: 05/31/2023    Pharmacotherapy Assessment  Analgesic: Hydrocodone/APAP 10/325, 1 tab PO q 4 hrs (60 mg/day of hydrocodone) MME/day: 60 mg/day.   Monitoring: [place] PMP: PDMP reviewed during this encounter.       Pharmacotherapy: No side-effects or adverse reactions reported. Compliance: No problems identified. Effectiveness: Clinically acceptable.  Florina Ou, RN  05/01/2023 12:49 PM  Sign when Signing Visit Safety precautions to be maintained throughout the outpatient stay will include: orient to surroundings, keep bed in low position, maintain call bell within reach at all times, provide assistance with transfer out of bed and ambulation. Safety precautions to be maintained throughout the outpatient stay will include: orient to surroundings, keep bed in low position, maintain call bell within reach at all times, provide assistance with transfer out of bed and ambulation.   Nursing Pain Medication Assessment:  Safety precautions to be maintained throughout the outpatient stay will include: orient to surroundings, keep bed in low position, maintain call bell within reach at all times, provide assistance with transfer out  of bed and ambulation.  Medication Inspection Compliance: Ms.  Wendy Snyder did not comply with our request to bring her pills to be counted. She was reminded that bringing the medication bottles, even when empty, is a requirement.  Medication: Hydrocodone/APAP Pill/Patch Count: No pills available to be counted. Pill/Patch Appearance:  did not bring pills Bottle Appearance: No container available. Did not bring bottle(s) to appointment. Filled Date: did not bring pills / did not bring pills / 2024 Last Medication intake:  Ran out of medicine more than 48 hours ago    No results found for: "CBDTHCR" No results found for: "D8THCCBX" No results found for: "D9THCCBX"  UDS:  Summary  Date Value Ref Range Status  01/18/2023 Note  Final    Comment:    ==================================================================== [facility] (MW) ==================================================================== Test                             Result       Flag       Units  Drug Present and Declared for Prescription Verification   Hydrocodone                    901          EXPECTED   ng/mg creat   Hydromorphone                  452          EXPECTED   ng/mg creat   Dihydrocodeine                 218          EXPECTED   ng/mg creat   Norhydrocodone                 2688         EXPECTED   ng/mg creat    Sources of hydrocodone include scheduled prescription medications.    Hydromorphone, dihydrocodeine and norhydrocodone are expected    metabolites of hydrocodone. Hydromorphone and dihydrocodeine are    also available as scheduled prescription medications.  Drug Absent but Declared for Prescription Verification   Alprazolam                     Not Detected UNEXPECTED ng/mg creat ==================================================================== Test                      Result    Flag   Units      Ref Range   Creatinine              119              mg/dL      >=19 ==================================================================== Declared  Medications:  The flagging and interpretation on this report are based on the  following declared medications.  Unexpected results may arise from  inaccuracies in the declared medications.   **Note: The testing scope of this panel includes these medications:   Alprazolam (Xanax)  Hydrocodone (Norco)   **Note: The testing scope of this panel does not include the  following reported medications:   Acetaminophen (Norco)  Acyclovir (Zovirax)  Levothyroxine (Synthroid)  Multivitamin  Naloxone (Narcan)  Naproxen (Aleve)  Triamcinolone (Kenalog)  Valacyclovir (Valtrex)  Venlafaxine (Effexor)  Vitamin D3 ==================================================================== For clinical consultation, please call [phone]. ====================================================================       ROS  Constitutional: Denies any fever or chills  Gastrointestinal: No reported hemesis, hematochezia, vomiting, or acute GI distress Musculoskeletal: Denies any acute onset joint swelling, redness, loss of ROM, or weakness Neurological: No reported episodes of acute onset apraxia, aphasia, dysarthria, agnosia, amnesia, paralysis, loss of coordination, or loss of consciousness  Medication Review  ALPRAZolam, HYDROcodone-acetaminophen, Vitamin D3, acyclovir ointment, levothyroxine, multivitamin, naloxone, valACYclovir, and venlafaxine XR  History Review  Allergy: [name] is allergic to phenergan [promethazine hcl]. Drug: [name]  reports no history of drug use. Alcohol:  reports no history of alcohol use. Tobacco:  reports that she has never smoked. She has never used smokeless tobacco. Social: [name]  reports that she has never smoked. She has never used smokeless tobacco. She reports that she does not drink alcohol and does not use drugs. Medical:  has a past medical history of Depression, Hypothyroidism, and Thyroid disease. Surgical: [name]  has a past surgical  history that includes Spinal cord stimulator implant (2003); Tubal ligation (1990); Fracture surgery (Right); and Breast cyst excision (Left). Family: family history includes Breast cancer in her maternal grandmother; Breast cancer (age of onset: 72) in her mother; Depression in her mother. She was adopted.  Laboratory Chemistry Profile   Renal Lab Results  Component Value Date   BUN 12 01/13/2023   CREATININE 0.82 01/13/2023   BCR 15 01/13/2023   GFRAA 88 12/10/2019   GFRNONAA 76 12/10/2019    Hepatic Lab Results  Component Value Date   AST 23 01/13/2023   ALT 16 01/13/2023   ALBUMIN 4.8 01/13/2023   ALKPHOS 58 01/13/2023    Electrolytes Lab Results  Component Value Date   NA 144 01/13/2023   K 4.0 01/13/2023   CL 104 01/13/2023   CALCIUM 10.5 (H) 01/13/2023   MG 2.1 12/10/2019    Bone Lab Results  Component Value Date   25OHVITD1 84 12/10/2019   25OHVITD2 <1.0 12/10/2019   25OHVITD3 84 12/10/2019    Inflammation (CRP: Acute Phase) (ESR: Chronic Phase) Lab Results  Component Value Date   CRP <1 12/10/2019   ESRSEDRATE 2 12/10/2019         Note: Above Lab results reviewed.  Recent Imaging Review  MM 3D SCREENING MAMMOGRAM BILATERAL BREAST CLINICAL DATA:  Screening.  EXAM: DIGITAL SCREENING BILATERAL MAMMOGRAM WITH TOMOSYNTHESIS AND CAD  TECHNIQUE: Bilateral screening digital craniocaudal and mediolateral oblique mammograms were obtained. Bilateral screening digital breast tomosynthesis was performed. The images were evaluated with computer-aided detection.  COMPARISON:  Previous exam(s).  ACR Breast Density Category c: The breasts are heterogeneously dense, which may obscure small masses.  FINDINGS: There are no findings suspicious for malignancy.  IMPRESSION: No mammographic evidence of malignancy. A result letter of this screening mammogram will be mailed directly to the patient.  RECOMMENDATION: Screening mammogram in one year.  (Code:SM-B-01Y)  BI-RADS CATEGORY  1: Negative.  Electronically Signed   By: Sande Brothers M.D.   On: 02/01/2023 10:22 Note: Reviewed        Physical Exam  General appearance: Well nourished, well developed, and well hydrated. In no apparent acute distress Mental status: Alert, oriented x 3 (person, place, & time)       Respiratory: No evidence of acute respiratory distress Eyes: PERLA Vitals: BP (!) 144/87   Pulse 74   Temp 97.8 F (36.6 C)   Ht 5\' 7"  (1.702 m)   Wt 150 lb (68 kg)   LMP 10/13/2016 (Within Days)   SpO2 100%   BMI 23.49 kg/m  BMI: Estimated body  mass index is 23.49 kg/m as calculated from the following:   Height as of this encounter: 5\' 7"  (1.702 m).   Weight as of this encounter: 150 lb (68 kg). Ideal: Ideal body weight: 61.6 kg (135 lb 12.9 oz) Adjusted ideal body weight: 64.2 kg (141 lb 7.7 oz)  Assessment   Diagnosis Status  1. Chronic pain syndrome   2. Pharmacologic therapy   3. Chronic hip pain (2ry area of Pain) (Right)   4. Encounter for medication management   5. Encounter for chronic pain management   6. Chronic wrist pain (3ry area of Pain) (Right) (CRPS)   7. Chronic use of opiate for therapeutic purpose   8. Complex regional pain syndrome type 1 of right upper extremity   9. Chronic low back pain (1ry area of Pain) (Bilateral) (R>L) w/o sciatica    Controlled Controlled Controlled   Updated Problems: No problems updated.  Plan of Care  Problem-specific:  No problem-specific Assessment & Plan notes found for this encounter.  [name] has a current medication list which includes the following long-term medication(s): hydrocodone-acetaminophen, levothyroxine, naloxone, venlafaxine xr, and hydrocodone-acetaminophen.  Pharmacotherapy (Medications Ordered): Meds ordered this encounter  Medications   DISCONTD: HYDROcodone-acetaminophen (NORCO) 10-325 MG tablet    Sig: Take 1 tablet by mouth every 4 (four) hours as  needed for severe pain. Must last 30 days    Dispense:  180 tablet    Refill:  0    DO NOT: delete (not duplicate); no partial-fill (will deny script to complete), no refill request (F/U required). DISPENSE: 1 day early if closed on fill date. WARN: No CNS-depressants within 8 hrs of med.   HYDROcodone-acetaminophen (NORCO) 10-325 MG tablet    Sig: Take 1 tablet by mouth every 4 (four) hours as needed for severe pain. Must last 30 days    Dispense:  180 tablet    Refill:  0    DO NOT: delete (not duplicate); no partial-fill (will deny script to complete), no refill request (F/U required). DISPENSE: 1 day early if closed on fill date. WARN: No CNS-depressants within 8 hrs of med.   Orders:  No orders of the defined types were placed in this encounter.  Follow-up plan:   Return in about 30 days (around 05/31/2023) for Eval-day (M,W), (F2F), (MM).      Interventional Therapies  Risk  Complexity Considerations:   Estimated body mass index is 21.93 kg/m as calculated from the following:   Height as of this encounter: 5\' 7"  (1.702 m).   Weight as of this encounter: 140 lb (63.5 kg). WNL   Planned  Pending:      Under consideration:   Diagnostic bilateral lumbar facet block  Diagnostic IA right hip injection    Completed:   None since before 2020.   Therapeutic  Palliative (PRN) options:   Palliative right stellate ganglion block  Management of Cervical spinal cord stimulator        Recent Visits No visits were found meeting these conditions. Showing recent visits within past 90 days and meeting all other requirements Today's Visits Date Type Provider Dept  05/01/23 Office Visit Delano Metz, MD [facility]  Showing today's visits and meeting all other requirements Future Appointments No visits were found meeting these conditions. Showing future appointments within next 90 days and meeting all other requirements  I discussed the assessment and treatment  plan with the patient. The patient was provided an opportunity to ask questions and  all were answered. The patient agreed with the plan and demonstrated an understanding of the instructions.  Patient advised to call back or seek an in-person evaluation if the symptoms or condition worsens.  Duration of encounter: 30 minutes.  Total time on encounter, as per AMA guidelines included both the face-to-face and non-face-to-face time personally spent by the physician and/or other qualified health care professional(s) on the day of the encounter (includes time in activities that require the physician or other qualified health care professional and does not include time in activities normally performed by clinical staff). Physician's time may include the following activities when performed: Preparing to see the patient (e.g., pre-charting review of records, searching for previously ordered imaging, lab work, and nerve conduction tests) Review of prior analgesic pharmacotherapies. Reviewing PMP Interpreting ordered tests (e.g., lab work, imaging, nerve conduction tests) Performing post-procedure evaluations, including interpretation of diagnostic procedures Obtaining and/or reviewing separately obtained history Performing a medically appropriate examination and/or evaluation Counseling and educating the patient/family/caregiver Ordering medications, tests, or procedures Referring and communicating with other health care professionals (when not separately reported) Documenting clinical information in the electronic or other health record Independently interpreting results (not separately reported) and communicating results to the patient/ family/caregiver Care coordination (not separately reported)  Note by: Oswaldo Done, MD Date: 05/01/2023; Time: 12:58 PM

## 2023-05-01 NOTE — Patient Instructions (Signed)
 ____________________________________________________________________________________________  Opioid Pain Medication Update  To: All patients taking opioid pain medications. (I.e.: hydrocodone, hydromorphone, oxycodone, oxymorphone, morphine, codeine, methadone, tapentadol, tramadol, buprenorphine, fentanyl, etc.)  Re: Updated review of side effects and adverse reactions of opioid analgesics, as well as new information about long term effects of this class of medications.  Direct risks of long-term opioid therapy are not limited to opioid addiction and overdose. Potential medical risks include serious fractures, breathing problems during sleep, hyperalgesia, immunosuppression, chronic constipation, bowel obstruction, myocardial infarction, and tooth decay secondary to xerostomia.  Unpredictable adverse effects that can occur even if you take your medication correctly: Cognitive impairment, respiratory depression, and death. Most people think that if they take their medication "correctly", and "as instructed", that they will be safe. Nothing could be farther from the truth. In reality, a significant amount of recorded deaths associated with the use of opioids has occurred in individuals that had taken the medication for a long time, and were taking their medication correctly. The following are examples of how this can happen: Patient taking his/her medication for a long time, as instructed, without any side effects, is given a certain antibiotic or another unrelated medication, which in turn triggers a "Drug-to-drug interaction" leading to disorientation, cognitive impairment, impaired reflexes, respiratory depression or an untoward event leading to serious bodily harm or injury, including death.  Patient taking his/her medication for a long time, as instructed, without any side effects, develops an acute impairment of liver and/or kidney function. This will lead to a rapid inability of the body to  breakdown and eliminate their pain medication, which will result in effects similar to an "overdose", but with the same medicine and dose that they had always taken. This again may lead to disorientation, cognitive impairment, impaired reflexes, respiratory depression or an untoward event leading to serious bodily harm or injury, including death.  A similar problem will occur with patients as they grow older and their liver and kidney function begins to decrease as part of the aging process.  Background information: Historically, the original case for using long-term opioid therapy to treat chronic noncancer pain was based on safety assumptions that subsequent experience has called into question. In 1996, the American Pain Society and the American Academy of Pain Medicine issued a consensus statement supporting long-term opioid therapy. This statement acknowledged the dangers of opioid prescribing but concluded that the risk for addiction was low; respiratory depression induced by opioids was short-lived, occurred mainly in opioid-naive patients, and was antagonized by pain; tolerance was not a common problem; and efforts to control diversion should not constrain opioid prescribing. This has now proven to be wrong. Experience regarding the risks for opioid addiction, misuse, and overdose in community practice has failed to support these assumptions.  According to the Centers for Disease Control and Prevention, fatal overdoses involving opioid analgesics have increased sharply over the past decade. Currently, more than 96,700 people die from drug overdoses every year. Opioids are a factor in 7 out of every 10 overdose deaths. Deaths from drug overdose have surpassed motor vehicle accidents as the leading cause of death for individuals between the ages of 66 and 14.  Clinical data suggest that neuroendocrine dysfunction may be very common in both men and women, potentially causing hypogonadism, erectile  dysfunction, infertility, decreased libido, osteoporosis, and depression. Recent studies linked higher opioid dose to increased opioid-related mortality. Controlled observational studies reported that long-term opioid therapy may be associated with increased risk for cardiovascular events. Subsequent meta-analysis concluded  that the safety of long-term opioid therapy in elderly patients has not been proven.   Side Effects and adverse reactions: Common side effects: Drowsiness (sedation). Dizziness. Nausea and vomiting. Constipation. Physical dependence -- Dependence often manifests with withdrawal symptoms when opioids are discontinued or decreased. Tolerance -- As you take repeated doses of opioids, you require increased medication to experience the same effect of pain relief. Respiratory depression -- This can occur in healthy people, especially with higher doses. However, people with COPD, asthma or other lung conditions may be even more susceptible to fatal respiratory impairment.  Uncommon side effects: An increased sensitivity to feeling pain and extreme response to pain (hyperalgesia). Chronic use of opioids can lead to this. Delayed gastric emptying (the process by which the contents of your stomach are moved into your small intestine). Muscle rigidity. Immune system and hormonal dysfunction. Quick, involuntary muscle jerks (myoclonus). Arrhythmia. Itchy skin (pruritus). Dry mouth (xerostomia).  Long-term side effects: Chronic constipation. Sleep-disordered breathing (SDB). Increased risk of bone fractures. Hypothalamic-pituitary-adrenal dysregulation. Increased risk of overdose.  RISKS: Respiratory depression and death: Opioids increase the risk of respiratory depression and death.  Drug-to-drug interactions: Opioids are relatively contraindicated in combination with benzodiazepines, sleep inducers, and other central nervous system depressants. Other classes of medications  (i.e.: certain antibiotics and even over-the-counter medications) may also trigger or induce respiratory depression in some patients.  Medical conditions: Patients with pre-existing respiratory problems are at higher risk of respiratory failure and/or depression when in combination with opioid analgesics. Opioids are relatively contraindicated in some medical conditions such as central sleep apnea.   Fractures and Falls:  Opioids increase the risk and incidence of falls. This is of particular importance in elderly patients.  Endocrine System:  Long-term administration is associated with endocrine abnormalities (endocrinopathies). (Also known as Opioid-induced Endocrinopathy) Influences on both the hypothalamic-pituitary-adrenal axis?and the hypothalamic-pituitary-gonadal axis have been demonstrated with consequent hypogonadism and adrenal insufficiency in both sexes. Hypogonadism and decreased levels of dehydroepiandrosterone sulfate have been reported in men and women. Endocrine effects include: Amenorrhoea in women (abnormal absence of menstruation) Reduced libido in both sexes Decreased sexual function Erectile dysfunction in men Hypogonadisms (decreased testicular function with shrinkage of testicles) Infertility Depression and fatigue Loss of muscle mass Anxiety Depression Immune suppression Hyperalgesia Weight gain Anemia Osteoporosis Patients (particularly women of childbearing age) should avoid opioids. There is insufficient evidence to recommend routine monitoring of asymptomatic patients taking opioids in the long-term for hormonal deficiencies.  Immune System: Human studies have demonstrated that opioids have an immunomodulating effect. These effects are mediated via opioid receptors both on immune effector cells and in the central nervous system. Opioids have been demonstrated to have adverse effects on antimicrobial response and anti-tumour surveillance. Buprenorphine has  been demonstrated to have no impact on immune function.  Opioid Induced Hyperalgesia: Human studies have demonstrated that prolonged use of opioids can lead to a state of abnormal pain sensitivity, sometimes called opioid induced hyperalgesia (OIH). Opioid induced hyperalgesia is not usually seen in the absence of tolerance to opioid analgesia. Clinically, hyperalgesia may be diagnosed if the patient on long-term opioid therapy presents with increased pain. This might be qualitatively and anatomically distinct from pain related to disease progression or to breakthrough pain resulting from development of opioid tolerance. Pain associated with hyperalgesia tends to be more diffuse than the pre-existing pain and less defined in quality. Management of opioid induced hyperalgesia requires opioid dose reduction.  Cancer: Chronic opioid therapy has been associated with an increased risk of cancer  among noncancer patients with chronic pain. This association was more evident in chronic strong opioid users. Chronic opioid consumption causes significant pathological changes in the small intestine and colon. Epidemiological studies have found that there is a link between opium dependence and initiation of gastrointestinal cancers. Cancer is the second leading cause of death after cardiovascular disease. Chronic use of opioids can cause multiple conditions such as GERD, immunosuppression and renal damage as well as carcinogenic effects, which are associated with the incidence of cancers.   Mortality: Long-term opioid use has been associated with increased mortality among patients with chronic non-cancer pain (CNCP).  Prescription of long-acting opioids for chronic noncancer pain was associated with a significantly increased risk of all-cause mortality, including deaths from causes other than overdose.  Reference: Von Korff M, Kolodny A, Deyo RA, Chou R. Long-term opioid therapy reconsidered. Ann Intern Med. 2011  Sep 6;155(5):325-8. doi: 10.7326/[id number]. PMID: 16109604; PMCID: [id number]. Randon Goldsmith, Hayward RA, Dunn KM, [place] KP. Risk of adverse events in patients prescribed long-term opioids: A cohort study in the [place] [facility] Research Datalink. Eur J Pain. 2019 May;23(5):908-922. doi: 10.1002/ejp.1357. Epub 2019 Jan 31. PMID: 78295621. Colameco S, Coren JS, Ci[place]o CA. Continuous opioid treatment for chronic noncancer pain: a time for moderation in prescribing. Postgrad Med. 2009 Jul;121(4):61-6. doi: 10.3810/pgm.2009.07.2032. PMID: 30865784. William Hamburger RN, [place], Blazina I, Cristopher Peru, Bougatsos C, Deyo RA. The effectiveness and risks of long-term opioid therapy for chronic pain: a systematic review for a [employer] of [employer] Pathways to [employer]. Ann Intern Med. 2015 Feb 17;162(4):276-86. doi: 10.7326/M14-2559. PMID: 69629528. Caryl Bis [facility], [place] DM. NCHS Data Brief No. 22. [place]: Centers for Disease Control and Prevention; 2009. Sep, Increase in Fatal Poisonings Involving Opioid Analgesics in the [place], 1999-2006. Song IA, Choi HR, Oh TK. Long-term opioid use and mortality in patients with chronic non-cancer pain: Ten-year follow-up study in [place] from 2010 through 2019. EClinicalMedicine. 2022 Jul 18;51:101558. doi: 10.1016/j.eclinm.2022.413244. PMID: 01027253; PMCID: [id number]. Huser, W., Schubert, T., Vogelmann, T. et al. All-cause mortality in patients with long-term opioid therapy compared with non-opioid analgesics for chronic non-cancer pain: a database study. BMC Med 18, 162 (2020). http://lester.info/ Rashidian H, Karie Kirks, Malekzadeh R, Haghdoost AA. An Ecological Study of the Association between Opiate Use and Incidence of Cancers. Addict Health. 2016 Fall;8(4):252-260. PMID: 59563875; PMCID: [id number].  Our Goal: Our goal is to control your  pain with means other than the use of opioid pain medications.  Our Recommendation: Talk to your physician about coming off of these medications. We can assist you with the tapering down and stopping these medicines. Based on the new information, even if you cannot completely stop the medication, a decrease in the dose may be associated with a lesser risk. Ask for other means of controlling the pain. Decrease or eliminate those factors that significantly contribute to your pain such as smoking, obesity, and a diet heavily tilted towards "inflammatory" nutrients.  Last Updated: 03/27/2023   ____________________________________________________________________________________________     ____________________________________________________________________________________________  National Pain Medication Shortage  The [place] is experiencing worsening drug shortages. These have had a negative widespread effect on patient care and treatment. Not expected to improve any time soon. Predicted to last past 2029.   Drug shortage list (generic names) Oxycodone IR Oxycodone/APAP Oxymorphone IR Hydromorphone Hydrocodone/APAP Morphine  Where is the problem?  Manufacturing and supply level.  Will this shortage affect you?  Only if you  take any of the above pain medications.  How? You may be unable to fill your prescription.  Your pharmacist may offer a "partial fill" of your prescription. (Warning: Do not accept partial fills.) Prescriptions partially filled cannot be transferred to another pharmacy. Read our Medication Rules and Regulation. Depending on how much medicine you are dependent on, you may experience withdrawals when unable to get the medication.  Recommendations: Consider ending your dependence on opioid pain medications. Ask your pain specialist to assist you with the process. Consider switching to a medication currently not in shortage, such as Buprenorphine. Talk to your pain  specialist about this option. Consider decreasing your pain medication requirements by managing tolerance thru "Drug Holidays". This may help minimize withdrawals, should you run out of medicine. Control your pain thru the use of non-pharmacological interventional therapies.   Your prescriber: Prescribers cannot be blamed for shortages. Medication manufacturing and supply issues cannot be fixed by the prescriber.   NOTE: The prescriber is not responsible for supplying the medication, or solving supply issues. Work with your pharmacist to solve it. The patient is responsible for the decision to take or continue taking the medication and for identifying and securing a legal supply source. By law, supplying the medication is the job and responsibility of the pharmacy. The prescriber is responsible for the evaluation, monitoring, and prescribing of these medications.   Prescribers will NOT: Re-issue prescriptions that have been partially filled. Re-issue prescriptions already sent to a pharmacy.  Re-send prescriptions to a different pharmacy because yours did not have your medication. Ask pharmacist to order more medicine or transfer the prescription to another pharmacy. (Read below.)  New 2023 regulation: "May 20, 2022 Revised Regulation Allows DEA-Registered Pharmacies to Transfer Electronic Prescriptions at a Patient's Request DEA Headquarters Division - Public Information Office Patients now have the ability to request their electronic prescription be transferred to another pharmacy without having to go back to their practitioner to initiate the request. This revised regulation went into effect on Monday, May 16, 2022.     At a patient's request, a DEA-registered retail pharmacy can now transfer an electronic prescription for a controlled substance (schedules II-V) to another DEA-registered retail pharmacy. Prior to this change, patients would have to go through their practitioner to  cancel their prescription and have it re-issued to a different pharmacy. The process was taxing and time consuming for both patients and practitioners.    The Drug Enforcement Administration [facility]) published its intent to revise the process for transferring electronic prescriptions on August 07, 2020.  The final rule was published in the federal register on April 14, 2022 and went into effect 30 days later.  Under the final rule, a prescription can only be transferred once between pharmacies, and only if allowed under existing state or other applicable law. The prescription must remain in its electronic form; may not be altered in any way; and the transfer must be communicated directly between two licensed pharmacists. It's important to note, any authorized refills transfer with the original prescription, which means the entire prescription will be filled at the same pharmacy".  Reference: [email or web address] [facility] website announcement)  [email or web address] [employer] of [employer])   [employer] / Vol. 88, No. 143 / Thursday, April 14, 2022 / Rules and Regulations DEPARTMENT OF JUSTICE  Drug Enforcement Administration  21 CFR Part 1306  [Docket No. [id number]]  RIN [id number] Transfer of Electronic Prescriptions for Schedules II-V Controlled Substances Between Pharmacies for Initial Filling  ____________________________________________________________________________________________  ____________________________________________________________________________________________  Transfer of Pain Medication between Pharmacies  Re: 2023 DEA Clarification on existing regulation  Published on DEA Website: May 20, 2022  Title: Revised Regulation Allows DEA-Registered Pharmacies to Electrical engineer Prescriptions at a Patient's  Request DEA Headquarters Division - [employer]  "Patients now have the ability to request their electronic prescription be transferred to another pharmacy without having to go back to their practitioner to initiate the request. This revised regulation went into effect on Monday, May 16, 2022.     At a patient's request, a DEA-registered retail pharmacy can now transfer an electronic prescription for a controlled substance (schedules II-V) to another DEA-registered retail pharmacy. Prior to this change, patients would have to go through their practitioner to cancel their prescription and have it re-issued to a different pharmacy. The process was taxing and time consuming for both patients and practitioners.    The Drug Enforcement Administration [facility]) published its intent to revise the process for transferring electronic prescriptions on August 07, 2020.  The final rule was published in the federal register on April 14, 2022 and went into effect 30 days later.  Under the final rule, a prescription can only be transferred once between pharmacies, and only if allowed under existing state or other applicable law. The prescription must remain in its electronic form; may not be altered in any way; and the transfer must be communicated directly between two licensed pharmacists. It's important to note, any authorized refills transfer with the original prescription, which means the entire prescription will be filled at the same pharmacy."    REFERENCES: 1. DEA website announcement [email or web address]  2. Department of Justice website  [email or web address]  3. DEPARTMENT OF JUSTICE Drug Enforcement Administration 21 CFR Part 1306 [Docket No. [id number]] RIN [id number] "Transfer of Electronic Prescriptions for Schedules II-V Controlled Substances  Between Pharmacies for Initial Filling"  ____________________________________________________________________________________________     _______________________________________________________________________  Medication Rules  Purpose: To inform patients, and their family members, of our medication rules and regulations.  Applies to: All patients receiving prescriptions from our practice (written or electronic).  Pharmacy of record: This is the pharmacy where your electronic prescriptions will be sent. Make sure we have the correct one.  Electronic prescriptions: In compliance with the [facility] Strengthen Opioid Misuse Prevention (STOP) Act of 2017 (Session Conni Elliot [phone]), effective September 19, 2018, all controlled substances must be electronically prescribed. Written prescriptions, faxing, or calling prescriptions to a pharmacy will no longer be done.  Prescription refills: These will be provided only during in-person appointments. No medications will be renewed without a "face-to-face" evaluation with your provider. Applies to all prescriptions.  NOTE: The following applies primarily to controlled substances (Opioid* Pain Medications).   Type of encounter (visit): For patients receiving controlled substances, face-to-face visits are required. (Not an option and not up to the patient.)  Patient's responsibilities: Pain Pills: Bring all pain pills to every appointment (except for procedure appointments). Pill Bottles: Bring pills in original pharmacy bottle. Bring bottle, even if empty. Always bring the bottle of the most recent fill.  Medication refills: You are responsible for knowing and keeping track of what medications you are taking and when is it that you will need a refill. The day before your appointment: write a list of all prescriptions that need to be refilled. The day of the appointment: give the list to the admitting nurse. Prescriptions will be written only  during appointments. No prescriptions will be written on procedure days. If you forget a  medication: it will not be "Called in", "Faxed", or "electronically sent". You will need to get another appointment to get these prescribed. No early refills. Do not call asking to have your prescription filled early. Partial  or short prescriptions: Occasionally your pharmacy may not have enough pills to fill your prescription.  NEVER ACCEPT a partial fill or a prescription that is short of the total amount of pills that you were prescribed.  With controlled substances the law allows 72 hours for the pharmacy to complete the prescription.  If the prescription is not completed within 72 hours, the pharmacist will require a new prescription to be written. This means that you will be short on your medicine and we WILL NOT send another prescription to complete your original prescription.  Instead, request the pharmacy to send a carrier to a nearby branch to get enough medication to provide you with your full prescription. Prescription Accuracy: You are responsible for carefully inspecting your prescriptions before leaving our office. Have the discharge nurse carefully go over each prescription with you, before taking them home. Make sure that your name is accurately spelled, that your address is correct. Check the name and dose of your medication to make sure it is accurate. Check the number of pills, and the written instructions to make sure they are clear and accurate. Make sure that you are given enough medication to last until your next medication refill appointment. Taking Medication: Take medication as prescribed. When it comes to controlled substances, taking less pills or less frequently than prescribed is permitted and encouraged. Never take more pills than instructed. Never take the medication more frequently than prescribed.  Inform other Doctors: Always inform, all of your healthcare providers, of all the  medications you take. Pain Medication from other Providers: You are not allowed to accept any additional pain medication from any other Doctor or Healthcare provider. There are two exceptions to this rule. (see below) In the event that you require additional pain medication, you are responsible for notifying us, as stated below. Cough Medicine: Often these contain an opioid, such as codeine or hydrocodone. Never accept or take cough medicine containing these opioids if you are already taking an opioid* medication. The combination may cause respiratory failure and death. Medication Agreement: You are responsible for carefully reading and following our Medication Agreement. This must be signed before receiving any prescriptions from our practice. Safely store a copy of your signed Agreement. Violations to the Agreement will result in no further prescriptions. (Additional copies of our Medication Agreement are available upon request.) Laws, Rules, & Regulations: All patients are expected to follow all [address] and [employer], [employer], Rules, [place] [place]. Ignorance of the Laws does not constitute a valid excuse.  Illegal drugs and Controlled Substances: The use of illegal substances (including, but not limited to marijuana and its derivatives) and/or the illegal use of any controlled substances is strictly prohibited. Violation of this rule may result in the immediate and permanent discontinuation of any and all prescriptions being written by our practice. The use of any illegal substances is prohibited. Adopted CDC guidelines & recommendations: Target dosing levels will be at or below 60 MME/day. Use of benzodiazepines** is not recommended.  Exceptions: There are only two exceptions to the rule of not receiving pain medications from other Healthcare Providers. Exception #1 (Emergencies): In the event of an emergency (i.e.: accident requiring emergency care), you are allowed to receive additional pain  medication. However, you are responsible for: As soon as  you are able, call our office [phone], at any time of the day or night, and leave a message stating your name, the date and nature of the emergency, and the name and dose of the medication prescribed. In the event that your call is answered by a member of our staff, make sure to document and save the date, time, and the name of the person that took your information.  Exception #2 (Planned Surgery): In the event that you are scheduled by another doctor or dentist to have any type of surgery or procedure, you are allowed (for a period no longer than 30 days), to receive additional pain medication, for the acute post-op pain. However, in this case, you are responsible for picking up a copy of our "Post-op Pain Management for Surgeons" handout, and giving it to your surgeon or dentist. This document is available at our office, and does not require an appointment to obtain it. Simply go to our office during business hours (Monday-Thursday from 8:00 AM to 4:00 PM) (Friday 8:00 AM to 12:00 Noon) or if you have a scheduled appointment with [place], prior to your surgery, and ask for it by name. In addition, you are responsible for: calling our office [phone], at any time of the day or night, and leaving a message stating your name, name of your surgeon, type of surgery, and date of procedure or surgery. Failure to comply with your responsibilities may result in termination of therapy involving the controlled substances. Medication Agreement Violation. Following the above rules, including your responsibilities will help you in avoiding a Medication Agreement Violation ("Breaking your Pain Medication Contract").  Consequences:  Not following the above rules may result in permanent discontinuation of medication prescription therapy.  *Opioid medications include: morphine, codeine, oxycodone, oxymorphone, hydrocodone, hydromorphone, meperidine, tramadol,  tapentadol, buprenorphine, fentanyl, methadone. **Benzodiazepine medications include: diazepam (Valium), alprazolam (Xanax), clonazepam (Klonopine), lorazepam (Ativan), clorazepate (Tranxene), chlordiazepoxide (Librium), estazolam (Prosom), oxazepam (Serax), temazepam (Restoril), triazolam (Halcion) (Last updated: 07/12/2022) ______________________________________________________________________    ______________________________________________________________________  Medication Recommendations and Reminders  Applies to: All patients receiving prescriptions (written and/or electronic).  Medication Rules & Regulations: You are responsible for reading, knowing, and following our "Medication Rules" document. These exist for your safety and that of others. They are not flexible and neither are we. Dismissing or ignoring them is an act of "non-compliance" that may result in complete and irreversible termination of such medication therapy. For safety reasons, "non-compliance" will not be tolerated. As with the [place] fundamental legal principle of "ignorance of the law is no defense", we will accept no excuses for not having read and knowing the content of documents provided to you by our practice.  Pharmacy of record:  Definition: This is the pharmacy where your electronic prescriptions will be sent.  We do not endorse any particular pharmacy. It is up to you and your insurance to decide what pharmacy to use.  We do not restrict you in your choice of pharmacy. However, once we write for your prescriptions, we will NOT be re-sending more prescriptions to fix restricted supply problems created by your pharmacy, or your insurance.  The pharmacy listed in the electronic medical record should be the one where you want electronic prescriptions to be sent. If you choose to change pharmacy, simply notify our nursing staff. Changes will be made only during your regular appointments and not over the  phone.  Recommendations: Keep all of your pain medications in a safe place, under lock and key, even  if you live alone. We will NOT replace lost, stolen, or damaged medication. We do not accept "Police Reports" as proof of medications having been stolen. After you fill your prescription, take 1 week's worth of pills and put them away in a safe place. You should keep a separate, properly labeled bottle for this purpose. The remainder should be kept in the original bottle. Use this as your primary supply, until it runs out. Once it's gone, then you know that you have 1 week's worth of medicine, and it is time to come in for a prescription refill. If you do this correctly, it is unlikely that you will ever run out of medicine. To make sure that the above recommendation works, it is very important that you make sure your medication refill appointments are scheduled at least 1 week before you run out of medicine. To do this in an effective manner, make sure that you do not leave the office without scheduling your next medication management appointment. Always ask the nursing staff to show you in your prescription , when your medication will be running out. Then arrange for the receptionist to get you a return appointment, at least 7 days before you run out of medicine. Do not wait until you have 1 or 2 pills left, to come in. This is very poor planning and does not take into consideration that we may need to cancel appointments due to bad weather, sickness, or emergencies affecting our staff. DO NOT ACCEPT A "Partial Fill": If for any reason your pharmacy does not have enough pills/tablets to completely fill or refill your prescription, do not allow for a "partial fill". The law allows the pharmacy to complete that prescription within 72 hours, without requiring a new prescription. If they do not fill the rest of your prescription within those 72 hours, you will need a separate prescription to fill the remaining  amount, which we will NOT provide. If the reason for the partial fill is your insurance, you will need to talk to the pharmacist about payment alternatives for the remaining tablets, but again, DO NOT ACCEPT A PARTIAL FILL, unless you can trust your pharmacist to obtain the remainder of the pills within 72 hours.  Prescription refills and/or changes in medication(s):  Prescription refills, and/or changes in dose or medication, will be conducted only during scheduled medication management appointments. (Applies to both, written and electronic prescriptions.) No refills on procedure days. No medication will be changed or started on procedure days. No changes, adjustments, and/or refills will be conducted on a procedure day. Doing so will interfere with the diagnostic portion of the procedure. No phone refills. No medications will be "called into the pharmacy". No Fax refills. No weekend refills. No Holliday refills. No after hours refills.  Remember:  Business hours are:  Monday to Thursday 8:00 AM to 4:00 PM Provider's Schedule: Delano Metz, MD - Appointments are:  Medication management: Monday and Wednesday 8:00 AM to 4:00 PM Procedure day: Tuesday and Thursday 7:30 AM to 4:00 PM Edward Jolly, MD - Appointments are:  Medication management: Tuesday and Thursday 8:00 AM to 4:00 PM Procedure day: Monday and Wednesday 7:30 AM to 4:00 PM (Last update: 07/12/2022) ______________________________________________________________________    ____________________________________________________________________________________________  Drug Holidays  What is a "Drug Holiday"? Drug Holiday: is the name given to the process of slowly tapering down and temporarily stopping the pain medication for the purpose of decreasing or eliminating tolerance to the drug.  Benefits Improved effectiveness Decreased required effective  dose Improved pain control End dependence on high dose  therapy Decrease cost of therapy Uncovering "opioid-induced hyperalgesia". (OIH)  What is "opioid hyperalgesia"? It is a paradoxical increase in pain caused by exposure to opioids. Stopping the opioid pain medication, contrary to the expected, it actually decreases or completely eliminates the pain. Ref.: "A comprehensive review of opioid-induced hyperalgesia". Donney Rankins, et.al. Pain Physician. 2011 Mar-Apr;14(2):145-61.  What is tolerance? Tolerance: the progressive loss of effectiveness of a pain medicine due to repetitive use. A common problem of opioid pain medications.  How long should a "Drug Holiday" last? Effectiveness depends on the patient staying off all opioid pain medicines for a minimum of 14 consecutive days. (2 weeks)  How about just taking less of the medicine? Does not work. Will not accomplish goal of eliminating the excess receptors.  How about switching to a different pain medicine? (AKA. "Opioid rotation") Does not work. Creates the illusion of effectiveness by taking advantage of inaccurate equivalent dose calculations between different opioids. -This "technique" was promoted by studies funded by [employer], such as [employer], creators of "OxyContin".  Can I stop the medicine "cold [place]"? We do not recommend it. You should always coordinate with your prescribing physician to make the transition as smoothly as possible. Avoid stopping the medicine abruptly without consulting. We recommend a "slow taper".  What is a slow taper? Taper: refers to the gradual decrease in dose.   How do I stop/taper the dose? Slowly. Decrease the daily amount of pills that you take by one (1) pill every seven (7) days. This is called a "slow downward taper". Example: if you normally take four (4) pills per day, drop it to three (3) pills per day for seven (7) days, then to two (2) pills per day for seven (7) days, then to one (1) per day for seven (7) days, and then  stop the medicine. The 14 day "Drug Holiday" starts on the first day without medicine.   Will I experience withdrawals? Unlikely with a slow taper.  What triggers withdrawals? Withdrawals are triggered by the sudden/abrupt stop of high dose opioids. Withdrawals can be avoided by slowly decreasing the dose over a prolonged period of time.  What are withdrawals? Symptoms associated with sudden/abrupt reduction/stopping of high-dose, long-term use of pain medication. Withdrawal are seldom seen on low dose therapy, or patients rarely taking opioid medication.  Early Withdrawal Symptoms may include: Agitation Anxiety Muscle aches Increased tearing Insomnia Runny nose Sweating Yawning  Late symptoms may include: Abdominal cramping Diarrhea Dilated pupils Goose bumps Nausea Vomiting  When could I see withdrawals? Onset: 8-24 hours after last use for most opioids. 12-48 hours for long-acting opioids (i.e.: methadone)  How long could they last? Duration: 4-10 days for most opioids. 14-21 days for long-acting opioids (i.e.: methadone)  What will happen after I complete my "Drug Holiday"? The need and indications for the opioid analgesic will be reviewed before restarting the medication. Dose requirements will likely decrease and the dose will need to be adjusted accordingly.   (Last update: 12/07/2022) ____________________________________________________________________________________________   ____________________________________________________________________________________________  Naloxone Nasal Spray  Why am I receiving this medication? [place] STOP ACT requires that all patients taking high dose opioids or at risk of opioids respiratory depression, be prescribed an opioid reversal agent, such as Naloxone (AKA: Narcan).  What is this medication? NALOXONE (nal OX one) treats opioid overdose, which causes slow or shallow breathing, severe drowsiness, or trouble  staying awake. Call emergency services after using  this medication. You may need additional treatment. Naloxone works by reversing the effects of opioids. It belongs to a group of medications called opioid blockers.  COMMON BRAND NAME(S): Kloxxado, Narcan  What should I tell my care team before I take this medication? They need to know if you have any of these conditions: Heart disease Substance use disorder An unusual or allergic reaction to naloxone, other medications, foods, dyes, or preservatives Pregnant or trying to get pregnant Breast-feeding  When to use this medication? This medication is to be used for the treatment of respiratory depression (less than 8 breaths per minute) secondary to opioid overdose.   How to use this medication? This medication is for use in the nose. Lay the person on their back. Support their neck with your hand and allow the head to tilt back before giving the medication. The nasal spray should be given into 1 nostril. After giving the medication, move the person onto their side. Do not remove or test the nasal spray until ready to use. Get emergency medical help right away after giving the first dose of this medication, even if the person wakes up. You should be familiar with how to recognize the signs and symptoms of a narcotic overdose. If more doses are needed, give the additional dose in the other nostril. Talk to your care team about the use of this medication in children. While this medication may be prescribed for children as young as newborns for selected conditions, precautions do apply.  Naloxone Overdosage: If you think you have taken too much of this medicine contact a poison control center or emergency room at once.  NOTE: This medicine is only for you. Do not share this medicine with others.  What if I miss a dose? This does not apply.  What may interact with this medication? This is only used during an emergency. No interactions are  expected during emergency use. This list may not describe all possible interactions. Give your health care provider a list of all the medicines, herbs, non-prescription drugs, or dietary supplements you use. Also tell them if you smoke, drink alcohol, or use illegal drugs. Some items may interact with your medicine.  What should I watch for while using this medication? Keep this medication ready for use in the case of an opioid overdose. Make sure that you have the phone number of your care team and local hospital ready. You may need to have additional doses of this medication. Each nasal spray contains a single dose. Some emergencies may require additional doses. After use, bring the treated person to the nearest hospital or call 911. Make sure the treating care team knows that the person has received a dose of this medication. You will receive additional instructions on what to do during and after use of this medication before an emergency occurs.  What side effects may I notice from receiving this medication? Side effects that you should report to your care team as soon as possible: Allergic reactions--skin rash, itching, hives, swelling of the face, lips, tongue, or throat Side effects that usually do not require medical attention (report these to your care team if they continue or are bothersome): Constipation Dryness or irritation inside the nose Headache Increase in blood pressure Muscle spasms Stuffy nose Toothache This list may not describe all possible side effects. Call your doctor for medical advice about side effects. You may report side effects to FDA at 1-800-FDA-1088.  Where should I keep my medication? Because this is  an emergency medication, you should keep it with you at all times.  Keep out of the reach of children and pets. Store between 20 and 25 degrees C (68 and 77 degrees F). Do not freeze. Throw away any unused medication after the expiration date. Keep in original  box until ready to use.  NOTE: This sheet is a summary. It may not cover all possible information. If you have questions about this medicine, talk to your doctor, pharmacist, or health care provider.   2023 Elsevier/Gold Standard (2021-05-14 00:00:00)  ____________________________________________________________________________________________

## 2023-05-01 NOTE — Progress Notes (Signed)
Safety precautions to be maintained throughout the outpatient stay will include: orient to surroundings, keep bed in low position, maintain call bell within reach at all times, provide assistance with transfer out of bed and ambulation. Safety precautions to be maintained throughout the outpatient stay will include: orient to surroundings, keep bed in low position, maintain call bell within reach at all times, provide assistance with transfer out of bed and ambulation.   Nursing Pain Medication Assessment:  Safety precautions to be maintained throughout the outpatient stay will include: orient to surroundings, keep bed in low position, maintain call bell within reach at all times, provide assistance with transfer out of bed and ambulation.  Medication Inspection Compliance: Wendy Snyder did not comply with our request to bring her pills to be counted. She was reminded that bringing the medication bottles, even when empty, is a requirement.  Medication: Hydrocodone/APAP Pill/Patch Count: No pills available to be counted. Pill/Patch Appearance:  did not bring pills Bottle Appearance: No container available. Did not bring bottle(s) to appointment. Filled Date: did not bring pills / did not bring pills / 2024 Last Medication intake:  Ran out of medicine more than 48 hours ago  Called [facility] to only keep Rx from today and cancel all previous ones per MD order

## 2023-05-18 ENCOUNTER — Other Ambulatory Visit: Payer: Self-pay | Admitting: Internal Medicine

## 2023-05-18 DIAGNOSIS — F064 Anxiety disorder due to known physiological condition: Secondary | ICD-10-CM

## 2023-05-18 NOTE — Patient Instructions (Signed)
 ____________________________________________________________________________________________  Opioid Pain Medication Update  To: All patients taking opioid pain medications. (I.e.: hydrocodone, hydromorphone, oxycodone, oxymorphone, morphine, codeine, methadone, tapentadol, tramadol, buprenorphine, fentanyl, etc.)  Re: Updated review of side effects and adverse reactions of opioid analgesics, as well as new information about long term effects of this class of medications.  Direct risks of long-term opioid therapy are not limited to opioid addiction and overdose. Potential medical risks include serious fractures, breathing problems during sleep, hyperalgesia, immunosuppression, chronic constipation, bowel obstruction, myocardial infarction, and tooth decay secondary to xerostomia.  Unpredictable adverse effects that can occur even if you take your medication correctly: Cognitive impairment, respiratory depression, and death. Most people think that if they take their medication "correctly", and "as instructed", that they will be safe. Nothing could be farther from the truth. In reality, a significant amount of recorded deaths associated with the use of opioids has occurred in individuals that had taken the medication for a long time, and were taking their medication correctly. The following are examples of how this can happen: Patient taking his/her medication for a long time, as instructed, without any side effects, is given a certain antibiotic or another unrelated medication, which in turn triggers a "Drug-to-drug interaction" leading to disorientation, cognitive impairment, impaired reflexes, respiratory depression or an untoward event leading to serious bodily harm or injury, including death.  Patient taking his/her medication for a long time, as instructed, without any side effects, develops an acute impairment of liver and/or kidney function. This will lead to a rapid inability of the body to  breakdown and eliminate their pain medication, which will result in effects similar to an "overdose", but with the same medicine and dose that they had always taken. This again may lead to disorientation, cognitive impairment, impaired reflexes, respiratory depression or an untoward event leading to serious bodily harm or injury, including death.  A similar problem will occur with patients as they grow older and their liver and kidney function begins to decrease as part of the aging process.  Background information: Historically, the original case for using long-term opioid therapy to treat chronic noncancer pain was based on safety assumptions that subsequent experience has called into question. In 1996, the American Pain Society and the American Academy of Pain Medicine issued a consensus statement supporting long-term opioid therapy. This statement acknowledged the dangers of opioid prescribing but concluded that the risk for addiction was low; respiratory depression induced by opioids was short-lived, occurred mainly in opioid-naive patients, and was antagonized by pain; tolerance was not a common problem; and efforts to control diversion should not constrain opioid prescribing. This has now proven to be wrong. Experience regarding the risks for opioid addiction, misuse, and overdose in community practice has failed to support these assumptions.  According to the Centers for Disease Control and Prevention, fatal overdoses involving opioid analgesics have increased sharply over the past decade. Currently, more than 96,700 people die from drug overdoses every year. Opioids are a factor in 7 out of every 10 overdose deaths. Deaths from drug overdose have surpassed motor vehicle accidents as the leading cause of death for individuals between the ages of 80 and 61.  Clinical data suggest that neuroendocrine dysfunction may be very common in both men and women, potentially causing hypogonadism, erectile  dysfunction, infertility, decreased libido, osteoporosis, and depression. Recent studies linked higher opioid dose to increased opioid-related mortality. Controlled observational studies reported that long-term opioid therapy may be associated with increased risk for cardiovascular events. Subsequent meta-analysis concluded  that the safety of long-term opioid therapy in elderly patients has not been proven.   Side Effects and adverse reactions: Common side effects: Drowsiness (sedation). Dizziness. Nausea and vomiting. Constipation. Physical dependence -- Dependence often manifests with withdrawal symptoms when opioids are discontinued or decreased. Tolerance -- As you take repeated doses of opioids, you require increased medication to experience the same effect of pain relief. Respiratory depression -- This can occur in healthy people, especially with higher doses. However, people with COPD, asthma or other lung conditions may be even more susceptible to fatal respiratory impairment.  Uncommon side effects: An increased sensitivity to feeling pain and extreme response to pain (hyperalgesia). Chronic use of opioids can lead to this. Delayed gastric emptying (the process by which the contents of your stomach are moved into your small intestine). Muscle rigidity. Immune system and hormonal dysfunction. Quick, involuntary muscle jerks (myoclonus). Arrhythmia. Itchy skin (pruritus). Dry mouth (xerostomia).  Long-term side effects: Chronic constipation. Sleep-disordered breathing (SDB). Increased risk of bone fractures. Hypothalamic-pituitary-adrenal dysregulation. Increased risk of overdose.  RISKS: Respiratory depression and death: Opioids increase the risk of respiratory depression and death.  Drug-to-drug interactions: Opioids are relatively contraindicated in combination with benzodiazepines, sleep inducers, and other central nervous system depressants. Other classes of medications  (i.e.: certain antibiotics and even over-the-counter medications) may also trigger or induce respiratory depression in some patients.  Medical conditions: Patients with pre-existing respiratory problems are at higher risk of respiratory failure and/or depression when in combination with opioid analgesics. Opioids are relatively contraindicated in some medical conditions such as central sleep apnea.   Fractures and Falls:  Opioids increase the risk and incidence of falls. This is of particular importance in elderly patients.  Endocrine System:  Long-term administration is associated with endocrine abnormalities (endocrinopathies). (Also known as Opioid-induced Endocrinopathy) Influences on both the hypothalamic-pituitary-adrenal axis?and the hypothalamic-pituitary-gonadal axis have been demonstrated with consequent hypogonadism and adrenal insufficiency in both sexes. Hypogonadism and decreased levels of dehydroepiandrosterone sulfate have been reported in men and women. Endocrine effects include: Amenorrhoea in women (abnormal absence of menstruation) Reduced libido in both sexes Decreased sexual function Erectile dysfunction in men Hypogonadisms (decreased testicular function with shrinkage of testicles) Infertility Depression and fatigue Loss of muscle mass Anxiety Depression Immune suppression Hyperalgesia Weight gain Anemia Osteoporosis Patients (particularly women of childbearing age) should avoid opioids. There is insufficient evidence to recommend routine monitoring of asymptomatic patients taking opioids in the long-term for hormonal deficiencies.  Immune System: Human studies have demonstrated that opioids have an immunomodulating effect. These effects are mediated via opioid receptors both on immune effector cells and in the central nervous system. Opioids have been demonstrated to have adverse effects on antimicrobial response and anti-tumour surveillance. Buprenorphine has  been demonstrated to have no impact on immune function.  Opioid Induced Hyperalgesia: Human studies have demonstrated that prolonged use of opioids can lead to a state of abnormal pain sensitivity, sometimes called opioid induced hyperalgesia (OIH). Opioid induced hyperalgesia is not usually seen in the absence of tolerance to opioid analgesia. Clinically, hyperalgesia may be diagnosed if the patient on long-term opioid therapy presents with increased pain. This might be qualitatively and anatomically distinct from pain related to disease progression or to breakthrough pain resulting from development of opioid tolerance. Pain associated with hyperalgesia tends to be more diffuse than the pre-existing pain and less defined in quality. Management of opioid induced hyperalgesia requires opioid dose reduction.  Cancer: Chronic opioid therapy has been associated with an increased risk of cancer  among noncancer patients with chronic pain. This association was more evident in chronic strong opioid users. Chronic opioid consumption causes significant pathological changes in the small intestine and colon. Epidemiological studies have found that there is a link between opium dependence and initiation of gastrointestinal cancers. Cancer is the second leading cause of death after cardiovascular disease. Chronic use of opioids can cause multiple conditions such as GERD, immunosuppression and renal damage as well as carcinogenic effects, which are associated with the incidence of cancers.   Mortality: Long-term opioid use has been associated with increased mortality among patients with chronic non-cancer pain (CNCP).  Prescription of long-acting opioids for chronic noncancer pain was associated with a significantly increased risk of all-cause mortality, including deaths from causes other than overdose.  Reference: Von Korff M, Kolodny A, Deyo RA, Chou R. Long-term opioid therapy reconsidered. Ann Intern Med. 2011  Sep 6;155(5):325-8. doi: 10.7326/[id number]. PMID: 64403474; PMCID: [id number]. Randon Goldsmith, Hayward RA, Dunn KM, [place] KP. Risk of adverse events in patients prescribed long-term opioids: A cohort study in the [place] [facility] Research Datalink. Eur J Pain. 2019 May;23(5):908-922. doi: 10.1002/ejp.1357. Epub 2019 Jan 31. PMID: 43329518. Colameco S, Coren JS, Ciervo CA. Continuous opioid treatment for chronic noncancer pain: a time for moderation in prescribing. Postgrad Med. 2009 Jul;121(4):61-6. doi: 10.3810/pgm.2009.07.2032. PMID: 84166063. William Hamburger RN, [place] SD, Blazina I, Cristopher Peru, Bougatsos C, Deyo RA. The effectiveness and risks of long-term opioid therapy for chronic pain: a systematic review for a [employer] of [employer] Pathways to [employer]. Ann Intern Med. 2015 Feb 17;162(4):276-86. doi: 10.7326/M14-2559. PMID: 01601093. Caryl Bis [facility], Makuc DM. NCHS Data Brief No. 22. [place]: Centers for Disease Control and Prevention; 2009. Sep, Increase in Fatal Poisonings Involving Opioid Analgesics in the [place], 1999-2006. Song IA, Choi HR, Oh TK. Long-term opioid use and mortality in patients with chronic non-cancer pain: Ten-year follow-up study in [place] from 2010 through 2019. EClinicalMedicine. 2022 Jul 18;51:101558. doi: 10.1016/j.eclinm.2022.235573. PMID: 22025427; PMCID: [id number]. Huser, W., Schubert, T., Vogelmann, T. et al. All-cause mortality in patients with long-term opioid therapy compared with non-opioid analgesics for chronic non-cancer pain: a database study. BMC Med 18, 162 (2020). http://lester.info/ Rashidian H, Karie Kirks, Malekzadeh R, Haghdoost AA. An Ecological Study of the Association between Opiate Use and Incidence of Cancers. Addict Health. 2016 Fall;8(4):252-260. PMID: 51761607; PMCID: [id number].  Our Goal: Our goal is to control your  pain with means other than the use of opioid pain medications.  Our Recommendation: Talk to your physician about coming off of these medications. We can assist you with the tapering down and stopping these medicines. Based on the new information, even if you cannot completely stop the medication, a decrease in the dose may be associated with a lesser risk. Ask for other means of controlling the pain. Decrease or eliminate those factors that significantly contribute to your pain such as smoking, obesity, and a diet heavily tilted towards "inflammatory" nutrients.  Last Updated: 03/27/2023   ____________________________________________________________________________________________     ____________________________________________________________________________________________  National Pain Medication Shortage  The [place] is experiencing worsening drug shortages. These have had a negative widespread effect on patient care and treatment. Not expected to improve any time soon. Predicted to last past 2029.   Drug shortage list (generic names) Oxycodone IR Oxycodone/APAP Oxymorphone IR Hydromorphone Hydrocodone/APAP Morphine  Where is the problem?  Manufacturing and supply level.  Will this shortage affect you?  Only if you  take any of the above pain medications.  How? You may be unable to fill your prescription.  Your pharmacist may offer a "partial fill" of your prescription. (Warning: Do not accept partial fills.) Prescriptions partially filled cannot be transferred to another pharmacy. Read our Medication Rules and Regulation. Depending on how much medicine you are dependent on, you may experience withdrawals when unable to get the medication.  Recommendations: Consider ending your dependence on opioid pain medications. Ask your pain specialist to assist you with the process. Consider switching to a medication currently not in shortage, such as Buprenorphine. Talk to your pain  specialist about this option. Consider decreasing your pain medication requirements by managing tolerance thru "Drug Holidays". This may help minimize withdrawals, should you run out of medicine. Control your pain thru the use of non-pharmacological interventional therapies.   Your prescriber: Prescribers cannot be blamed for shortages. Medication manufacturing and supply issues cannot be fixed by the prescriber.   NOTE: The prescriber is not responsible for supplying the medication, or solving supply issues. Work with your pharmacist to solve it. The patient is responsible for the decision to take or continue taking the medication and for identifying and securing a legal supply source. By law, supplying the medication is the job and responsibility of the pharmacy. The prescriber is responsible for the evaluation, monitoring, and prescribing of these medications.   Prescribers will NOT: Re-issue prescriptions that have been partially filled. Re-issue prescriptions already sent to a pharmacy.  Re-send prescriptions to a different pharmacy because yours did not have your medication. Ask pharmacist to order more medicine or transfer the prescription to another pharmacy. (Read below.)  New 2023 regulation: "May 20, 2022 Revised Regulation Allows DEA-Registered Pharmacies to Transfer Electronic Prescriptions at a Patient's Request DEA Headquarters Division - Public Information Office Patients now have the ability to request their electronic prescription be transferred to another pharmacy without having to go back to their practitioner to initiate the request. This revised regulation went into effect on Monday, May 16, 2022.     At a patient's request, a DEA-registered retail pharmacy can now transfer an electronic prescription for a controlled substance (schedules II-V) to another DEA-registered retail pharmacy. Prior to this change, patients would have to go through their practitioner to  cancel their prescription and have it re-issued to a different pharmacy. The process was taxing and time consuming for both patients and practitioners.    The Drug Enforcement Administration [facility]) published its intent to revise the process for transferring electronic prescriptions on August 07, 2020.  The final rule was published in the federal register on April 14, 2022 and went into effect 30 days later.  Under the final rule, a prescription can only be transferred once between pharmacies, and only if allowed under existing state or other applicable law. The prescription must remain in its electronic form; may not be altered in any way; and the transfer must be communicated directly between two licensed pharmacists. It's important to note, any authorized refills transfer with the original prescription, which means the entire prescription will be filled at the same pharmacy".  Reference: [email or web address].is [facility] website announcement)  [email or web address] [employer] of Justice)   [employer] / Vol. 88, No. 143 / Thursday, April 14, 2022 / Rules and Regulations DEPARTMENT OF JUSTICE  Drug Enforcement Administration  21 CFR Part 1306  [Docket No. [id number]]  RIN [id number] Transfer of Electronic Prescriptions for Schedules II-V Controlled Substances Between Pharmacies for Initial Filling  ____________________________________________________________________________________________  ____________________________________________________________________________________________  Transfer of Pain Medication between Pharmacies  Re: 2023 DEA Clarification on existing regulation  Published on DEA Website: May 20, 2022  Title: Revised Regulation Allows DEA-Registered Pharmacies to Electrical engineer Prescriptions at a Patient's  Request DEA Headquarters Division - [employer]  "Patients now have the ability to request their electronic prescription be transferred to another pharmacy without having to go back to their practitioner to initiate the request. This revised regulation went into effect on Monday, May 16, 2022.     At a patient's request, a DEA-registered retail pharmacy can now transfer an electronic prescription for a controlled substance (schedules II-V) to another DEA-registered retail pharmacy. Prior to this change, patients would have to go through their practitioner to cancel their prescription and have it re-issued to a different pharmacy. The process was taxing and time consuming for both patients and practitioners.    The Drug Enforcement Administration [facility]) published its intent to revise the process for transferring electronic prescriptions on August 07, 2020.  The final rule was published in the federal register on April 14, 2022 and went into effect 30 days later.  Under the final rule, a prescription can only be transferred once between pharmacies, and only if allowed under existing state or other applicable law. The prescription must remain in its electronic form; may not be altered in any way; and the transfer must be communicated directly between two licensed pharmacists. It's important to note, any authorized refills transfer with the original prescription, which means the entire prescription will be filled at the same pharmacy."    REFERENCES: 1. DEA website announcement [email or web address]  2. Department of Justice website  [email or web address]  3. DEPARTMENT OF JUSTICE Drug Enforcement Administration 21 CFR Part 1306 [Docket No. [id number]] RIN [id number] "Transfer of Electronic Prescriptions for Schedules II-V Controlled Substances  Between Pharmacies for Initial Filling"  ____________________________________________________________________________________________     _______________________________________________________________________  Medication Rules  Purpose: To inform patients, and their family members, of our medication rules and regulations.  Applies to: All patients receiving prescriptions from our practice (written or electronic).  Pharmacy of record: This is the pharmacy where your electronic prescriptions will be sent. Make sure we have the correct one.  Electronic prescriptions: In compliance with the [facility] Strengthen Opioid Misuse Prevention (STOP) Act of 2017 (Session Conni Elliot [phone]), effective September 19, 2018, all controlled substances must be electronically prescribed. Written prescriptions, faxing, or calling prescriptions to a pharmacy will no longer be done.  Prescription refills: These will be provided only during in-person appointments. No medications will be renewed without a "face-to-face" evaluation with your provider. Applies to all prescriptions.  NOTE: The following applies primarily to controlled substances (Opioid* Pain Medications).   Type of encounter (visit): For patients receiving controlled substances, face-to-face visits are required. (Not an option and not up to the patient.)  Patient's responsibilities: Pain Pills: Bring all pain pills to every appointment (except for procedure appointments). Pill Bottles: Bring pills in original pharmacy bottle. Bring bottle, even if empty. Always bring the bottle of the most recent fill.  Medication refills: You are responsible for knowing and keeping track of what medications you are taking and when is it that you will need a refill. The day before your appointment: write a list of all prescriptions that need to be refilled. The day of the appointment: give the list to the admitting nurse. Prescriptions will be written only  during appointments. No prescriptions will be written on procedure days. If you forget a  medication: it will not be "Called in", "Faxed", or "electronically sent". You will need to get another appointment to get these prescribed. No early refills. Do not call asking to have your prescription filled early. Partial  or short prescriptions: Occasionally your pharmacy may not have enough pills to fill your prescription.  NEVER ACCEPT a partial fill or a prescription that is short of the total amount of pills that you were prescribed.  With controlled substances the law allows 72 hours for the pharmacy to complete the prescription.  If the prescription is not completed within 72 hours, the pharmacist will require a new prescription to be written. This means that you will be short on your medicine and we WILL NOT send another prescription to complete your original prescription.  Instead, request the pharmacy to send a carrier to a nearby branch to get enough medication to provide you with your full prescription. Prescription Accuracy: You are responsible for carefully inspecting your prescriptions before leaving our office. Have the discharge nurse carefully go over each prescription with you, before taking them home. Make sure that your name is accurately spelled, that your address is correct. Check the name and dose of your medication to make sure it is accurate. Check the number of pills, and the written instructions to make sure they are clear and accurate. Make sure that you are given enough medication to last until your next medication refill appointment. Taking Medication: Take medication as prescribed. When it comes to controlled substances, taking less pills or less frequently than prescribed is permitted and encouraged. Never take more pills than instructed. Never take the medication more frequently than prescribed.  Inform other Doctors: Always inform, all of your healthcare providers, of all the  medications you take. Pain Medication from other Providers: You are not allowed to accept any additional pain medication from any other Doctor or Healthcare provider. There are two exceptions to this rule. (see below) In the event that you require additional pain medication, you are responsible for notifying us, as stated below. Cough Medicine: Often these contain an opioid, such as codeine or hydrocodone. Never accept or take cough medicine containing these opioids if you are already taking an opioid* medication. The combination may cause respiratory failure and death. Medication Agreement: You are responsible for carefully reading and following our Medication Agreement. This must be signed before receiving any prescriptions from our practice. Safely store a copy of your signed Agreement. Violations to the Agreement will result in no further prescriptions. (Additional copies of our Medication Agreement are available upon request.) Laws, Rules, & Regulations: All patients are expected to follow all [address] and [employer], [employer], Rules, [place] [employer]. Ignorance of the Laws does not constitute a valid excuse.  Illegal drugs and Controlled Substances: The use of illegal substances (including, but not limited to marijuana and its derivatives) and/or the illegal use of any controlled substances is strictly prohibited. Violation of this rule may result in the immediate and permanent discontinuation of any and all prescriptions being written by our practice. The use of any illegal substances is prohibited. Adopted CDC guidelines & recommendations: Target dosing levels will be at or below 60 MME/day. Use of benzodiazepines** is not recommended.  Exceptions: There are only two exceptions to the rule of not receiving pain medications from other Healthcare Providers. Exception #1 (Emergencies): In the event of an emergency (i.e.: accident requiring emergency care), you are allowed to receive additional pain  medication. However, you are responsible for: As soon as  you are able, call our office [phone], at any time of the day or night, and leave a message stating your name, the date and nature of the emergency, and the name and dose of the medication prescribed. In the event that your call is answered by a member of our staff, make sure to document and save the date, time, and the name of the person that took your information.  Exception #2 (Planned Surgery): In the event that you are scheduled by another doctor or dentist to have any type of surgery or procedure, you are allowed (for a period no longer than 30 days), to receive additional pain medication, for the acute post-op pain. However, in this case, you are responsible for picking up a copy of our "Post-op Pain Management for Surgeons" handout, and giving it to your surgeon or dentist. This document is available at our office, and does not require an appointment to obtain it. Simply go to our office during business hours (Monday-Thursday from 8:00 AM to 4:00 PM) (Friday 8:00 AM to 12:00 Noon) or if you have a scheduled appointment with [place], prior to your surgery, and ask for it by name. In addition, you are responsible for: calling our office [phone], at any time of the day or night, and leaving a message stating your name, name of your surgeon, type of surgery, and date of procedure or surgery. Failure to comply with your responsibilities may result in termination of therapy involving the controlled substances. Medication Agreement Violation. Following the above rules, including your responsibilities will help you in avoiding a Medication Agreement Violation ("Breaking your Pain Medication Contract").  Consequences:  Not following the above rules may result in permanent discontinuation of medication prescription therapy.  *Opioid medications include: morphine, codeine, oxycodone, oxymorphone, hydrocodone, hydromorphone, meperidine, tramadol,  tapentadol, buprenorphine, fentanyl, methadone. **Benzodiazepine medications include: diazepam (Valium), alprazolam (Xanax), clonazepam (Klonopine), lorazepam (Ativan), clorazepate (Tranxene), chlordiazepoxide (Librium), estazolam (Prosom), oxazepam (Serax), temazepam (Restoril), triazolam (Halcion) (Last updated: 07/12/2022) ______________________________________________________________________    ______________________________________________________________________  Medication Recommendations and Reminders  Applies to: All patients receiving prescriptions (written and/or electronic).  Medication Rules & Regulations: You are responsible for reading, knowing, and following our "Medication Rules" document. These exist for your safety and that of others. They are not flexible and neither are we. Dismissing or ignoring them is an act of "non-compliance" that may result in complete and irreversible termination of such medication therapy. For safety reasons, "non-compliance" will not be tolerated. As with the [place] fundamental legal principle of "ignorance of the law is no defense", we will accept no excuses for not having read and knowing the content of documents provided to you by our practice.  Pharmacy of record:  Definition: This is the pharmacy where your electronic prescriptions will be sent.  We do not endorse any particular pharmacy. It is up to you and your insurance to decide what pharmacy to use.  We do not restrict you in your choice of pharmacy. However, once we write for your prescriptions, we will NOT be re-sending more prescriptions to fix restricted supply problems created by your pharmacy, or your insurance.  The pharmacy listed in the electronic medical record should be the one where you want electronic prescriptions to be sent. If you choose to change pharmacy, simply notify our nursing staff. Changes will be made only during your regular appointments and not over the  phone.  Recommendations: Keep all of your pain medications in a safe place, under lock and key, even  if you live alone. We will NOT replace lost, stolen, or damaged medication. We do not accept "Police Reports" as proof of medications having been stolen. After you fill your prescription, take 1 week's worth of pills and put them away in a safe place. You should keep a separate, properly labeled bottle for this purpose. The remainder should be kept in the original bottle. Use this as your primary supply, until it runs out. Once it's gone, then you know that you have 1 week's worth of medicine, and it is time to come in for a prescription refill. If you do this correctly, it is unlikely that you will ever run out of medicine. To make sure that the above recommendation works, it is very important that you make sure your medication refill appointments are scheduled at least 1 week before you run out of medicine. To do this in an effective manner, make sure that you do not leave the office without scheduling your next medication management appointment. Always ask the nursing staff to show you in your prescription , when your medication will be running out. Then arrange for the receptionist to get you a return appointment, at least 7 days before you run out of medicine. Do not wait until you have 1 or 2 pills left, to come in. This is very poor planning and does not take into consideration that we may need to cancel appointments due to bad weather, sickness, or emergencies affecting our staff. DO NOT ACCEPT A "Partial Fill": If for any reason your pharmacy does not have enough pills/tablets to completely fill or refill your prescription, do not allow for a "partial fill". The law allows the pharmacy to complete that prescription within 72 hours, without requiring a new prescription. If they do not fill the rest of your prescription within those 72 hours, you will need a separate prescription to fill the remaining  amount, which we will NOT provide. If the reason for the partial fill is your insurance, you will need to talk to the pharmacist about payment alternatives for the remaining tablets, but again, DO NOT ACCEPT A PARTIAL FILL, unless you can trust your pharmacist to obtain the remainder of the pills within 72 hours.  Prescription refills and/or changes in medication(s):  Prescription refills, and/or changes in dose or medication, will be conducted only during scheduled medication management appointments. (Applies to both, written and electronic prescriptions.) No refills on procedure days. No medication will be changed or started on procedure days. No changes, adjustments, and/or refills will be conducted on a procedure day. Doing so will interfere with the diagnostic portion of the procedure. No phone refills. No medications will be "called into the pharmacy". No Fax refills. No weekend refills. No Holliday refills. No after hours refills.  Remember:  Business hours are:  Monday to Thursday 8:00 AM to 4:00 PM Provider's Schedule: Delano Metz, MD - Appointments are:  Medication management: Monday and Wednesday 8:00 AM to 4:00 PM Procedure day: Tuesday and Thursday 7:30 AM to 4:00 PM Edward Jolly, MD - Appointments are:  Medication management: Tuesday and Thursday 8:00 AM to 4:00 PM Procedure day: Monday and Wednesday 7:30 AM to 4:00 PM (Last update: 07/12/2022) ______________________________________________________________________   ____________________________________________________________________________________________  Naloxone Nasal Spray  Why am I receiving this medication? [place] STOP ACT requires that all patients taking high dose opioids or at risk of opioids respiratory depression, be prescribed an opioid reversal agent, such as Naloxone (AKA: Narcan).  What is this medication? NALOXONE (  nal OX one) treats opioid overdose, which causes slow or shallow breathing,  severe drowsiness, or trouble staying awake. Call emergency services after using this medication. You may need additional treatment. Naloxone works by reversing the effects of opioids. It belongs to a group of medications called opioid blockers.  COMMON BRAND NAME(S): Kloxxado, Narcan  What should I tell my care team before I take this medication? They need to know if you have any of these conditions: Heart disease Substance use disorder An unusual or allergic reaction to naloxone, other medications, foods, dyes, or preservatives Pregnant or trying to get pregnant Breast-feeding  When to use this medication? This medication is to be used for the treatment of respiratory depression (less than 8 breaths per minute) secondary to opioid overdose.   How to use this medication? This medication is for use in the nose. Lay the person on their back. Support their neck with your hand and allow the head to tilt back before giving the medication. The nasal spray should be given into 1 nostril. After giving the medication, move the person onto their side. Do not remove or test the nasal spray until ready to use. Get emergency medical help right away after giving the first dose of this medication, even if the person wakes up. You should be familiar with how to recognize the signs and symptoms of a narcotic overdose. If more doses are needed, give the additional dose in the other nostril. Talk to your care team about the use of this medication in children. While this medication may be prescribed for children as young as newborns for selected conditions, precautions do apply.  Naloxone Overdosage: If you think you have taken too much of this medicine contact a poison control center or emergency room at once.  NOTE: This medicine is only for you. Do not share this medicine with others.  What if I miss a dose? This does not apply.  What may interact with this medication? This is only used during an  emergency. No interactions are expected during emergency use. This list may not describe all possible interactions. Give your health care provider a list of all the medicines, herbs, non-prescription drugs, or dietary supplements you use. Also tell them if you smoke, drink alcohol, or use illegal drugs. Some items may interact with your medicine.  What should I watch for while using this medication? Keep this medication ready for use in the case of an opioid overdose. Make sure that you have the phone number of your care team and local hospital ready. You may need to have additional doses of this medication. Each nasal spray contains a single dose. Some emergencies may require additional doses. After use, bring the treated person to the nearest hospital or call 911. Make sure the treating care team knows that the person has received a dose of this medication. You will receive additional instructions on what to do during and after use of this medication before an emergency occurs.  What side effects may I notice from receiving this medication? Side effects that you should report to your care team as soon as possible: Allergic reactions--skin rash, itching, hives, swelling of the face, lips, tongue, or throat Side effects that usually do not require medical attention (report these to your care team if they continue or are bothersome): Constipation Dryness or irritation inside the nose Headache Increase in blood pressure Muscle spasms Stuffy nose Toothache This list may not describe all possible side effects. Call your doctor for  medical advice about side effects. You may report side effects to FDA at 1-800-FDA-1088.  Where should I keep my medication? Because this is an emergency medication, you should keep it with you at all times.  Keep out of the reach of children and pets. Store between 20 and 25 degrees C (68 and 77 degrees F). Do not freeze. Throw away any unused medication after the  expiration date. Keep in original box until ready to use.  NOTE: This sheet is a summary. It may not cover all possible information. If you have questions about this medicine, talk to your doctor, pharmacist, or health care provider.   2023 [employer]/Gold Standard (2021-05-14 00:00:00)  ____________________________________________________________________________________________

## 2023-05-18 NOTE — Progress Notes (Signed)
PROVIDER NOTE: Information contained herein reflects review and annotations entered in association with encounter. Interpretation of such information and data should be left to medically-trained personnel. Information provided to patient can be located elsewhere in the medical record under "Patient Instructions". Document created using STT-dictation technology, any transcriptional errors that may result from process are unintentional.    Patient: Wendy Snyder  Service Category: E/M  Provider: Oswaldo Done, MD  DOB: Jul 04, 1966  DOS: 05/24/2023  Referring Provider: Reubin Milan, MD  MRN: [id number]  Specialty: Interventional Pain Management  PCP: Reubin Milan, MD  Type: Established Patient  Setting: Ambulatory outpatient    Location: Office  Delivery: Face-to-face     HPI  Wendy Snyder, a 57 y.o. year old female, is here today because of her Chronic pain syndrome [G89.4]. Wendy Snyder primary complain today is Back Pain (left)  Pertinent problems: Wendy Snyder has Chronic pain syndrome; Presence of functional implant (Rechargable [employer]onic Neurostimulator) (Cervical Epidural Leads); Spinal cord stimulator in situ (cervical leads); Chronic upper extremity pain (Right); RSD (reflex sympathetic dystrophy) (right upper extremity); Chronic low back pain (1ry area of Pain) (Bilateral) (R>L) w/o sciatica; Chronic hip pain (2ry area of Pain) (Right); Chronic wrist pain (3ry area of Pain) (Right) (CRPS); Chronic shoulder pain (Right); CRPS (complex regional pain syndrome), type I, upper (Right); Chronic pain of left knee; Primary osteoarthritis of left knee; and Rotator cuff tendinitis, left on their pertinent problem list. Pain Assessment: Severity of Chronic pain is reported as a 2 /10. Location: Back Left/pain radiaties down her left side to her knee at times. Onset: More than a month ago. Quality: Aching, Burning, Throbbing, Radiating. Timing: Constant. Modifying factor(s):  Meds, no prolonged sitting, or driving. Vitals:  height is 5\' 7"  (1.702 m) and weight is 150 lb (68 kg). Her temperature is 97.3 F (36.3 C) (abnormal). Her blood pressure is 127/88 and her pulse is 80. Her oxygen saturation is 100%.  BMI: Estimated body mass index is 23.49 kg/m as calculated from the following:   Height as of this encounter: 5\' 7"  (1.702 m).   Weight as of this encounter: 150 lb (68 kg). Last encounter: 05/01/2023. Last procedure: Visit date not found.  Reason for encounter: medication management.  The patient indicates doing well with the current medication regimen. No adverse reactions or side effects reported to the medications.   RTCB: 08/29/2023   Pharmacotherapy Assessment  Analgesic: Hydrocodone/APAP 10/325, 1 tab PO q 4 hrs (60 mg/day of hydrocodone) MME/day: 60 mg/day.   Monitoring: [place] PMP: PDMP reviewed during this encounter.       Pharmacotherapy: No side-effects or adverse reactions reported. Compliance: No problems identified. Effectiveness: Clinically acceptable.  Brigitte Pulse, RN  05/24/2023  8:48 AM  Sign when Signing Visit Nursing Pain Medication Assessment:  Safety precautions to be maintained throughout the outpatient stay will include: orient to surroundings, keep bed in low position, maintain call bell within reach at all times, provide assistance with transfer out of bed and ambulation.  Medication Inspection Compliance: Pill count conducted under aseptic conditions, in front of the patient. Neither the pills nor the bottle was removed from the patient's sight at any time. Once count was completed pills were immediately returned to the patient in their original bottle.  Medication: Hydrocodone/APAP Pill/Patch Count:  42 of 180 pills remain Pill/Patch Appearance: Markings consistent with prescribed medication Bottle Appearance: Standard pharmacy container. Clearly labeled. Filled Date: 8 / 12 / 2024 Last Medication intake:  TodaySafety  precautions to be maintained throughout the outpatient stay will include: orient to surroundings, keep bed in low position, maintain call bell within reach at all times, provide assistance with transfer out of bed and ambulation.     No results found for: "CBDTHCR" No results found for: "D8THCCBX" No results found for: "D9THCCBX"  UDS:  Summary  Date Value Ref Range Status  01/18/2023 Note  Final    Comment:    ==================================================================== [employer] [facility] (MW) ==================================================================== Test                             Result       Flag       Units  Drug Present and Declared for Prescription Verification   Hydrocodone                    901          EXPECTED   ng/mg creat   Hydromorphone                  452          EXPECTED   ng/mg creat   Dihydrocodeine                 218          EXPECTED   ng/mg creat   Norhydrocodone                 2688         EXPECTED   ng/mg creat    Sources of hydrocodone include scheduled prescription medications.    Hydromorphone, dihydrocodeine and norhydrocodone are expected    metabolites of hydrocodone. Hydromorphone and dihydrocodeine are    also available as scheduled prescription medications.  Drug Absent but Declared for Prescription Verification   Alprazolam                     Not Detected UNEXPECTED ng/mg creat ==================================================================== Test                      Result    Flag   Units      Ref Range   Creatinine              119              mg/dL      >=16 ==================================================================== Declared Medications:  The flagging and interpretation on this report are based on the  following declared medications.  Unexpected results may arise from  inaccuracies in the declared medications.   **Note: The testing scope of this panel includes these medications:   Alprazolam (Xanax)   Hydrocodone (Norco)   **Note: The testing scope of this panel does not include the  following reported medications:   Acetaminophen (Norco)  Acyclovir (Zovirax)  Levothyroxine (Synthroid)  Multivitamin  Naloxone (Narcan)  Naproxen (Aleve)  Triamcinolone (Kenalog)  Valacyclovir (Valtrex)  Venlafaxine (Effexor)  Vitamin D3 ==================================================================== For clinical consultation, please call [phone]. ====================================================================       ROS  Constitutional: Denies any fever or chills Gastrointestinal: No reported hemesis, hematochezia, vomiting, or acute GI distress Musculoskeletal: Denies any acute onset joint swelling, redness, loss of ROM, or weakness Neurological: No reported episodes of acute onset apraxia, aphasia, dysarthria, agnosia, amnesia, paralysis, loss of coordination, or loss of consciousness  Medication Review  ALPRAZolam, HYDROcodone-acetaminophen, Vitamin D3, acyclovir ointment, levothyroxine, multivitamin,  naloxone, valACYclovir, and venlafaxine XR  History Review  Allergy: Wendy Snyder is allergic to phenergan [promethazine hcl]. Drug: Wendy Snyder  reports no history of drug use. Alcohol:  reports no history of alcohol use. Tobacco:  reports that she has never smoked. She has never used smokeless tobacco. Social: Wendy Snyder  reports that she has never smoked. She has never used smokeless tobacco. She reports that she does not drink alcohol and does not use drugs. Medical:  has a past medical history of Depression, Hypothyroidism, and Thyroid disease. Surgical: Wendy Snyder  has a past surgical history that includes Spinal cord stimulator implant (2003); Tubal ligation (1990); Fracture surgery (Right); and Breast cyst excision (Left). Family: family history includes Breast cancer in her maternal grandmother; Breast cancer (age of onset: 39) in her mother; Depression in her  mother. She was adopted.  Laboratory Chemistry Profile   Renal Lab Results  Component Value Date   BUN 12 01/13/2023   CREATININE 0.82 01/13/2023   BCR 15 01/13/2023   GFRAA 88 12/10/2019   GFRNONAA 76 12/10/2019    Hepatic Lab Results  Component Value Date   AST 23 01/13/2023   ALT 16 01/13/2023   ALBUMIN 4.8 01/13/2023   ALKPHOS 58 01/13/2023    Electrolytes Lab Results  Component Value Date   NA 144 01/13/2023   K 4.0 01/13/2023   CL 104 01/13/2023   CALCIUM 10.5 (H) 01/13/2023   MG 2.1 12/10/2019    Bone Lab Results  Component Value Date   25OHVITD1 84 12/10/2019   25OHVITD2 <1.0 12/10/2019   25OHVITD3 84 12/10/2019    Inflammation (CRP: Acute Phase) (ESR: Chronic Phase) Lab Results  Component Value Date   CRP <1 12/10/2019   ESRSEDRATE 2 12/10/2019         Note: Above Lab results reviewed.  Recent Imaging Review  MM 3D SCREENING MAMMOGRAM BILATERAL BREAST CLINICAL DATA:  Screening.  EXAM: DIGITAL SCREENING BILATERAL MAMMOGRAM WITH TOMOSYNTHESIS AND CAD  TECHNIQUE: Bilateral screening digital craniocaudal and mediolateral oblique mammograms were obtained. Bilateral screening digital breast tomosynthesis was performed. The images were evaluated with computer-aided detection.  COMPARISON:  Previous exam(s).  ACR Breast Density Category c: The breasts are heterogeneously dense, which may obscure small masses.  FINDINGS: There are no findings suspicious for malignancy.  IMPRESSION: No mammographic evidence of malignancy. A result letter of this screening mammogram will be mailed directly to the patient.  RECOMMENDATION: Screening mammogram in one year. (Code:SM-B-01Y)  BI-RADS CATEGORY  1: Negative.  Electronically Signed   By: Sande Brothers M.D.   On: 02/01/2023 10:22 Note: Reviewed        Physical Exam  General appearance: Well nourished, well developed, and well hydrated. In no apparent acute distress Mental status: Alert, oriented  x 3 (person, place, & time)       Respiratory: No evidence of acute respiratory distress Eyes: PERLA Vitals: BP 127/88   Pulse 80   Temp (!) 97.3 F (36.3 C)   Ht 5\' 7"  (1.702 m)   Wt 150 lb (68 kg)   LMP 10/13/2016 (Within Days)   SpO2 100%   BMI 23.49 kg/m  BMI: Estimated body mass index is 23.49 kg/m as calculated from the following:   Height as of this encounter: 5\' 7"  (1.702 m).   Weight as of this encounter: 150 lb (68 kg). Ideal: Ideal body weight: 61.6 kg (135 lb 12.9 oz) Adjusted ideal body weight: 64.2 kg (141 lb 7.7 oz)  Assessment  Diagnosis Status  1. Chronic pain syndrome   2. Chronic low back pain (1ry area of Pain) (Bilateral) (R>L) w/o sciatica   3. Chronic hip pain (2ry area of Pain) (Right)   4. Chronic wrist pain (3ry area of Pain) (Right) (CRPS)   5. Complex regional pain syndrome type 1 of right upper extremity   6. Pharmacologic therapy   7. Chronic use of opiate for therapeutic purpose   8. Encounter for medication management   9. Encounter for chronic pain management    Controlled Controlled Controlled   Updated Problems: No problems updated.  Plan of Care  Problem-specific:  No problem-specific Assessment & Plan notes found for this encounter.  Wendy Snyder has a current medication list which includes the following long-term medication(s): [START ON 05/31/2023] hydrocodone-acetaminophen, [START ON 06/30/2023] hydrocodone-acetaminophen, [START ON 07/30/2023] hydrocodone-acetaminophen, levothyroxine, naloxone, and venlafaxine xr.  Pharmacotherapy (Medications Ordered): Meds ordered this encounter  Medications   HYDROcodone-acetaminophen (NORCO) 10-325 MG tablet    Sig: Take 1 tablet by mouth every 4 (four) hours as needed for severe pain. Must last 30 days    Dispense:  180 tablet    Refill:  0    DO NOT: delete (not duplicate); no partial-fill (will deny script to complete), no refill request (F/U required). DISPENSE: 1 day early  if closed on fill date. WARN: No CNS-depressants within 8 hrs of med.   HYDROcodone-acetaminophen (NORCO) 10-325 MG tablet    Sig: Take 1 tablet by mouth every 4 (four) hours as needed for severe pain. Must last 30 days    Dispense:  180 tablet    Refill:  0    DO NOT: delete (not duplicate); no partial-fill (will deny script to complete), no refill request (F/U required). DISPENSE: 1 day early if closed on fill date. WARN: No CNS-depressants within 8 hrs of med.   HYDROcodone-acetaminophen (NORCO) 10-325 MG tablet    Sig: Take 1 tablet by mouth every 4 (four) hours as needed for severe pain. Must last 30 days    Dispense:  180 tablet    Refill:  0    DO NOT: delete (not duplicate); no partial-fill (will deny script to complete), no refill request (F/U required). DISPENSE: 1 day early if closed on fill date. WARN: No CNS-depressants within 8 hrs of med.   Orders:  No orders of the defined types were placed in this encounter.  Follow-up plan:   Return in about 3 months (around 08/29/2023) for Eval-day (M,W), (F2F), (MM).      Interventional Therapies  Risk  Complexity Considerations:   Estimated body mass index is 21.93 kg/m as calculated from the following:   Height as of this encounter: 5\' 7"  (1.702 m).   Weight as of this encounter: 140 lb (63.5 kg). WNL   Planned  Pending:      Under consideration:   Diagnostic bilateral lumbar facet block  Diagnostic IA right hip injection    Completed:   None since before 2020.   Therapeutic  Palliative (PRN) options:   Palliative right stellate ganglion block  Management of Cervical spinal cord stimulator         Recent Visits Date Type Provider Dept  05/01/23 Office Visit Delano Metz, MD [facility]  Showing recent visits within past 90 days and meeting all other requirements Today's Visits Date Type Provider Dept  05/24/23 Office Visit Delano Metz, MD [facility]  Showing today's visits  and meeting all other requirements Future  Appointments No visits were found meeting these conditions. Showing future appointments within next 90 days and meeting all other requirements  I discussed the assessment and treatment plan with the patient. The patient was provided an opportunity to ask questions and all were answered. The patient agreed with the plan and demonstrated an understanding of the instructions.  Patient advised to call back or seek an in-person evaluation if the symptoms or condition worsens.  Duration of encounter: 30 minutes.  Total time on encounter, as per AMA guidelines included both the face-to-face and non-face-to-face time personally spent by the physician and/or other qualified health care professional(s) on the day of the encounter (includes time in activities that require the physician or other qualified health care professional and does not include time in activities normally performed by clinical staff). Physician's time may include the following activities when performed: Preparing to see the patient (e.g., pre-charting review of records, searching for previously ordered imaging, lab work, and nerve conduction tests) Review of prior analgesic pharmacotherapies. Reviewing PMP Interpreting ordered tests (e.g., lab work, imaging, nerve conduction tests) Performing post-procedure evaluations, including interpretation of diagnostic procedures Obtaining and/or reviewing separately obtained history Performing a medically appropriate examination and/or evaluation Counseling and educating the patient/family/caregiver Ordering medications, tests, or procedures Referring and communicating with other health care professionals (when not separately reported) Documenting clinical information in the electronic or other health record Independently interpreting results (not separately reported) and communicating results to the patient/ family/caregiver Care coordination (not  separately reported)  Note by: Oswaldo Done, MD Date: 05/24/2023; Time: 9:09 AM

## 2023-05-24 ENCOUNTER — Encounter: Payer: Self-pay | Admitting: Pain Medicine

## 2023-05-24 ENCOUNTER — Ambulatory Visit: Payer: Commercial Managed Care - PPO | Attending: Pain Medicine | Admitting: Pain Medicine

## 2023-05-24 VITALS — BP 127/88 | HR 80 | Temp 97.3°F | Ht 67.0 in | Wt 150.0 lb

## 2023-05-24 DIAGNOSIS — G8929 Other chronic pain: Secondary | ICD-10-CM | POA: Diagnosis present

## 2023-05-24 DIAGNOSIS — M25531 Pain in right wrist: Secondary | ICD-10-CM | POA: Diagnosis present

## 2023-05-24 DIAGNOSIS — Z79891 Long term (current) use of opiate analgesic: Secondary | ICD-10-CM

## 2023-05-24 DIAGNOSIS — Z79899 Other long term (current) drug therapy: Secondary | ICD-10-CM

## 2023-05-24 DIAGNOSIS — M25551 Pain in right hip: Secondary | ICD-10-CM

## 2023-05-24 DIAGNOSIS — G894 Chronic pain syndrome: Secondary | ICD-10-CM | POA: Diagnosis present

## 2023-05-24 DIAGNOSIS — M545 Low back pain, unspecified: Secondary | ICD-10-CM | POA: Diagnosis present

## 2023-05-24 DIAGNOSIS — G90511 Complex regional pain syndrome I of right upper limb: Secondary | ICD-10-CM | POA: Diagnosis present

## 2023-05-24 MED ORDER — HYDROCODONE-ACETAMINOPHEN 10-325 MG PO TABS: 1.0000 | ORAL_TABLET | ORAL | 0 refills | Status: DC | PRN

## 2023-05-24 NOTE — Progress Notes (Signed)
Nursing Pain Medication Assessment:  Safety precautions to be maintained throughout the outpatient stay will include: orient to surroundings, keep bed in low position, maintain call bell within reach at all times, provide assistance with transfer out of bed and ambulation.  Medication Inspection Compliance: Pill count conducted under aseptic conditions, in front of the patient. Neither the pills nor the bottle was removed from the patient's sight at any time. Once count was completed pills were immediately returned to the patient in their original bottle.  Medication: Hydrocodone/APAP Pill/Patch Count:  42 of 180 pills remain Pill/Patch Appearance: Markings consistent with prescribed medication Bottle Appearance: Standard pharmacy container. Clearly labeled. Filled Date: 8 / 12 / 2024 Last Medication intake:  TodaySafety precautions to be maintained throughout the outpatient stay will include: orient to surroundings, keep bed in low position, maintain call bell within reach at all times, provide assistance with transfer out of bed and ambulation.

## 2023-07-04 ENCOUNTER — Telehealth: Payer: Commercial Managed Care - PPO | Admitting: Physician Assistant

## 2023-07-04 DIAGNOSIS — J029 Acute pharyngitis, unspecified: Secondary | ICD-10-CM | POA: Diagnosis not present

## 2023-07-04 NOTE — Progress Notes (Signed)
?  E-Visit for Sore Throat ? ?We are sorry that you are not feeling well.  Here is how we plan to help! ? ?Your symptoms indicate a likely viral infection (Pharyngitis).   Pharyngitis is inflammation in the back of the throat which can cause a sore throat, scratchiness and sometimes difficulty swallowing.   Pharyngitis is typically caused by a respiratory virus and will just run its course.  Please keep in mind that your symptoms could last up to 10 days.  For throat pain, we recommend over the counter oral pain relief medications such as acetaminophen or aspirin, or anti-inflammatory medications such as ibuprofen or naproxen sodium.  Topical treatments such as oral throat lozenges or sprays may be used as needed.  Avoid close contact with loved ones, especially the very young and elderly.  Remember to wash your hands thoroughly throughout the day as this is the number one way to prevent the spread of infection and wipe down door knobs and counters with disinfectant. ? ?After careful review of your answers, I would not recommend an antibiotic for your condition.  Antibiotics should not be used to treat conditions that we suspect are caused by viruses like the virus that causes the common cold or flu. However, some people can have Strep with atypical symptoms. You may need formal testing in clinic or office to confirm if your symptoms continue or worsen. ? ?Providers prescribe antibiotics to treat infections caused by bacteria. Antibiotics are very powerful in treating bacterial infections when they are used properly.  To maintain their effectiveness, they should be used only when necessary.  Overuse of antibiotics has resulted in the development of super bugs that are resistant to treatment!   ? ?Home Care: ?Only take medications as instructed by your medical team. ?Do not drink alcohol while taking these medications. ?A steam or ultrasonic humidifier can help congestion.  You can place a towel over your head and  breathe in the steam from hot water coming from a faucet. ?Avoid close contacts especially the very young and the elderly. ?Cover your mouth when you cough or sneeze. ?Always remember to wash your hands. ? ?Get Help Right Away If: ?You develop worsening fever or throat pain. ?You develop a severe head ache or visual changes. ?Your symptoms persist after you have completed your treatment plan. ? ?Make sure you ?Understand these instructions. ?Will watch your condition. ?Will get help right away if you are not doing well or get worse. ? ? ?Thank you for choosing an e-visit. ? ?Your e-visit answers were reviewed by a board certified advanced clinical practitioner to complete your personal care plan. Depending upon the condition, your plan could have included both over the counter or prescription medications. ? ?Please review your pharmacy choice. Make sure the pharmacy is open so you can pick up prescription now. If there is a problem, you may contact your provider through [employer] and have the prescription routed to another pharmacy.  Your safety is important to [place]. If you have drug allergies check your prescription carefully.  ? ?For the next 24 hours you can use MyChart to ask questions about today's visit, request a non-urgent call back, or ask for a work or school excuse. ?You will get an email in the next two days asking about your experience. I hope that your e-visit has been valuable and will speed your recovery. ? ?

## 2023-07-04 NOTE — Progress Notes (Signed)
I have spent 5 minutes in review of e-visit questionnaire, review and updating patient chart, medical decision making and response to patient.   Mia Milan Cody Jacklynn Dehaas, PA-C    

## 2023-08-01 ENCOUNTER — Other Ambulatory Visit: Payer: Self-pay | Admitting: Internal Medicine

## 2023-08-01 DIAGNOSIS — F064 Anxiety disorder due to known physiological condition: Secondary | ICD-10-CM

## 2023-08-02 NOTE — Telephone Encounter (Signed)
Requested medication (s) are due for refill today: Yes  Requested medication (s) are on the active medication list: Yes  Last refill:  01/13/23  Future visit scheduled: No  Notes to clinic:  Not delegated.    Requested Prescriptions  Pending Prescriptions Disp Refills   ALPRAZolam (XANAX) 0.5 MG tablet [Pharmacy Med Name: ALPRAZOLAM 0.5 MG TABLET] 60 tablet     Sig: TAKE 1 TABLET BY MOUTH TWICE A DAY AS NEEDED FOR ANXIETY     Not Delegated - Psychiatry: Anxiolytics/Hypnotics 2 Failed - 08/01/2023  7:38 PM      Failed - This refill cannot be delegated      Failed - Urine Drug Screen completed in last 360 days      Failed - Valid encounter within last 6 months    Recent Outpatient Visits           6 months ago Annual physical exam   [facility], Nyoka Cowden, MD   7 months ago Primary osteoarthritis of left knee   [facility], Ocie Bob, MD   8 months ago Primary osteoarthritis of left knee   [facility], Ocie Bob, MD   8 months ago Chronic pain of left knee   [facility] at [facility], Nyoka Cowden, MD   1 year ago Annual physical exam   [facility] at [facility], Nyoka Cowden, MD              Passed - Patient is not pregnant

## 2023-08-02 NOTE — Telephone Encounter (Signed)
Please review.  KP

## 2023-08-23 ENCOUNTER — Encounter: Payer: Self-pay | Admitting: Pain Medicine

## 2023-08-23 ENCOUNTER — Ambulatory Visit: Payer: Commercial Managed Care - PPO | Attending: Pain Medicine | Admitting: Pain Medicine

## 2023-08-23 VITALS — BP 138/90 | HR 65 | Temp 97.9°F | Resp 16 | Ht 67.0 in | Wt 140.0 lb

## 2023-08-23 DIAGNOSIS — G894 Chronic pain syndrome: Secondary | ICD-10-CM | POA: Insufficient documentation

## 2023-08-23 DIAGNOSIS — M25551 Pain in right hip: Secondary | ICD-10-CM | POA: Diagnosis present

## 2023-08-23 DIAGNOSIS — G8929 Other chronic pain: Secondary | ICD-10-CM | POA: Diagnosis present

## 2023-08-23 DIAGNOSIS — G90511 Complex regional pain syndrome I of right upper limb: Secondary | ICD-10-CM | POA: Diagnosis present

## 2023-08-23 DIAGNOSIS — Z79899 Other long term (current) drug therapy: Secondary | ICD-10-CM | POA: Diagnosis present

## 2023-08-23 DIAGNOSIS — M25531 Pain in right wrist: Secondary | ICD-10-CM | POA: Diagnosis present

## 2023-08-23 DIAGNOSIS — Z79891 Long term (current) use of opiate analgesic: Secondary | ICD-10-CM | POA: Diagnosis present

## 2023-08-23 DIAGNOSIS — M545 Low back pain, unspecified: Secondary | ICD-10-CM | POA: Insufficient documentation

## 2023-08-23 MED ORDER — HYDROCODONE-ACETAMINOPHEN 10-325 MG PO TABS: 1.0000 | ORAL_TABLET | ORAL | 0 refills | Status: DC | PRN

## 2023-08-23 NOTE — Progress Notes (Signed)
60Nursing Pain Medication Assessment:  Safety precautions to be maintained throughout the outpatient stay will include: orient to surroundings, keep bed in low position, maintain call bell within reach at all times, provide assistance with transfer out of bed and ambulation.   Medication Inspection Compliance: Pill count conducted under aseptic conditions, in front of the patient. Neither the pills nor the bottle was removed from the patient's sight at any time. Once count was completed pills were immediately returned to the patient in their original bottle.  Medication: Hydrocodone/APAP Pill/Patch Count:  60 of 180 pills remain Pill/Patch Appearance: Markings consistent with prescribed medication Bottle Appearance: Standard pharmacy container. Clearly labeled. Filled Date: 21 / 12 / 2024 Last Medication intake:  Today

## 2023-08-23 NOTE — Progress Notes (Signed)
PROVIDER NOTE: Information contained herein reflects review and annotations entered in association with encounter. Interpretation of such information and data should be left to medically-trained personnel. Information provided to patient can be located elsewhere in the medical record under "Patient Instructions". Document created using STT-dictation technology, any transcriptional errors that may result from process are unintentional.    Patient: Wendy Snyder  Service Category: E/M  Provider: Oswaldo Done, MD  DOB: [birth year]  DOS: 08/23/2023  Referring Provider: Reubin Milan, MD  MRN: [id number]  Specialty: Interventional Pain Management  PCP: Reubin Milan, MD  Type: Established Patient  Setting: Ambulatory outpatient    Location: Office  Delivery: Face-to-face     HPI  [name], a 57 y.o. year old female, is here today because of her Chronic bilateral low back pain without sciatica [M54.50, G89.29]. [name] primary complain today is Back Pain and Hip Pain  Pertinent problems: [name] has Chronic pain syndrome; Presence of functional implant (Rechargable Medtronic Neurostimulator) (Cervical Epidural Leads); Spinal cord stimulator in situ (cervical leads); Chronic upper extremity pain (Right); RSD (reflex sympathetic dystrophy) (right upper extremity); Chronic low back pain (1ry area of Pain) (Bilateral) (R>L) w/o sciatica; Chronic hip pain (2ry area of Pain) (Right); Chronic wrist pain (3ry area of Pain) (Right) (CRPS); Chronic shoulder pain (Right); CRPS (complex regional pain syndrome), type I, upper (Right); Chronic pain of left knee; Primary osteoarthritis of left knee; and Rotator cuff tendinitis, left on their pertinent problem list. Pain Assessment: Severity of Chronic pain is reported as a 1 /10. Location: Back Lower, Right, Left/Radaites into right hip. Onset: More than a month ago. Quality: Constant. Timing: Constant. Modifying factor(s): Pain  medication and movement. Vitals:  height is 5\' 7"  (1.702 m) and weight is 140 lb (63.5 kg). Her temporal temperature is 97.9 F (36.6 C). Her blood pressure is 138/90 (abnormal) and her pulse is 65. Her respiration is 16 and oxygen saturation is 100%.  BMI: Estimated body mass index is 21.93 kg/m as calculated from the following:   Height as of this encounter: 5\' 7"  (1.702 m).   Weight as of this encounter: 140 lb (63.5 kg). Last encounter: 05/24/2023. Last procedure: Visit date not found.  Reason for encounter: medication management.  The patient indicates doing well with the current medication regimen. No adverse reactions or side effects reported to the medications.  Discussed the use of AI scribe software for clinical note transcription with the patient, who gave verbal consent to proceed.  History of Present Illness   The patient, with a history of chronic pain managed by a spinal cord stimulator, reports that the device's battery depleted approximately two years ago. They have not pursued replacement due to personal circumstances, specifically their spouse's health issues. The patient's medications have been well-tolerated without any reported side effects or adverse reactions. The patient's urine and PMP tests from the previous visit were unremarkable. The patient plans to revisit the issue of the spinal cord stimulator in approximately six months, depending on the stability of their home situation.     RTCB: 11/27/2023   Pharmacotherapy Assessment  Analgesic: Hydrocodone/APAP 10/325, 1 tab PO q 4 hrs (60 mg/day of hydrocodone) MME/day: 60 mg/day.   Monitoring: [place] PMP: PDMP reviewed during this encounter.       Pharmacotherapy: No side-effects or adverse reactions reported. Compliance: No problems identified. Effectiveness: Clinically acceptable.  Earlyne Iba, RN  08/23/2023  8:38 AM  Sign when Signing Visit 60Nursing Pain Medication  Assessment:  Safety precautions to be maintained  throughout the outpatient stay will include: orient to surroundings, keep bed in low position, maintain call bell within reach at all times, provide assistance with transfer out of bed and ambulation.   Medication Inspection Compliance: Pill count conducted under aseptic conditions, in front of the patient. Neither the pills nor the bottle was removed from the patient's sight at any time. Once count was completed pills were immediately returned to the patient in their original bottle.  Medication: Hydrocodone/APAP Pill/Patch Count:  60 of 180 pills remain Pill/Patch Appearance: Markings consistent with prescribed medication Bottle Appearance: Standard pharmacy container. Clearly labeled. Filled Date: 70 / 12 / 2024 Last Medication intake:  Today    No results found for: "CBDTHCR" No results found for: "D8THCCBX" No results found for: "D9THCCBX"  UDS:  Summary  Date Value Ref Range Status  01/18/2023 Note  Final    Comment:    ==================================================================== [employer] [facility] 13 (MW) ==================================================================== Test                             Result       Flag       Units  Drug Present and Declared for Prescription Verification   Hydrocodone                    901          EXPECTED   ng/mg creat   Hydromorphone                  452          EXPECTED   ng/mg creat   Dihydrocodeine                 218          EXPECTED   ng/mg creat   Norhydrocodone                 2688         EXPECTED   ng/mg creat    Sources of hydrocodone include scheduled prescription medications.    Hydromorphone, dihydrocodeine and norhydrocodone are expected    metabolites of hydrocodone. Hydromorphone and dihydrocodeine are    also available as scheduled prescription medications.  Drug Absent but Declared for Prescription Verification   Alprazolam                     Not Detected UNEXPECTED ng/mg  creat ==================================================================== Test                      Result    Flag   Units      Ref Range   Creatinine              119              mg/dL      >=16 ==================================================================== Declared Medications:  The flagging and interpretation on this report are based on the  following declared medications.  Unexpected results may arise from  inaccuracies in the declared medications.   **Note: The testing scope of this panel includes these medications:   Alprazolam (Xanax)  Hydrocodone (Norco)   **Note: The testing scope of this panel does not include the  following reported medications:   Acetaminophen (Norco)  Acyclovir (Zovirax)  Levothyroxine (Synthroid)  Multivitamin  Naloxone (Narcan)  Naproxen (Aleve)  Triamcinolone (Kenalog)  Valacyclovir (Valtrex)  Venlafaxine (Effexor)  Vitamin D3 ==================================================================== For clinical consultation, please call [phone]. ====================================================================       ROS  Constitutional: Denies any fever or chills Gastrointestinal: No reported hemesis, hematochezia, vomiting, or acute GI distress Musculoskeletal: Denies any acute onset joint swelling, redness, loss of ROM, or weakness Neurological: No reported episodes of acute onset apraxia, aphasia, dysarthria, agnosia, amnesia, paralysis, loss of coordination, or loss of consciousness  Medication Review  ALPRAZolam, HYDROcodone-acetaminophen, Vitamin D3, acyclovir ointment, levothyroxine, multivitamin, naloxone, valACYclovir, and venlafaxine XR  History Review  Allergy: [name] is allergic to phenergan [promethazine hcl]. Drug: [name]  reports no history of drug use. Alcohol:  reports no history of alcohol use. Tobacco:  reports that she has never smoked. She has never used smokeless tobacco. Social: Ms.  Wendy Snyder  reports that she has never smoked. She has never used smokeless tobacco. She reports that she does not drink alcohol and does not use drugs. Medical:  has a past medical history of Depression, Hypothyroidism, and Thyroid disease. Surgical: [name]  has a past surgical history that includes Spinal cord stimulator implant (2003); Tubal ligation (1990); Fracture surgery (Right); and Breast cyst excision (Left). Family: family history includes Breast cancer in her maternal grandmother; Breast cancer (age of onset: 20) in her mother; Depression in her mother. She was adopted.  Laboratory Chemistry Profile   Renal Lab Results  Component Value Date   BUN 12 01/13/2023   CREATININE 0.82 01/13/2023   BCR 15 01/13/2023   GFRAA 88 12/10/2019   GFRNONAA 76 12/10/2019    Hepatic Lab Results  Component Value Date   AST 23 01/13/2023   ALT 16 01/13/2023   ALBUMIN 4.8 01/13/2023   ALKPHOS 58 01/13/2023    Electrolytes Lab Results  Component Value Date   NA 144 01/13/2023   K 4.0 01/13/2023   CL 104 01/13/2023   CALCIUM 10.5 (H) 01/13/2023   MG 2.1 12/10/2019    Bone Lab Results  Component Value Date   25OHVITD1 84 12/10/2019   25OHVITD2 <1.0 12/10/2019   25OHVITD3 84 12/10/2019    Inflammation (CRP: Acute Phase) (ESR: Chronic Phase) Lab Results  Component Value Date   CRP <1 12/10/2019   ESRSEDRATE 2 12/10/2019         Note: Above Lab results reviewed.  Recent Imaging Review  MM 3D SCREENING MAMMOGRAM BILATERAL BREAST CLINICAL DATA:  Screening.  EXAM: DIGITAL SCREENING BILATERAL MAMMOGRAM WITH TOMOSYNTHESIS AND CAD  TECHNIQUE: Bilateral screening digital craniocaudal and mediolateral oblique mammograms were obtained. Bilateral screening digital breast tomosynthesis was performed. The images were evaluated with computer-aided detection.  COMPARISON:  Previous exam(s).  ACR Breast Density Category c: The breasts are heterogeneously dense, which may obscure  small masses.  FINDINGS: There are no findings suspicious for malignancy.  IMPRESSION: No mammographic evidence of malignancy. A result letter of this screening mammogram will be mailed directly to the patient.  RECOMMENDATION: Screening mammogram in one year. (Code:SM-B-01Y)  BI-RADS CATEGORY  1: Negative.  Electronically Signed   By: Sande Brothers M.D.   On: 02/01/2023 10:22 Note: Reviewed        Physical Exam  General appearance: Well nourished, well developed, and well hydrated. In no apparent acute distress Mental status: Alert, oriented x 3 (person, place, & time)       Respiratory: No evidence of acute respiratory distress Eyes: PERLA Vitals: BP (!) 138/90   Pulse 65   Temp 97.9 F (36.6 C) (Temporal)   Resp 16  Ht 5\' 7"  (1.702 m)   Wt 140 lb (63.5 kg)   LMP 10/13/2016 (Within Days)   SpO2 100%   BMI 21.93 kg/m  BMI: Estimated body mass index is 21.93 kg/m as calculated from the following:   Height as of this encounter: 5\' 7"  (1.702 m).   Weight as of this encounter: 140 lb (63.5 kg). Ideal: Ideal body weight: 61.6 kg (135 lb 12.9 oz) Adjusted ideal body weight: 62.4 kg (137 lb 7.7 oz)  Assessment   Diagnosis Status  1. Chronic low back pain (1ry area of Pain) (Bilateral) (R>L) w/o sciatica   2. Chronic hip pain (2ry area of Pain) (Right)   3. Chronic wrist pain (3ry area of Pain) (Right) (CRPS)   4. Complex regional pain syndrome type 1 of right upper extremity   5. Chronic pain syndrome   6. Pharmacologic therapy   7. Chronic use of opiate for therapeutic purpose   8. Encounter for medication management   9. Encounter for chronic pain management    Controlled Controlled Controlled   Updated Problems: No problems updated.  Plan of Care  Problem-specific:  Assessment and Plan    Spinal Cord Stimulator Battery Depletion The battery of their spinal cord stimulator has been depleted for about two years. They prefer to postpone the replacement  for approximately six months due to their husband's upcoming surgery. We will discuss the replacement of the spinal cord stimulator battery in six months or once the home situation stabilizes.  Chronic Pain Management They are on a well-managed medication regimen for chronic pain with no reported side effects, and both PMP and urine tests from the last visit were normal. We informed them that a nurse practitioner would handle some medication refills, but they should schedule specific evaluations or interventional therapies directly with the doctor. We will send medication refills to the pharmacy, schedule a follow-up appointment in 90 days, and ensure that future appointments for specific evaluations or interventional therapies are scheduled with the doctor.       [name] has a current medication list which includes the following long-term medication(s): [START ON 08/29/2023] hydrocodone-acetaminophen, [START ON 09/28/2023] hydrocodone-acetaminophen, [START ON 10/28/2023] hydrocodone-acetaminophen, levothyroxine, naloxone, and venlafaxine xr.  Pharmacotherapy (Medications Ordered): Meds ordered this encounter  Medications   HYDROcodone-acetaminophen (NORCO) 10-325 MG tablet    Sig: Take 1 tablet by mouth every 4 (four) hours as needed for severe pain (pain score 7-10). Must last 30 days    Dispense:  180 tablet    Refill:  0    DO NOT: delete (not duplicate); no partial-fill (will deny script to complete), no refill request (F/U required). DISPENSE: 1 day early if closed on fill date. WARN: No CNS-depressants within 8 hrs of med.   HYDROcodone-acetaminophen (NORCO) 10-325 MG tablet    Sig: Take 1 tablet by mouth every 4 (four) hours as needed for severe pain (pain score 7-10). Must last 30 days    Dispense:  180 tablet    Refill:  0    DO NOT: delete (not duplicate); no partial-fill (will deny script to complete), no refill request (F/U required). DISPENSE: 1 day early if closed on  fill date. WARN: No CNS-depressants within 8 hrs of med.   HYDROcodone-acetaminophen (NORCO) 10-325 MG tablet    Sig: Take 1 tablet by mouth every 4 (four) hours as needed for severe pain (pain score 7-10). Must last 30 days    Dispense:  180 tablet    Refill:  0    DO NOT: delete (not duplicate); no partial-fill (will deny script to complete), no refill request (F/U required). DISPENSE: 1 day early if closed on fill date. WARN: No CNS-depressants within 8 hrs of med.   Orders:  No orders of the defined types were placed in this encounter.  Follow-up plan:   No follow-ups on file.      Interventional Therapies  Risk  Complexity Considerations:   Estimated body mass index is 21.93 kg/m as calculated from the following:   Height as of this encounter: 5\' 7"  (1.702 m).   Weight as of this encounter: 140 lb (63.5 kg). WNL   Planned  Pending:      Under consideration:   Diagnostic bilateral lumbar facet block  Diagnostic IA right hip injection    Completed:   None since before 2020.   Therapeutic  Palliative (PRN) options:   Palliative right stellate ganglion block  Management of Cervical spinal cord stimulator       Recent Visits No visits were found meeting these conditions. Showing recent visits within past 90 days and meeting all other requirements Today's Visits Date Type Provider Dept  08/23/23 Office Visit Delano Metz, MD [facility]  Showing today's visits and meeting all other requirements Future Appointments No visits were found meeting these conditions. Showing future appointments within next 90 days and meeting all other requirements  I discussed the assessment and treatment plan with the patient. The patient was provided an opportunity to ask questions and all were answered. The patient agreed with the plan and demonstrated an understanding of the instructions.  Patient advised to call back or seek an in-person evaluation if the symptoms  or condition worsens.  Duration of encounter: 30 minutes.  Total time on encounter, as per AMA guidelines included both the face-to-face and non-face-to-face time personally spent by the physician and/or other qualified health care professional(s) on the day of the encounter (includes time in activities that require the physician or other qualified health care professional and does not include time in activities normally performed by clinical staff). Physician's time may include the following activities when performed: Preparing to see the patient (e.g., pre-charting review of records, searching for previously ordered imaging, lab work, and nerve conduction tests) Review of prior analgesic pharmacotherapies. Reviewing PMP Interpreting ordered tests (e.g., lab work, imaging, nerve conduction tests) Performing post-procedure evaluations, including interpretation of diagnostic procedures Obtaining and/or reviewing separately obtained history Performing a medically appropriate examination and/or evaluation Counseling and educating the patient/family/caregiver Ordering medications, tests, or procedures Referring and communicating with other health care professionals (when not separately reported) Documenting clinical information in the electronic or other health record Independently interpreting results (not separately reported) and communicating results to the patient/ family/caregiver Care coordination (not separately reported)  Note by: Oswaldo Done, MD Date: 08/23/2023; Time: 12:08 PM

## 2023-08-23 NOTE — Patient Instructions (Signed)

## 2023-09-21 ENCOUNTER — Other Ambulatory Visit: Payer: Self-pay | Admitting: Internal Medicine

## 2023-09-21 DIAGNOSIS — F064 Anxiety disorder due to known physiological condition: Secondary | ICD-10-CM

## 2023-09-25 NOTE — Telephone Encounter (Signed)
 Requested medication (s) are due for refill today -yes  Requested medication (s) are on the active medication list -yes  Future visit scheduled -yes  Last refill: 08/02/23 #60  Notes to clinic: non delegated Rx  Requested Prescriptions  Pending Prescriptions Disp Refills   ALPRAZolam  (XANAX ) 0.5 MG tablet [Pharmacy Med Name: ALPRAZOLAM  0.5 MG TABLET] 60 tablet     Sig: TAKE 1 TABLET BY MOUTH TWICE A DAY AS NEEDED FOR ANXIETY     Not Delegated - Psychiatry: Anxiolytics/Hypnotics 2 Failed - 09/25/2023  9:39 AM      Failed - This refill cannot be delegated      Failed - Urine Drug Screen completed in last 360 days      Failed - Valid encounter within last 6 months    Recent Outpatient Visits           8 months ago Annual physical exam   [facility], Leita DEL, MD   9 months ago Primary osteoarthritis of left knee   [facility], Selinda PARAS, MD   10 months ago Primary osteoarthritis of left knee   [facility], Selinda PARAS, MD   10 months ago Chronic pain of left knee   [facility] at [facility], Leita DEL, MD   1 year ago Annual physical exam   [facility] at [facility], Leita DEL, MD       Future Appointments             In 3 months Justus Leita DEL, MD [facility] at [facility], [facility]            Passed - Patient is not pregnant         Requested Prescriptions  Pending Prescriptions Disp Refills   ALPRAZolam  (XANAX ) 0.5 MG tablet [Pharmacy Med Name: ALPRAZOLAM  0.5 MG TABLET] 60 tablet     Sig: TAKE 1 TABLET BY MOUTH TWICE A DAY AS NEEDED FOR ANXIETY     Not Delegated - Psychiatry: Anxiolytics/Hypnotics 2 Failed - 09/25/2023  9:39 AM      Failed - This refill cannot be  delegated      Failed - Urine Drug Screen completed in last 360 days      Failed - Valid encounter within last 6 months    Recent Outpatient Visits           8 months ago Annual physical exam   [facility], Leita DEL, MD   9 months ago Primary osteoarthritis of left knee   [facility] at [facility], Selinda PARAS, MD   10 months ago Primary osteoarthritis of left knee   [facility], Selinda PARAS, MD   10 months ago Chronic pain of left knee   [facility] at [facility], Leita DEL, MD   1 year ago Annual physical exam   [facility] at [facility], Leita DEL, MD       Future Appointments             In 3 months Justus Leita DEL,  MD [facility] at [facility], [facility]            Passed - Patient is not pregnant

## 2023-10-23 ENCOUNTER — Encounter: Payer: Self-pay | Admitting: Internal Medicine

## 2023-10-26 ENCOUNTER — Telehealth: Payer: Commercial Managed Care - PPO | Admitting: Family Medicine

## 2023-10-26 ENCOUNTER — Telehealth: Payer: Self-pay | Admitting: Physician Assistant

## 2023-10-26 DIAGNOSIS — L237 Allergic contact dermatitis due to plants, except food: Secondary | ICD-10-CM

## 2023-10-26 MED ORDER — PREDNISONE 10 MG PO TABS: ORAL_TABLET | ORAL | 0 refills | Status: AC

## 2023-10-26 NOTE — Progress Notes (Signed)
 [place]   Doing a more specific EV for proper treatment needs.

## 2023-10-26 NOTE — Progress Notes (Signed)
 E Visit for Rash  We are sorry that you are not feeling well. Here is how we plan to help!  Based on what you shared with me it looks like you have contact dermatitis.  Contact dermatitis is a skin rash caused by something that touches the skin and causes irritation or inflammation.  Your skin may be red, swollen, dry, cracked, and itch.  The rash should go away in a few days but can last a few weeks.  If you get a rash, it's important to figure out what caused it so the irritant can be avoided in the future. and I am prescribing a two week course of steroids (37 tablets of 10 mg prednisone).  Days 1-4 take 4 tablets (40 mg) daily  Days 5-8 take 3 tablets (30 mg) daily, Days 9-11 take 2 tablets (20 mg) daily, Days 12-14 take 1 tablet (10 mg) daily.       HOME CARE:  Take cool showers and avoid direct sunlight. Apply cool compress or wet dressings. Take a bath in an oatmeal bath.  Sprinkle content of one Aveeno packet under running faucet with comfortably warm water.  Bathe for 15-20 minutes, 1-2 times daily.  Pat dry with a towel. Do not rub the rash. Use hydrocortisone cream. Take an antihistamine like Benadryl for widespread rashes that itch.  The adult dose of Benadryl is 25-50 mg by mouth 4 times daily. Caution:  This type of medication may cause sleepiness.  Do not drink alcohol, drive, or operate dangerous machinery while taking antihistamines.  Do not take these medications if you have prostate enlargement.  Read package instructions thoroughly on all medications that you take.  GET HELP RIGHT AWAY IF:  Symptoms don't go away after treatment. Severe itching that persists. If you rash spreads or swells. If you rash begins to smell. If it blisters and opens or develops a yellow-brown crust. You develop a fever. You have a sore throat. You become short of breath.  MAKE SURE YOU:  Understand these instructions. Will watch your condition. Will get help right away if you are not  doing well or get worse.  Thank you for choosing an e-visit.  Your e-visit answers were reviewed by a board certified advanced clinical practitioner to complete your personal care plan. Depending upon the condition, your plan could have included both over the counter or prescription medications.  Please review your pharmacy choice. Make sure the pharmacy is open so you can pick up prescription now. If there is a problem, you may contact your provider through [employer] and have the prescription routed to another pharmacy.  Your safety is important to [place]. If you have drug allergies check your prescription carefully.   For the next 24 hours you can use MyChart to ask questions about today's visit, request a non-urgent call back, or ask for a work or school excuse. You will get an email in the next two days asking about your experience. I hope that your e-visit has been valuable and will speed your recovery.

## 2023-10-26 NOTE — Progress Notes (Signed)
 I have spent 5 minutes in review of e-visit questionnaire, review and updating patient chart, medical decision making and response to patient.   Piedad Climes, PA-C

## 2023-11-18 ENCOUNTER — Other Ambulatory Visit: Payer: Self-pay | Admitting: Internal Medicine

## 2023-11-18 DIAGNOSIS — F064 Anxiety disorder due to known physiological condition: Secondary | ICD-10-CM

## 2023-11-20 NOTE — Telephone Encounter (Signed)
 Requested Prescriptions  Pending Prescriptions Disp Refills   venlafaxine XR (EFFEXOR-XR) 75 MG 24 hr capsule [Pharmacy Med Name: VENLAFAXINE HCL ER 75 MG CAP] 90 capsule 0    Sig: TAKE 1 CAPSULE BY MOUTH EVERY DAY     Psychiatry: Antidepressants - SNRI - desvenlafaxine & venlafaxine Failed - 11/20/2023  4:00 PM      Failed - Last BP in normal range    BP Readings from Last 1 Encounters:  08/23/23 (!) 138/90         Failed - Valid encounter within last 6 months    Recent Outpatient Visits           10 months ago Annual physical exam   [facility] Nyoka Cowden, MD   10 months ago Primary osteoarthritis of left knee   [facility] at [facility], Ocie Bob, MD   11 months ago Primary osteoarthritis of left knee   [facility] at [facility], Ocie Bob, MD   1 year ago Chronic pain of left knee   [facility] at [facility], Nyoka Cowden, MD   1 year ago Annual physical exam   [facility] at [facility], Nyoka Cowden, MD       Future Appointments             In 1 month Reubin Milan, MD [facility] at [facility], [facility]            Failed - Lipid Panel in normal range within the last 12 months    Cholesterol, Total  Date Value Ref Range Status  01/13/2023 196 100 - 199 mg/dL Final   LDL Chol Calc (NIH)  Date Value Ref Range Status  01/13/2023 76 0 - 99 mg/dL Final   HDL  Date Value Ref Range Status  01/13/2023 107 >39 mg/dL Final   Triglycerides  Date Value Ref Range Status  01/13/2023 69 0 - 149 mg/dL Final         Passed - Cr in normal range and within 360 days    Creatinine  Date Value Ref Range Status  11/08/2013 0.93 0.60 - 1.30 mg/dL Final   Creatinine, Ser  Date Value Ref Range Status  01/13/2023  0.82 0.57 - 1.00 mg/dL Final         Passed - Completed PHQ-2 or PHQ-9 in the last 360 days

## 2023-11-21 NOTE — Patient Instructions (Signed)

## 2023-11-21 NOTE — Progress Notes (Unsigned)
 PROVIDER NOTE: Information contained herein reflects review and annotations entered in association with encounter. Interpretation of such information and data should be left to medically-trained personnel. Information provided to patient can be located elsewhere in the medical record under "Patient Instructions". Document created using STT-dictation technology, any transcriptional errors that may result from process are unintentional.    Patient: Wendy Snyder  Service Category: E/M  Provider: Oswaldo Done, MD  DOB: [birth year]  DOS: 11/22/2023  Referring Provider: Reubin Milan, MD  MRN: [id number]  Specialty: Interventional Pain Management  PCP: Reubin Milan, MD  Type: Established Patient  Setting: Ambulatory outpatient    Location: Office  Delivery: Face-to-face     HPI  Wendy Snyder, a 58 y.o. year old female, is here today because of her Chronic pain syndrome [G89.4]. Wendy Snyder primary complain today is No chief complaint on file.  Pertinent problems: Wendy Snyder has Chronic pain syndrome; Presence of functional implant (Rechargable Medtronic Neurostimulator) (Cervical Epidural Leads); Spinal cord stimulator in situ (cervical leads); Chronic upper extremity pain (Right); RSD (reflex sympathetic dystrophy) (right upper extremity); Chronic low back pain (1ry area of Pain) (Bilateral) (R>L) w/o sciatica; Chronic hip pain (2ry area of Pain) (Right); Chronic wrist pain (3ry area of Pain) (Right) (CRPS); Chronic shoulder pain (Right); CRPS (complex regional pain syndrome), type I, upper (Right); Chronic pain of left knee; Primary osteoarthritis of left knee; and Rotator cuff tendinitis, left on their pertinent problem list. Pain Assessment: Severity of   is reported as a  /10. Location:    / . Onset:  . Quality:  . Timing:  . Modifying factor(s):  Marland Kitchen Vitals:  vitals were not taken for this visit.  BMI: Estimated body mass index is 21.93 kg/m as calculated from the  following:   Height as of 08/23/23: 5\' 7"  (1.702 m).   Weight as of 08/23/23: 140 lb (63.5 kg). Last encounter: 08/23/2023. Last procedure: Visit date not found.  Reason for encounter: medication management. ***  Discussed the use of AI scribe software for clinical note transcription with the patient, who gave verbal consent to proceed.  History of Present Illness         02/25/2024   Pharmacotherapy Assessment  Analgesic: Hydrocodone/APAP 10/325, 1 tab PO q 4 hrs (60 mg/day of hydrocodone) MME/day: 60 mg/day.   Monitoring: [place] PMP: PDMP reviewed during this encounter.       Pharmacotherapy: No side-effects or adverse reactions reported. Compliance: No problems identified. Effectiveness: Clinically acceptable.  No notes on file  No results found for: "CBDTHCR" No results found for: "D8THCCBX" No results found for: "D9THCCBX"  UDS:  Summary  Date Value Ref Range Status  01/18/2023 Note  Final    Comment:    ==================================================================== [employer] Select 13 (MW) ==================================================================== Test                             Result       Flag       Units  Drug Present and Declared for Prescription Verification   Hydrocodone                    901          EXPECTED   ng/mg creat   Hydromorphone                  452          EXPECTED  ng/mg creat   Dihydrocodeine                 218          EXPECTED   ng/mg creat   Norhydrocodone                 2688         EXPECTED   ng/mg creat    Sources of hydrocodone include scheduled prescription medications.    Hydromorphone, dihydrocodeine and norhydrocodone are expected    metabolites of hydrocodone. Hydromorphone and dihydrocodeine are    also available as scheduled prescription medications.  Drug Absent but Declared for Prescription Verification   Alprazolam                     Not Detected UNEXPECTED ng/mg  creat ==================================================================== Test                      Result    Flag   Units      Ref Range   Creatinine              119              mg/dL      >=16 ==================================================================== Declared Medications:  The flagging and interpretation on this report are based on the  following declared medications.  Unexpected results may arise from  inaccuracies in the declared medications.   **Note: The testing scope of this panel includes these medications:   Alprazolam (Xanax)  Hydrocodone (Norco)   **Note: The testing scope of this panel does not include the  following reported medications:   Acetaminophen (Norco)  Acyclovir (Zovirax)  Levothyroxine (Synthroid)  Multivitamin  Naloxone (Narcan)  Naproxen (Aleve)  Triamcinolone (Kenalog)  Valacyclovir (Valtrex)  Venlafaxine (Effexor)  Vitamin D3 ==================================================================== For clinical consultation, please call [phone]. ====================================================================       ROS  Constitutional: Denies any fever or chills Gastrointestinal: No reported hemesis, hematochezia, vomiting, or acute GI distress Musculoskeletal: Denies any acute onset joint swelling, redness, loss of ROM, or weakness Neurological: No reported episodes of acute onset apraxia, aphasia, dysarthria, agnosia, amnesia, paralysis, loss of coordination, or loss of consciousness  Medication Review  ALPRAZolam, HYDROcodone-acetaminophen, Vitamin D3, acyclovir ointment, levothyroxine, multivitamin, naloxone, valACYclovir, and venlafaxine XR  History Review  Allergy: Wendy Snyder is allergic to phenergan [promethazine hcl]. Drug: Wendy Snyder  reports no history of drug use. Alcohol:  reports no history of alcohol use. Tobacco:  reports that she has never smoked. She has never used smokeless tobacco. Social: Ms.  Snyder  reports that she has never smoked. She has never used smokeless tobacco. She reports that she does not drink alcohol and does not use drugs. Medical:  has a past medical history of Depression, Hypothyroidism, and Thyroid disease. Surgical: Wendy Snyder  has a past surgical history that includes Spinal cord stimulator implant (2003); Tubal ligation (1990); Fracture surgery (Right); and Breast cyst excision (Left). Family: family history includes Breast cancer in her maternal grandmother; Breast cancer (age of onset: 68) in her mother; Depression in her mother. She was adopted.  Laboratory Chemistry Profile   Renal Lab Results  Component Value Date   BUN 12 01/13/2023   CREATININE 0.82 01/13/2023   BCR 15 01/13/2023   GFRAA 88 12/10/2019   GFRNONAA 76 12/10/2019    Hepatic Lab Results  Component Value Date   AST 23 01/13/2023   ALT 16 01/13/2023  ALBUMIN 4.8 01/13/2023   ALKPHOS 58 01/13/2023    Electrolytes Lab Results  Component Value Date   NA 144 01/13/2023   K 4.0 01/13/2023   CL 104 01/13/2023   CALCIUM 10.5 (H) 01/13/2023   MG 2.1 12/10/2019    Bone Lab Results  Component Value Date   25OHVITD1 84 12/10/2019   25OHVITD2 <1.0 12/10/2019   25OHVITD3 84 12/10/2019    Inflammation (CRP: Acute Phase) (ESR: Chronic Phase) Lab Results  Component Value Date   CRP <1 12/10/2019   ESRSEDRATE 2 12/10/2019         Note: Above Lab results reviewed.  Recent Imaging Review  MM 3D SCREENING MAMMOGRAM BILATERAL BREAST CLINICAL DATA:  Screening.  EXAM: DIGITAL SCREENING BILATERAL MAMMOGRAM WITH TOMOSYNTHESIS AND CAD  TECHNIQUE: Bilateral screening digital craniocaudal and mediolateral oblique mammograms were obtained. Bilateral screening digital breast tomosynthesis was performed. The images were evaluated with computer-aided detection.  COMPARISON:  Previous exam(s).  ACR Breast Density Category c: The breasts are heterogeneously dense, which may obscure  small masses.  FINDINGS: There are no findings suspicious for malignancy.  IMPRESSION: No mammographic evidence of malignancy. A result letter of this screening mammogram will be mailed directly to the patient.  RECOMMENDATION: Screening mammogram in one year. (Code:SM-B-01Y)  BI-RADS CATEGORY  1: Negative.  Electronically Signed   By: Sande Brothers M.D.   On: 02/01/2023 10:22 Note: Reviewed        Physical Exam  General appearance: Well nourished, well developed, and well hydrated. In no apparent acute distress Mental status: Alert, oriented x 3 (person, place, & time)       Respiratory: No evidence of acute respiratory distress Eyes: PERLA Vitals: LMP 10/13/2016 (Within Days)  BMI: Estimated body mass index is 21.93 kg/m as calculated from the following:   Height as of 08/23/23: 5\' 7"  (1.702 m).   Weight as of 08/23/23: 140 lb (63.5 kg). Ideal: Patient weight not recorded  Assessment   Diagnosis Status  1. Chronic pain syndrome   2. Chronic low back pain (1ry area of Pain) (Bilateral) (R>L) w/o sciatica   3. Chronic hip pain (2ry area of Pain) (Right)   4. Chronic wrist pain (3ry area of Pain) (Right) (CRPS)   5. Complex regional pain syndrome type 1 of right upper extremity   6. Pharmacologic therapy   7. Chronic use of opiate for therapeutic purpose   8. Encounter for medication management   9. Encounter for chronic pain management    Controlled Controlled Controlled   Updated Problems: No problems updated.  Plan of Care  Problem-specific:  Assessment and Plan            Ms. CALLEIGH LAFONTANT has a current medication list which includes the following long-term medication(s): hydrocodone-acetaminophen, levothyroxine, naloxone, and venlafaxine xr.  Pharmacotherapy (Medications Ordered): No orders of the defined types were placed in this encounter.  Orders:  No orders of the defined types were placed in this encounter.  Follow-up plan:   No  follow-ups on file.      Interventional Therapies  Risk  Complexity Considerations:   Estimated body mass index is 21.93 kg/m as calculated from the following:   Height as of this encounter: 5\' 7"  (1.702 m).   Weight as of this encounter: 140 lb (63.5 kg). WNL   Planned  Pending:      Under consideration:   Diagnostic bilateral lumbar facet block  Diagnostic IA right hip injection    Completed:  None since before 2020.   Therapeutic  Palliative (PRN) options:   Palliative right stellate ganglion block  Management of Cervical spinal cord stimulator      Recent Visits Date Type Provider Dept  08/23/23 Office Visit Delano Metz, MD [facility]  Showing recent visits within past 90 days and meeting all other requirements Future Appointments Date Type Provider Dept  11/22/23 Appointment Delano Metz, MD [facility]  Showing future appointments within next 90 days and meeting all other requirements  I discussed the assessment and treatment plan with the patient. The patient was provided an opportunity to ask questions and all were answered. The patient agreed with the plan and demonstrated an understanding of the instructions.  Patient advised to call back or seek an in-person evaluation if the symptoms or condition worsens.  Duration of encounter: *** minutes.  Total time on encounter, as per AMA guidelines included both the face-to-face and non-face-to-face time personally spent by the physician and/or other qualified health care professional(s) on the day of the encounter (includes time in activities that require the physician or other qualified health care professional and does not include time in activities normally performed by clinical staff). Physician's time may include the following activities when performed: Preparing to see the patient (e.g., pre-charting review of records, searching for previously ordered imaging, lab work, and nerve  conduction tests) Review of prior analgesic pharmacotherapies. Reviewing PMP Interpreting ordered tests (e.g., lab work, imaging, nerve conduction tests) Performing post-procedure evaluations, including interpretation of diagnostic procedures Obtaining and/or reviewing separately obtained history Performing a medically appropriate examination and/or evaluation Counseling and educating the patient/family/caregiver Ordering medications, tests, or procedures Referring and communicating with other health care professionals (when not separately reported) Documenting clinical information in the electronic or other health record Independently interpreting results (not separately reported) and communicating results to the patient/ family/caregiver Care coordination (not separately reported)  Note by: Oswaldo Done, MD Date: 11/22/2023; Time: 11:11 AM

## 2023-11-22 ENCOUNTER — Ambulatory Visit
Admission: RE | Admit: 2023-11-22 | Discharge: 2023-11-22 | Disposition: A | Discharge status: Home / Self Care | Attending: Pain Medicine | Admitting: Pain Medicine

## 2023-11-22 ENCOUNTER — Ambulatory Visit: Payer: Commercial Managed Care - PPO | Admitting: Pain Medicine

## 2023-11-22 ENCOUNTER — Ambulatory Visit
Admission: RE | Admit: 2023-11-22 | Discharge: 2023-11-22 | Disposition: A | Discharge status: Home / Self Care | Source: Ambulatory Visit | Attending: Pain Medicine | Admitting: Pain Medicine

## 2023-11-22 ENCOUNTER — Encounter: Payer: Self-pay | Admitting: Pain Medicine

## 2023-11-22 VITALS — BP 139/97 | HR 82 | Temp 97.0°F | Ht 67.0 in | Wt 150.0 lb

## 2023-11-22 DIAGNOSIS — Z4542 Encounter for adjustment and management of neuropacemaker (brain) (peripheral nerve) (spinal cord): Secondary | ICD-10-CM | POA: Insufficient documentation

## 2023-11-22 DIAGNOSIS — M7582 Other shoulder lesions, left shoulder: Secondary | ICD-10-CM | POA: Insufficient documentation

## 2023-11-22 DIAGNOSIS — Z9682 Presence of neurostimulator: Secondary | ICD-10-CM | POA: Insufficient documentation

## 2023-11-22 DIAGNOSIS — G90511 Complex regional pain syndrome I of right upper limb: Secondary | ICD-10-CM

## 2023-11-22 DIAGNOSIS — G894 Chronic pain syndrome: Secondary | ICD-10-CM

## 2023-11-22 DIAGNOSIS — M25512 Pain in left shoulder: Secondary | ICD-10-CM

## 2023-11-22 DIAGNOSIS — G9050 Complex regional pain syndrome I, unspecified: Secondary | ICD-10-CM | POA: Insufficient documentation

## 2023-11-22 DIAGNOSIS — Z79891 Long term (current) use of opiate analgesic: Secondary | ICD-10-CM

## 2023-11-22 DIAGNOSIS — M25511 Pain in right shoulder: Secondary | ICD-10-CM

## 2023-11-22 DIAGNOSIS — Z79899 Other long term (current) drug therapy: Secondary | ICD-10-CM | POA: Insufficient documentation

## 2023-11-22 DIAGNOSIS — G8929 Other chronic pain: Secondary | ICD-10-CM

## 2023-11-22 DIAGNOSIS — M25531 Pain in right wrist: Secondary | ICD-10-CM | POA: Insufficient documentation

## 2023-11-22 DIAGNOSIS — T85192S Other mechanical complication of implanted electronic neurostimulator (electrode) of spinal cord, sequela: Secondary | ICD-10-CM | POA: Insufficient documentation

## 2023-11-22 DIAGNOSIS — M545 Low back pain, unspecified: Secondary | ICD-10-CM

## 2023-11-22 DIAGNOSIS — M25551 Pain in right hip: Secondary | ICD-10-CM

## 2023-11-22 DIAGNOSIS — Z9689 Presence of other specified functional implants: Secondary | ICD-10-CM | POA: Insufficient documentation

## 2023-11-22 MED ORDER — HYDROCODONE-ACETAMINOPHEN 10-325 MG PO TABS: 1.0000 | ORAL_TABLET | ORAL | 0 refills | Status: DC | PRN

## 2023-11-22 NOTE — Progress Notes (Signed)
 Nursing Pain Medication Assessment:  Safety precautions to be maintained throughout the outpatient stay will include: orient to surroundings, keep bed in low position, maintain call bell within reach at all times, provide assistance with transfer out of bed and ambulation.  Medication Inspection Compliance: Pill count conducted under aseptic conditions, in front of the patient. Neither the pills nor the bottle was removed from the patient's sight at any time. Once count was completed pills were immediately returned to the patient in their original bottle.  Medication: Hydrocodone/APAP Pill/Patch Count:  82 of 180 pills remain Pill/Patch Appearance: Markings consistent with prescribed medication Bottle Appearance: Standard pharmacy container. Clearly labeled. Filled Date: 2 / 1 / 2025 Last Medication intake:  TodaySafety precautions to be maintained throughout the outpatient stay will include: orient to surroundings, keep bed in low position, maintain call bell within reach at all times, provide assistance with transfer out of bed and ambulation.

## 2023-12-23 ENCOUNTER — Other Ambulatory Visit: Payer: Self-pay | Admitting: Internal Medicine

## 2023-12-23 DIAGNOSIS — E034 Atrophy of thyroid (acquired): Secondary | ICD-10-CM

## 2023-12-25 NOTE — Telephone Encounter (Signed)
 Requested Prescriptions  Pending Prescriptions Disp Refills   levothyroxine (SYNTHROID) 50 MCG tablet [Pharmacy Med Name: LEVOTHYROXINE 50 MCG TABLET] 90 tablet 0    Sig: TAKE 1 TABLET BY MOUTH EVERY DAY     Endocrinology:  Hypothyroid Agents Failed - 12/25/2023  3:36 PM      Failed - Valid encounter within last 12 months    Recent Outpatient Visits   None     Future Appointments             In 3 weeks Wendy Snyder Wendy Cowden, MD [facility] at [facility]            Passed - TSH in normal range and within 360 days    TSH  Date Value Ref Range Status  01/13/2023 1.930 0.450 - 4.500 uIU/mL Final

## 2024-01-07 ENCOUNTER — Other Ambulatory Visit: Payer: Self-pay | Admitting: Internal Medicine

## 2024-01-07 DIAGNOSIS — F064 Anxiety disorder due to known physiological condition: Secondary | ICD-10-CM

## 2024-01-08 NOTE — Telephone Encounter (Signed)
 Requested medication (s) are due for refill today: yes  Requested medication (s) are on the active medication list: yes  Last refill:  09/25/23  Future visit scheduled: yes  Notes to clinic:  Unable to refill per protocol, cannot delegate.      Requested Prescriptions  Pending Prescriptions Disp Refills   ALPRAZolam  (XANAX ) 0.5 MG tablet [Pharmacy Med Name: ALPRAZOLAM  0.5 MG TABLET] 60 tablet 2    Sig: TAKE 1 TABLET BY MOUTH TWICE A DAY AS NEEDED FOR ANXIETY     Not Delegated - Psychiatry: Anxiolytics/Hypnotics 2 Failed - 01/08/2024  2:40 PM      Failed - This refill cannot be delegated      Failed - Urine Drug Screen completed in last 360 days      Failed - Valid encounter within last 6 months    Recent Outpatient Visits   None     Future Appointments             In 1 week Gala Jubilee, [place], MD [facility] at [facility], [facility]            Passed - Patient is not pregnant

## 2024-01-08 NOTE — Telephone Encounter (Signed)
 Please review.  KP

## 2024-01-16 ENCOUNTER — Ambulatory Visit (INDEPENDENT_AMBULATORY_CARE_PROVIDER_SITE_OTHER): Payer: PRIVATE HEALTH INSURANCE | Admitting: Internal Medicine

## 2024-01-16 ENCOUNTER — Encounter: Payer: Self-pay | Admitting: Internal Medicine

## 2024-01-16 VITALS — BP 138/84 | HR 71 | Ht 67.0 in | Wt 157.1 lb

## 2024-01-16 DIAGNOSIS — Z0000 Encounter for general adult medical examination without abnormal findings: Secondary | ICD-10-CM

## 2024-01-16 DIAGNOSIS — B009 Herpesviral infection, unspecified: Secondary | ICD-10-CM | POA: Insufficient documentation

## 2024-01-16 DIAGNOSIS — Z1231 Encounter for screening mammogram for malignant neoplasm of breast: Secondary | ICD-10-CM

## 2024-01-16 DIAGNOSIS — Z13220 Encounter for screening for lipoid disorders: Secondary | ICD-10-CM | POA: Diagnosis not present

## 2024-01-16 DIAGNOSIS — F064 Anxiety disorder due to known physiological condition: Secondary | ICD-10-CM

## 2024-01-16 DIAGNOSIS — F324 Major depressive disorder, single episode, in partial remission: Secondary | ICD-10-CM | POA: Diagnosis not present

## 2024-01-16 DIAGNOSIS — Z79891 Long term (current) use of opiate analgesic: Secondary | ICD-10-CM | POA: Diagnosis not present

## 2024-01-16 DIAGNOSIS — E034 Atrophy of thyroid (acquired): Secondary | ICD-10-CM | POA: Diagnosis not present

## 2024-01-16 MED ORDER — VALACYCLOVIR HCL 1 G PO TABS: 1000.0000 mg | ORAL_TABLET | Freq: Two times a day (BID) | ORAL | 1 refills | Status: AC | PRN

## 2024-01-16 NOTE — Assessment & Plan Note (Signed)
 Doing well on Effexor . Recent job change - now working with her husband in [employer].

## 2024-01-16 NOTE — Assessment & Plan Note (Signed)
 Oral lesions treated with PRN Valtrex 

## 2024-01-16 NOTE — Assessment & Plan Note (Signed)
 Uses Xanax  intermittently - no evidence of misuse or abuse.

## 2024-01-16 NOTE — Assessment & Plan Note (Addendum)
 Supplemented.  Weight is stable, no other complaints. Lab Results  Component Value Date   TSH 1.930 01/13/2023

## 2024-01-16 NOTE — Assessment & Plan Note (Signed)
 Being followed and treated by pain management.
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
## 2024-01-16 NOTE — Patient Instructions (Signed)
 Call [facility] to schedule your mammogram at [phone].

## 2024-01-17 ENCOUNTER — Encounter: Payer: Self-pay | Admitting: Internal Medicine

## 2024-01-17 LAB — CBC WITH DIFFERENTIAL/PLATELET
Basophils Absolute: 0.1 10*3/uL (ref 0.0–0.2)
Basos: 1 %
EOS (ABSOLUTE): 0.2 10*3/uL (ref 0.0–0.4)
Eos: 3 %
Hematocrit: 39.9 % (ref 34.0–46.6)
Hemoglobin: 13.6 g/dL (ref 11.1–15.9)
Immature Grans (Abs): 0 10*3/uL (ref 0.0–0.1)
Immature Granulocytes: 0 %
Lymphocytes Absolute: 1 10*3/uL (ref 0.7–3.1)
Lymphs: 19 %
MCH: 30.8 pg (ref 26.6–33.0)
MCHC: 34.1 g/dL (ref 31.5–35.7)
MCV: 91 fL (ref 79–97)
Monocytes Absolute: 0.4 10*3/uL (ref 0.1–0.9)
Monocytes: 7 %
Neutrophils Absolute: 3.9 10*3/uL (ref 1.4–7.0)
Neutrophils: 70 %
Platelets: 199 10*3/uL (ref 150–450)
RBC: 4.41 x10E6/uL (ref 3.77–5.28)
RDW: 11.9 % (ref 11.7–15.4)
WBC: 5.6 10*3/uL (ref 3.4–10.8)

## 2024-01-17 LAB — COMPREHENSIVE METABOLIC PANEL WITH GFR
ALT: 16 IU/L (ref 0–32)
AST: 22 IU/L (ref 0–40)
Albumin: 4.8 g/dL (ref 3.8–4.9)
Alkaline Phosphatase: 61 IU/L (ref 44–121)
BUN/Creatinine Ratio: 17 (ref 9–23)
BUN: 16 mg/dL (ref 6–24)
Bilirubin Total: 0.3 mg/dL (ref 0.0–1.2)
CO2: 21 mmol/L (ref 20–29)
Calcium: 10 mg/dL (ref 8.7–10.2)
Chloride: 105 mmol/L (ref 96–106)
Creatinine, Ser: 0.93 mg/dL (ref 0.57–1.00)
Globulin, Total: 1.8 g/dL (ref 1.5–4.5)
Glucose: 98 mg/dL (ref 70–99)
Potassium: 5.1 mmol/L (ref 3.5–5.2)
Sodium: 140 mmol/L (ref 134–144)
Total Protein: 6.6 g/dL (ref 6.0–8.5)
eGFR: 72 mL/min/{1.73_m2} (ref >59)

## 2024-01-17 LAB — LIPID PANEL
Chol/HDL Ratio: 1.9 ratio (ref 0.0–4.4)
Cholesterol, Total: 189 mg/dL (ref 100–199)
HDL: 98 mg/dL (ref >39)
LDL Chol Calc (NIH): 80 mg/dL (ref 0–99)
Triglycerides: 59 mg/dL (ref 0–149)
VLDL Cholesterol Cal: 11 mg/dL (ref 5–40)

## 2024-01-17 LAB — TSH+FREE T4
Free T4: 1.28 ng/dL (ref 0.82–1.77)
TSH: 2.78 u[IU]/mL (ref 0.450–4.500)

## 2024-02-16 ENCOUNTER — Other Ambulatory Visit: Payer: Self-pay | Admitting: Internal Medicine

## 2024-02-16 DIAGNOSIS — F064 Anxiety disorder due to known physiological condition: Secondary | ICD-10-CM

## 2024-02-19 ENCOUNTER — Ambulatory Visit: Payer: PRIVATE HEALTH INSURANCE | Attending: Nurse Practitioner | Admitting: Nurse Practitioner

## 2024-02-19 ENCOUNTER — Encounter: Admitting: Pain Medicine

## 2024-02-19 ENCOUNTER — Encounter: Payer: Self-pay | Admitting: Nurse Practitioner

## 2024-02-19 ENCOUNTER — Other Ambulatory Visit: Payer: Self-pay | Admitting: Internal Medicine

## 2024-02-19 DIAGNOSIS — G894 Chronic pain syndrome: Secondary | ICD-10-CM

## 2024-02-19 DIAGNOSIS — Z79891 Long term (current) use of opiate analgesic: Secondary | ICD-10-CM | POA: Diagnosis present

## 2024-02-19 DIAGNOSIS — M25531 Pain in right wrist: Secondary | ICD-10-CM

## 2024-02-19 DIAGNOSIS — G8929 Other chronic pain: Secondary | ICD-10-CM | POA: Diagnosis present

## 2024-02-19 DIAGNOSIS — G90511 Complex regional pain syndrome I of right upper limb: Secondary | ICD-10-CM

## 2024-02-19 DIAGNOSIS — Z79899 Other long term (current) drug therapy: Secondary | ICD-10-CM

## 2024-02-19 DIAGNOSIS — F064 Anxiety disorder due to known physiological condition: Secondary | ICD-10-CM

## 2024-02-19 DIAGNOSIS — M545 Low back pain, unspecified: Secondary | ICD-10-CM | POA: Diagnosis present

## 2024-02-19 DIAGNOSIS — M25551 Pain in right hip: Secondary | ICD-10-CM | POA: Diagnosis not present

## 2024-02-19 MED ORDER — HYDROCODONE-ACETAMINOPHEN 10-325 MG PO TABS: 1.0000 | ORAL_TABLET | ORAL | 0 refills | Status: DC | PRN

## 2024-02-19 NOTE — Progress Notes (Signed)
 Nursing Pain Medication Assessment:  Safety precautions to be maintained throughout the outpatient stay will include: orient to surroundings, keep bed in low position, maintain call bell within reach at all times, provide assistance with transfer out of bed and ambulation.  Medication Inspection Compliance: Pill count conducted under aseptic conditions, in front of the patient. Neither the pills nor the bottle was removed from the patient's sight at any time. Once count was completed pills were immediately returned to the patient in their original bottle.  Medication: Hydrocodone /APAP Pill/Patch Count: 96 of 180 pills/patches remain Pill/Patch Appearance: Markings consistent with prescribed medication Bottle Appearance: Standard pharmacy container. Clearly labeled. Filled Date: 5 / 29 / 2025 Last Medication intake:  Today  Safety precautions to be maintained throughout the outpatient stay will include: orient to surroundings, keep bed in low position, maintain call bell within reach at all times, provide assistance with transfer out of bed and ambulation.

## 2024-02-19 NOTE — Progress Notes (Signed)
 PROVIDER NOTE: Interpretation of information contained herein should be left to medically-trained personnel. Specific patient instructions are provided elsewhere under "Patient Instructions" section of medical record. This document was created in part using AI and STT-dictation technology, any transcriptional errors that may result from this process are unintentional.  Patient: Wendy Snyder  Service: E/M   PCP: Sheron Dixons, MD  DOB: 05/05/1966  DOS: 02/19/2024  Provider: Cherylin Corrigan, Wendy Snyder  MRN: [id number]  Delivery: Face-to-face  Specialty: Interventional Pain Management  Type: Established Patient  Setting: Ambulatory outpatient facility  Specialty designation: 09  Referring Prov.: Sheron Dixons, MD  Location: Outpatient office facility       History of present illness (HPI) [name], a 58 y.o. year old female, is here today because of her No primary diagnosis found.. Wendy Snyder primary complain today is Back Pain, Hip Pain (left), and Shoulder Pain (right)  Pertinent problems: Wendy Snyder does not have any pertinent problems on file.  Pain Assessment: Severity of Chronic pain is reported as a 1 /10. Location: Back Lower/left hip. Onset: More than a month ago. Quality: Aching. Timing: Constant. Modifying factor(s): meds. Vitals:  height is 5\' 7"  (1.702 m) and weight is 150 lb (68 kg). Her temperature is 97.4 F (36.3 C) (abnormal). Her blood pressure is 127/90 (abnormal) and her pulse is 79. Her respiration is 16 and oxygen saturation is 100%.  BMI: Estimated body mass index is 23.49 kg/m as calculated from the following:   Height as of this encounter: 5\' 7"  (1.702 m).   Weight as of this encounter: 150 lb (68 kg).  Last encounter: 11/22/2023 Last procedure: Visit date not found.  Reason for encounter: medication management. The patient indicates doing well with current medication regimen. No side effects or adverse reaction reported to medication. The  patient complain of lower back pain and bilateral shoulder pain. Shoulders X-rays reviewed with patient.   Pharmacotherapy Assessment  Analgesic: Hydrocodone -acetaminophen  10-325 mg every four hours as needed for severe pain. MME=60 Monitoring: [place] PMP: PDMP reviewed during this encounter.       Pharmacotherapy: No side-effects or adverse reactions reported. Compliance: No problems identified. Effectiveness: Clinically acceptable.  Wendy Rabon, Wendy Snyder  02/19/2024  8:43 AM  Sign when Signing Visit Nursing Pain Medication Assessment:  Safety precautions to be maintained throughout the outpatient stay will include: orient to surroundings, keep bed in low position, maintain call bell within reach at all times, provide assistance with transfer out of bed and ambulation.  Medication Inspection Compliance: Pill count conducted under aseptic conditions, in front of the patient. Neither the pills nor the bottle was removed from the patient's sight at any time. Once count was completed pills were immediately returned to the patient in their original bottle.  Medication: Hydrocodone /APAP Pill/Patch Count: 96 of 180 pills/patches remain Pill/Patch Appearance: Markings consistent with prescribed medication Bottle Appearance: Standard pharmacy container. Clearly labeled. Filled Date: 5 / 70 / 2025 Last Medication intake:  Today  Safety precautions to be maintained throughout the outpatient stay will include: orient to surroundings, keep bed in low position, maintain call bell within reach at all times, provide assistance with transfer out of bed and ambulation.     No results found for: "CBDTHCR" No results found for: "D8THCCBX" No results found for: "D9THCCBX"  UDS:  Summary  Date Value Ref Range Status  01/18/2023 Note  Final    Comment:    ==================================================================== [employer] Select 13  (MW) ==================================================================== Test  Result       Flag       Units  Drug Present and Declared for Prescription Verification   Hydrocodone                     901          EXPECTED   ng/mg creat   Hydromorphone                  452          EXPECTED   ng/mg creat   Dihydrocodeine                 218          EXPECTED   ng/mg creat   Norhydrocodone                 2688         EXPECTED   ng/mg creat    Sources of hydrocodone  include scheduled prescription medications.    Hydromorphone, dihydrocodeine and norhydrocodone are expected    metabolites of hydrocodone . Hydromorphone and dihydrocodeine are    also available as scheduled prescription medications.  Drug Absent but Declared for Prescription Verification   Alprazolam                      Not Detected UNEXPECTED ng/mg creat ==================================================================== Test                      Result    Flag   Units      Ref Range   Creatinine              119              mg/dL      >=16 ==================================================================== Declared Medications:  The flagging and interpretation on this report are based on the  following declared medications.  Unexpected results may arise from  inaccuracies in the declared medications.   **Note: The testing scope of this panel includes these medications:   Alprazolam  (Xanax )  Hydrocodone  (Norco)   **Note: The testing scope of this panel does not include the  following reported medications:   Acetaminophen  (Norco)  Acyclovir  (Zovirax )  Levothyroxine  (Synthroid )  Multivitamin  Naloxone  (Narcan )  Naproxen (Aleve)  Triamcinolone  (Kenalog )  Valacyclovir  (Valtrex )  Venlafaxine  (Effexor )  Vitamin D3 ==================================================================== For clinical consultation, please call [phone]. ====================================================================      ROS  Constitutional: Denies any fever or chills Gastrointestinal: No reported hemesis, hematochezia, vomiting, or acute GI distress Musculoskeletal: bilateral shoulder pain, lower back pain Neurological: No reported episodes of acute onset apraxia, aphasia, dysarthria, agnosia, amnesia, paralysis, loss of coordination, or loss of consciousness  Medication Review  ALPRAZolam , HYDROcodone -acetaminophen , Vitamin D3, acyclovir  ointment, levothyroxine , multivitamin, naloxone , valACYclovir , and venlafaxine  XR  History Review  Allergy: Wendy Snyder is allergic to phenergan [promethazine hcl]. Drug: Wendy Snyder  reports no history of drug use. Alcohol:  reports no history of alcohol use. Tobacco:  reports that she has never smoked. She has never used smokeless tobacco. Social: Wendy Snyder  reports that she has never smoked. She has never used smokeless tobacco. She reports that she does not drink alcohol and does not use drugs. Medical:  has a past medical history of Depression, Hypothyroidism, and Thyroid disease. Surgical: Wendy Snyder  has a past surgical history that includes Spinal cord stimulator implant (2003); Tubal ligation (1990); Fracture surgery (Right); and Breast cyst excision (Left). Family:  family history includes Breast cancer in her maternal grandmother; Breast cancer (age of onset: 31) in her mother; Depression in her mother. She was adopted.  Laboratory Chemistry Profile   Renal Lab Results  Component Value Date   BUN 16 01/16/2024   CREATININE 0.93 01/16/2024   BCR 17 01/16/2024   GFRAA 88 12/10/2019   GFRNONAA 76 12/10/2019    Hepatic Lab Results  Component Value Date   AST 22 01/16/2024   ALT 16 01/16/2024   ALBUMIN 4.8 01/16/2024   ALKPHOS 61 01/16/2024    Electrolytes Lab Results  Component Value Date   NA 140 01/16/2024   K 5.1 01/16/2024   CL 105 01/16/2024    CALCIUM 10.0 01/16/2024   MG 2.1 12/10/2019    Bone Lab Results  Component Value Date   25OHVITD1 84 12/10/2019   25OHVITD2 <1.0 12/10/2019   25OHVITD3 84 12/10/2019    Inflammation (CRP: Acute Phase) (ESR: Chronic Phase) Lab Results  Component Value Date   CRP <1 12/10/2019   ESRSEDRATE 2 12/10/2019         Note: Above Lab results reviewed.  Recent Imaging Review  DG Shoulder Left CLINICAL DATA:  Chronic bilateral shoulder pain.  EXAM: LEFT SHOULDER - 2+ VIEW  COMPARISON:  None Available.  FINDINGS: There is diffuse decreased bone mineralization. Moderate to severe glenohumeral joint space narrowing. Mild acromioclavicular joint space narrowing and peripheral osteophytosis. No acute fracture or dislocation.  IMPRESSION: Moderate to severe glenohumeral and mild acromioclavicular osteoarthritis.  Electronically Signed   By: Bertina Broccoli M.D.   On: 12/03/2023 13:57 DG Shoulder Right CLINICAL DATA:  Chronic right shoulder pain.  EXAM: RIGHT SHOULDER - 2+ VIEW  COMPARISON:  Right shoulder radiographs 12/25/2017  FINDINGS: Resolution of the lucency previously questioned as an acute fracture of the inferior glenoid. On axillary view there may be mild 2 mm cortical depression at the anterior aspect of the glenoid, presumably from the same prior remote fracture. Mild inferior glenohumeral joint space narrowing. Mild inferior, posterior, and anterior glenoid degenerative osteophytosis. Minimal acromioclavicular joint space narrowing and peripheral osteophytosis. No acute fracture is seen. No dislocation. Nonspecific catheter overlies the right hemithorax on oblique view.  IMPRESSION: 1. Mild glenohumeral and minimal acromioclavicular osteoarthritis. 2. Interval healing of the previously questioned inferior glenoid fracture.  Electronically Signed   By: Bertina Broccoli M.D.   On: 12/03/2023 13:56 Note: Reviewed         Physical Exam  General appearance:  Well nourished, well developed, and well hydrated. In no apparent acute distress Mental status: Alert, oriented x 3 (person, place, & time)       Respiratory: No evidence of acute respiratory distress Eyes: PERLA Vitals: BP (!) 127/90   Pulse 79   Temp (!) 97.4 F (36.3 C)   Resp 16   Ht 5\' 7"  (1.702 m)   Wt 150 lb (68 kg)   LMP 10/13/2016 (Within Days)   SpO2 100%   BMI 23.49 kg/m  BMI: Estimated body mass index is 23.49 kg/m as calculated from the following:   Height as of this encounter: 5\' 7"  (1.702 m).   Weight as of this encounter: 150 lb (68 kg). Ideal: Ideal body weight: 61.6 kg (135 lb 12.9 oz) Adjusted ideal body weight: 64.2 kg (141 lb 7.7 oz)  Assessment   Diagnosis Status  1. Chronic low back pain (1ry area of Pain) (Bilateral) (R>L) w/o sciatica   2. Chronic hip pain (2ry area of Pain) (Right)  3. Chronic wrist pain (3ry area of Pain) (Right) (CRPS)   4. Complex regional pain syndrome type 1 of right upper extremity   5. Chronic pain syndrome   6. Pharmacologic therapy   7. Chronic use of opiate for therapeutic purpose   8. Encounter for medication management   9. Encounter for chronic pain management    Controlled Controlled Controlled   Updated Problems: No problems updated.  Plan of Care  Problem-specific:  Assessment and Plan We will continue on current medication regimen. Prescribing Drug Monitoring (PDMP) reviewed; findings consistent with use of prescribed medication and no evidence of narcotic misuse or abuse. Routine UDS ordered today. Patient expressed interest in receiving interventional therapy and requested evaluation by Wendy Snyder. Schedule follow up in 90 days with Wendy Rufus, Wendy Snyder for medication management. Advice patient to schedule at the front desk with Dr. Naveira.   Wendy Snyder has a current medication list which includes the following long-term medication(s): levothyroxine , naloxone , venlafaxine  xr, [START ON 03/05/2024]  hydrocodone -acetaminophen , [START ON 04/04/2024] hydrocodone -acetaminophen , and [START ON 05/04/2024] hydrocodone -acetaminophen .  Pharmacotherapy (Medications Ordered): Meds ordered this encounter  Medications   HYDROcodone -acetaminophen  (NORCO) 10-325 MG tablet    Sig: Take 1 tablet by mouth every 4 (four) hours as needed for severe pain (pain score 7-10). Must last 30 days    Dispense:  180 tablet    Refill:  0    DO NOT: delete (not duplicate); no partial-fill (will deny script to complete), no refill request (F/U required). DISPENSE: 1 day early if closed on fill date. WARN: No CNS-depressants within 8 hrs of med.   HYDROcodone -acetaminophen  (NORCO) 10-325 MG tablet    Sig: Take 1 tablet by mouth every 4 (four) hours as needed for severe pain (pain score 7-10). Must last 30 days    Dispense:  180 tablet    Refill:  0    DO NOT: delete (not duplicate); no partial-fill (will deny script to complete), no refill request (F/U required). DISPENSE: 1 day early if closed on fill date. WARN: No CNS-depressants within 8 hrs of med.   HYDROcodone -acetaminophen  (NORCO) 10-325 MG tablet    Sig: Take 1 tablet by mouth every 4 (four) hours as needed for severe pain (pain score 7-10). Must last 30 days    Dispense:  180 tablet    Refill:  0    DO NOT: delete (not duplicate); no partial-fill (will deny script to complete), no refill request (F/U required). DISPENSE: 1 day early if closed on fill date. WARN: No CNS-depressants within 8 hrs of med.   Orders:  Orders Placed This Encounter  Procedures   Tox[employer] Select 13 (MW), Urine    Volume: 30 ml(s). Minimum 3 ml of urine is needed. Document temperature of fresh sample. Indications: Long term (current) use of opiate analgesic (Z30.865)    Release to patient:   Immediate        Return in about 3 months (around 05/21/2024) for (F2F), (MM), Marthe Slain Wendy Snyder.    Recent Visits Date Type Provider Dept  11/22/23 Office Visit Renaldo Caroli, MD  [facility]  Showing recent visits within past 90 days and meeting all other requirements Today's Visits Date Type Provider Dept  02/19/24 Office Visit Keliyah Spillman K, Wendy Snyder [facility]  Showing today's visits and meeting all other requirements Future Appointments No visits were found meeting these conditions. Showing future appointments within next 90 days and meeting all other requirements  I discussed the assessment and  treatment plan with the patient. The patient was provided an opportunity to ask questions and all were answered. The patient agreed with the plan and demonstrated an understanding of the instructions.  Patient advised to call back or seek an in-person evaluation if the symptoms or condition worsens.  Duration of encounter: 30 minutes.  Total time on encounter, as per AMA guidelines included both the face-to-face and non-face-to-face time personally spent by the physician and/or other qualified health care professional(s) on the day of the encounter (includes time in activities that require the physician or other qualified health care professional and does not include time in activities normally performed by clinical staff). Physician's time may include the following activities when performed: Preparing to see the patient (e.g., pre-charting review of records, searching for previously ordered imaging, lab work, and nerve conduction tests) Review of prior analgesic pharmacotherapies. Reviewing PMP Interpreting ordered tests (e.g., lab work, imaging, nerve conduction tests) Performing post-procedure evaluations, including interpretation of diagnostic procedures Obtaining and/or reviewing separately obtained history Performing a medically appropriate examination and/or evaluation Counseling and educating the patient/family/caregiver Ordering medications, tests, or procedures Referring and communicating with other health care professionals (when not separately  reported) Documenting clinical information in the electronic or other health record Independently interpreting results (not separately reported) and communicating results to the patient/ family/caregiver Care coordination (not separately reported)  Note by: Tanish Prien K Ramiel Forti, Wendy Snyder (TTS and AI technology used. I apologize for any typographical errors that were not detected and corrected.) Date: 02/19/2024; Time: 9:01 AM

## 2024-02-20 NOTE — Telephone Encounter (Signed)
 Requested medications are due for refill today.  yes  Requested medications are on the active medications list.  yes  Last refill. 01/08/2024 #60 0 rf  Future visit scheduled.   no  Notes to clinic.  Refill not delegated.    Requested Prescriptions  Pending Prescriptions Disp Refills   ALPRAZolam  (XANAX ) 0.5 MG tablet [Pharmacy Med Name: ALPRAZOLAM  0.5 MG TABLET] 60 tablet 0    Sig: TAKE 1 TABLET BY MOUTH TWICE A DAY AS NEEDED FOR ANXIETY     Not Delegated - Psychiatry: Anxiolytics/Hypnotics 2 Failed - 02/20/2024  4:15 PM      Failed - This refill cannot be delegated      Failed - Urine Drug Screen completed in last 360 days      Passed - Patient is not pregnant      Passed - Valid encounter within last 6 months    Recent Outpatient Visits           1 month ago Annual physical exam   [facility] at [facility], Chales [place], MD

## 2024-02-21 LAB — TOXASSURE SELECT 13 (MW), URINE

## 2024-03-22 ENCOUNTER — Other Ambulatory Visit: Payer: Self-pay | Admitting: Internal Medicine

## 2024-03-22 DIAGNOSIS — E034 Atrophy of thyroid (acquired): Secondary | ICD-10-CM

## 2024-03-24 NOTE — Progress Notes (Unsigned)
 PROVIDER NOTE: Interpretation of information contained herein should be left to medically-trained personnel. Specific patient instructions are provided elsewhere under Patient Instructions section of medical record. This document was created in part using AI and STT-dictation technology, any transcriptional errors that may result from this process are unintentional.  Patient: Wendy Snyder  Service: E/M   PCP: Justus Leita DEL, MD  DOB: Aug 09, 1966  DOS: 03/25/2024  Provider: Eric DELENA Como, MD  MRN: [id number]  Delivery: Face-to-face  Specialty: Interventional Pain Management  Type: Established Patient  Setting: Ambulatory outpatient facility  Specialty designation: 09  Referring Prov.: Justus Leita DEL, MD  Location: Outpatient office facility       History of present illness (HPI) Wendy Snyder, a 58 y.o. year old female, is here today because of her No primary diagnosis found.. Ms. Paule primary complain today is No chief complaint on file.  Pertinent problems: Wendy Snyder has Chronic pain syndrome; Spinal cord stimulator in situ (Rechargable Medtronic Neurostimulator) (Cervical Epidural Leads); Chronic upper extremity pain (Right); RSD (reflex sympathetic dystrophy) (right upper extremity); Chronic low back pain (1ry area of Pain) (Bilateral) (R>L) w/o sciatica; Chronic hip pain (2ry area of Pain) (Right); Chronic wrist pain (3ry area of Pain) (Right) (CRPS); Chronic shoulder pain (Right); CRPS (complex regional pain syndrome), type I, upper (Right); Chronic pain of left knee; Primary osteoarthritis of left knee; Rotator cuff tendinitis, left; and Chronic shoulder pain (Bilateral) on their pertinent problem list.  Pain Assessment: Severity of   is reported as a  /10. Location:    / . Onset:  . Quality:  . Timing:  . Modifying factor(s):  SABRA Vitals:  vitals were not taken for this visit.  BMI: Estimated body mass index is 23.49 kg/m as calculated from the following:    Height as of 02/19/24: 5' 7 (1.702 m).   Weight as of 02/19/24: 150 lb (68 kg).  Last encounter: 11/22/2023. Last procedure: Visit date not found.  Reason for encounter:  *** .   Discussed the use of AI scribe software for clinical note transcription with the patient, who gave verbal consent to proceed.  History of Present Illness           Pharmacotherapy Assessment   Hydrocodone /APAP 10/325, 1 tab PO q 4 hrs (60 mg/day of hydrocodone ) MME/day: 60 mg/day.   Monitoring: [place] PMP: PDMP reviewed during this encounter.       Pharmacotherapy: No side-effects or adverse reactions reported. Compliance: No problems identified. Effectiveness: Clinically acceptable.  No notes on file  UDS:  Summary  Date Value Ref Range Status  02/19/2024 FINAL  Final    Comment:    ==================================================================== [employer] (MW) ==================================================================== Test                             Result       Flag       Units  Drug Present and Declared for Prescription Verification   Hydrocodone                     2028         EXPECTED   ng/mg creat   Hydromorphone                  351          EXPECTED   ng/mg creat   Dihydrocodeine  153          EXPECTED   ng/mg creat   Norhydrocodone                 >1302        EXPECTED   ng/mg creat    Sources of hydrocodone  include scheduled prescription medications.    Hydromorphone, dihydrocodeine and norhydrocodone are expected    metabolites of hydrocodone . Hydromorphone and dihydrocodeine are    also available as scheduled prescription medications.  Drug Absent but Declared for Prescription Verification   Alprazolam                      Not Detected UNEXPECTED ng/mg creat ==================================================================== Test                      Result    Flag   Units      Ref Range   Creatinine              384              mg/dL       >=79 ==================================================================== Declared Medications:  The flagging and interpretation on this report are based on the  following declared medications.  Unexpected results may arise from  inaccuracies in the declared medications.   **Note: The testing scope of this panel includes these medications:   Alprazolam  (Xanax )  Hydrocodone  (Norco)   **Note: The testing scope of this panel does not include the  following reported medications:   Acetaminophen  (Norco)  Acyclovir  (Zovirax )  Levothyroxine  (Synthroid )  Multivitamin  Naloxone  (Narcan )  Valacyclovir  (Valtrex )  Venlafaxine  (Effexor )  Vitamin D3 ==================================================================== For clinical consultation, please call [phone]. ====================================================================     No results found for: CBDTHCR No results found for: D8THCCBX No results found for: D9THCCBX  ROS  Constitutional: Denies any fever or chills Gastrointestinal: No reported hemesis, hematochezia, vomiting, or acute GI distress Musculoskeletal: Denies any acute onset joint swelling, redness, loss of ROM, or weakness Neurological: No reported episodes of acute onset apraxia, aphasia, dysarthria, agnosia, amnesia, paralysis, loss of coordination, or loss of consciousness  Medication Review  ALPRAZolam , HYDROcodone -acetaminophen , Vitamin D3, acyclovir  ointment, levothyroxine , multivitamin, naloxone , valACYclovir , and venlafaxine  XR  History Review  Allergy: Wendy Snyder is allergic to phenergan [promethazine hcl]. Drug: Wendy Snyder  reports no history of drug use. Alcohol:  reports no history of alcohol use. Tobacco:  reports that she has never smoked. She has never used smokeless tobacco. Social: Wendy Snyder  reports that she has never smoked. She has never used smokeless tobacco. She reports that she does not drink alcohol and does not use  drugs. Medical:  has a past medical history of Depression, Hypothyroidism, and Thyroid disease. Surgical: Wendy Snyder  has a past surgical history that includes Spinal cord stimulator implant (2003); Tubal ligation (1990); Fracture surgery (Right); and Breast cyst excision (Left). Family: family history includes Breast cancer in her maternal grandmother; Breast cancer (age of onset: 35) in her mother; Depression in her mother. She was adopted.  Laboratory Chemistry Profile   Renal Lab Results  Component Value Date   BUN 16 01/16/2024   CREATININE 0.93 01/16/2024   BCR 17 01/16/2024   GFRAA 88 12/10/2019   GFRNONAA 76 12/10/2019    Hepatic Lab Results  Component Value Date   AST 22 01/16/2024   ALT 16 01/16/2024   ALBUMIN 4.8 01/16/2024   ALKPHOS 61 01/16/2024    Electrolytes Lab Results  Component Value Date   NA 140 01/16/2024   K 5.1 01/16/2024   CL 105 01/16/2024   CALCIUM 10.0 01/16/2024   MG 2.1 12/10/2019    Bone Lab Results  Component Value Date   25OHVITD1 84 12/10/2019   25OHVITD2 <1.0 12/10/2019   25OHVITD3 84 12/10/2019    Inflammation (CRP: Acute Phase) (ESR: Chronic Phase) Lab Results  Component Value Date   CRP <1 12/10/2019   ESRSEDRATE 2 12/10/2019         Note: Above Lab results reviewed.  Recent Imaging Review  DG Shoulder Left CLINICAL DATA:  Chronic bilateral shoulder pain.  EXAM: LEFT SHOULDER - 2+ VIEW  COMPARISON:  None Available.  FINDINGS: There is diffuse decreased bone mineralization. Moderate to severe glenohumeral joint space narrowing. Mild acromioclavicular joint space narrowing and peripheral osteophytosis. No acute fracture or dislocation.  IMPRESSION: Moderate to severe glenohumeral and mild acromioclavicular osteoarthritis.  Electronically Signed   By: Tanda Lyons M.D.   On: 12/03/2023 13:57 DG Shoulder Right CLINICAL DATA:  Chronic right shoulder pain.  EXAM: RIGHT SHOULDER - 2+ VIEW  COMPARISON:  Right  shoulder radiographs 12/25/2017  FINDINGS: Resolution of the lucency previously questioned as an acute fracture of the inferior glenoid. On axillary view there may be mild 2 mm cortical depression at the anterior aspect of the glenoid, presumably from the same prior remote fracture. Mild inferior glenohumeral joint space narrowing. Mild inferior, posterior, and anterior glenoid degenerative osteophytosis. Minimal acromioclavicular joint space narrowing and peripheral osteophytosis. No acute fracture is seen. No dislocation. Nonspecific catheter overlies the right hemithorax on oblique view.  IMPRESSION: 1. Mild glenohumeral and minimal acromioclavicular osteoarthritis. 2. Interval healing of the previously questioned inferior glenoid fracture.  Electronically Signed   By: Tanda Lyons M.D.   On: 12/03/2023 13:56 Note: Reviewed        Physical Exam  Vitals: LMP 10/13/2016 (Within Days)  BMI: Estimated body mass index is 23.49 kg/m as calculated from the following:   Height as of 02/19/24: 5' 7 (1.702 m).   Weight as of 02/19/24: 150 lb (68 kg). Ideal: Patient weight not recorded General appearance: Well nourished, well developed, and well hydrated. In no apparent acute distress Mental status: Alert, oriented x 3 (person, place, & time)       Respiratory: No evidence of acute respiratory distress Eyes: PERLA   Assessment   Diagnosis Status  No diagnosis found. Controlled Controlled Controlled   Updated Problems: No problems updated.  Plan of Care  Problem-specific:  Assessment and Plan            Ms. KUUIPO ANZALDO has a current medication list which includes the following long-term medication(s): hydrocodone -acetaminophen , [START ON 04/04/2024] hydrocodone -acetaminophen , [START ON 05/04/2024] hydrocodone -acetaminophen , levothyroxine , naloxone , and venlafaxine  xr.  Pharmacotherapy (Medications Ordered): No orders of the defined types were placed in this  encounter.  Orders:  No orders of the defined types were placed in this encounter.    Interventional Therapies  Risk  Complexity Considerations:   Estimated body mass index is 21.93 kg/m as calculated from the following:   Height as of this encounter: 5' 7 (1.702 m).   Weight as of this encounter: 140 lb (63.5 kg). WNL   Planned  Pending:      Under consideration:   Diagnostic bilateral lumbar facet block  Diagnostic IA right hip injection    Completed:   None since before 2020.   Therapeutic  Palliative (PRN) options:   Palliative right stellate  ganglion block  Management of Cervical spinal cord stimulator      No follow-ups on file.    Recent Visits Date Type Provider Dept  02/19/24 Office Visit Patel, Seema K, NP [facility]  Showing recent visits within past 90 days and meeting all other requirements Future Appointments Date Type Provider Dept  03/25/24 Appointment Tanya Glisson, MD [facility]  05/21/24 Appointment Patel, Seema K, NP [facility]  Showing future appointments within next 90 days and meeting all other requirements  I discussed the assessment and treatment plan with the patient. The patient was provided an opportunity to ask questions and all were answered. The patient agreed with the plan and demonstrated an understanding of the instructions.  Patient advised to call back or seek an in-person evaluation if the symptoms or condition worsens.  Duration of encounter: *** minutes.  Total time on encounter, as per AMA guidelines included both the face-to-face and non-face-to-face time personally spent by the physician and/or other qualified health care professional(s) on the day of the encounter (includes time in activities that require the physician or other qualified health care professional and does not include time in activities normally performed by clinical staff). Physician's time may include the following  activities when performed: Preparing to see the patient (e.g., pre-charting review of records, searching for previously ordered imaging, lab work, and nerve conduction tests) Review of prior analgesic pharmacotherapies. Reviewing PMP Interpreting ordered tests (e.g., lab work, imaging, nerve conduction tests) Performing post-procedure evaluations, including interpretation of diagnostic procedures Obtaining and/or reviewing separately obtained history Performing a medically appropriate examination and/or evaluation Counseling and educating the patient/family/caregiver Ordering medications, tests, or procedures Referring and communicating with other health care professionals (when not separately reported) Documenting clinical information in the electronic or other health record Independently interpreting results (not separately reported) and communicating results to the patient/ family/caregiver Care coordination (not separately reported)  Note by: Glisson DELENA Tanya, MD (TTS and AI technology used. I apologize for any typographical errors that were not detected and corrected.) Date: 03/25/2024; Time: 1:17 PM

## 2024-03-25 ENCOUNTER — Ambulatory Visit (HOSPITAL_BASED_OUTPATIENT_CLINIC_OR_DEPARTMENT_OTHER): Payer: PRIVATE HEALTH INSURANCE | Admitting: Pain Medicine

## 2024-03-25 DIAGNOSIS — M19011 Primary osteoarthritis, right shoulder: Secondary | ICD-10-CM | POA: Insufficient documentation

## 2024-03-25 DIAGNOSIS — Z91199 Patient's noncompliance with other medical treatment and regimen due to unspecified reason: Secondary | ICD-10-CM

## 2024-03-25 DIAGNOSIS — G8929 Other chronic pain: Secondary | ICD-10-CM

## 2024-03-25 NOTE — Patient Instructions (Signed)

## 2024-03-26 NOTE — Telephone Encounter (Signed)
 Requested Prescriptions  Pending Prescriptions Disp Refills   levothyroxine  (SYNTHROID ) 50 MCG tablet [Pharmacy Med Name: LEVOTHYROXINE  50 MCG TABLET] 90 tablet 2    Sig: TAKE 1 TABLET BY MOUTH EVERY DAY     Endocrinology:  Hypothyroid Agents Passed - 03/26/2024 11:20 AM      Passed - TSH in normal range and within 360 days    TSH  Date Value Ref Range Status  01/16/2024 2.780 0.450 - 4.500 uIU/mL Final         Passed - Valid encounter within last 12 months    Recent Outpatient Visits           2 months ago Annual physical exam   [facility], Leita DEL, MD

## 2024-04-02 ENCOUNTER — Encounter: Payer: Self-pay | Admitting: Internal Medicine

## 2024-04-02 NOTE — Telephone Encounter (Signed)
 Please review and advise.   JM

## 2024-04-15 ENCOUNTER — Other Ambulatory Visit: Payer: Self-pay | Admitting: Medical Genetics

## 2024-04-15 ENCOUNTER — Other Ambulatory Visit: Payer: Self-pay | Admitting: Internal Medicine

## 2024-04-16 ENCOUNTER — Other Ambulatory Visit: Payer: Self-pay

## 2024-04-16 DIAGNOSIS — R21 Rash and other nonspecific skin eruption: Secondary | ICD-10-CM

## 2024-04-16 MED ORDER — TRIAMCINOLONE ACETONIDE 0.5 % EX CREA: TOPICAL_CREAM | Freq: Three times a day (TID) | CUTANEOUS | 0 refills | Status: DC

## 2024-05-18 ENCOUNTER — Other Ambulatory Visit: Payer: Self-pay | Admitting: Internal Medicine

## 2024-05-18 DIAGNOSIS — F064 Anxiety disorder due to known physiological condition: Secondary | ICD-10-CM

## 2024-05-21 ENCOUNTER — Ambulatory Visit: Payer: PRIVATE HEALTH INSURANCE | Attending: Nurse Practitioner | Admitting: Nurse Practitioner

## 2024-05-21 ENCOUNTER — Encounter: Payer: Self-pay | Admitting: Nurse Practitioner

## 2024-05-21 VITALS — BP 121/93 | HR 81 | Temp 97.5°F | Ht 67.0 in | Wt 145.0 lb

## 2024-05-21 DIAGNOSIS — M545 Low back pain, unspecified: Secondary | ICD-10-CM | POA: Insufficient documentation

## 2024-05-21 DIAGNOSIS — G8929 Other chronic pain: Secondary | ICD-10-CM | POA: Diagnosis present

## 2024-05-21 DIAGNOSIS — M19011 Primary osteoarthritis, right shoulder: Secondary | ICD-10-CM | POA: Diagnosis present

## 2024-05-21 DIAGNOSIS — Z79899 Other long term (current) drug therapy: Secondary | ICD-10-CM | POA: Diagnosis present

## 2024-05-21 DIAGNOSIS — M19012 Primary osteoarthritis, left shoulder: Secondary | ICD-10-CM | POA: Diagnosis not present

## 2024-05-21 DIAGNOSIS — M25531 Pain in right wrist: Secondary | ICD-10-CM | POA: Diagnosis present

## 2024-05-21 DIAGNOSIS — M25512 Pain in left shoulder: Secondary | ICD-10-CM | POA: Diagnosis present

## 2024-05-21 DIAGNOSIS — M25551 Pain in right hip: Secondary | ICD-10-CM | POA: Insufficient documentation

## 2024-05-21 DIAGNOSIS — G894 Chronic pain syndrome: Secondary | ICD-10-CM | POA: Insufficient documentation

## 2024-05-21 DIAGNOSIS — Z79891 Long term (current) use of opiate analgesic: Secondary | ICD-10-CM | POA: Insufficient documentation

## 2024-05-21 DIAGNOSIS — G90511 Complex regional pain syndrome I of right upper limb: Secondary | ICD-10-CM | POA: Diagnosis present

## 2024-05-21 DIAGNOSIS — M25511 Pain in right shoulder: Secondary | ICD-10-CM | POA: Diagnosis present

## 2024-05-21 MED ORDER — HYDROCODONE-ACETAMINOPHEN 10-325 MG PO TABS: 1.0000 | ORAL_TABLET | ORAL | 0 refills | Status: DC | PRN

## 2024-05-21 MED ORDER — NALOXONE HCL 4 MG/0.1ML NA LIQD: 1.0000 | NASAL | 0 refills | Status: AC | PRN

## 2024-05-21 NOTE — Patient Instructions (Signed)

## 2024-05-21 NOTE — Progress Notes (Signed)
 Safety precautions to be maintained throughout the outpatient stay will include: orient to surroundings, keep bed in low position, maintain call bell within reach at all times, provide assistance with transfer out of bed and ambulation.   Nursing Pain Medication Assessment:  Safety precautions to be maintained throughout the outpatient stay will include: orient to surroundings, keep bed in low position, maintain call bell within reach at all times, provide assistance with transfer out of bed and ambulation.  Medication Inspection Compliance: Pill count conducted under aseptic conditions, in front of the patient. Neither the pills nor the bottle was removed from the patient's sight at any time. Once count was completed pills were immediately returned to the patient in their original bottle.  Medication: Hydrocodone /APAP Pill/Patch Count: 103 of 180 pills/patches remain Pill/Patch Appearance: Markings consistent with prescribed medication Bottle Appearance: Standard pharmacy container. Clearly labeled. Filled Date: 8 / 50 / 2025 Last Medication intake:  Today

## 2024-05-21 NOTE — Progress Notes (Signed)
 PROVIDER NOTE: Interpretation of information contained herein should be left to medically-trained personnel. Specific patient instructions are provided elsewhere under Patient Instructions section of medical record. This document was created in part using AI and STT-dictation technology, any transcriptional errors that may result from this process are unintentional.  Patient: Wendy Snyder  Service: E/M   PCP: Justus Leita DEL, MD  DOB: November 02, 1965  DOS: 05/21/2024  Provider: Emmy MARLA Blanch, NP  MRN: [id number]  Delivery: Face-to-face  Specialty: Interventional Pain Management  Type: Established Patient  Setting: Ambulatory outpatient facility  Specialty designation: 09  Referring Prov.: Justus Leita DEL, MD  Location: Outpatient office facility       History of present illness (HPI) Wendy Snyder, a 58 y.o. year old female, is here today because of her Primary osteoarthritis of both shoulders [M19.011, M19.012]. Wendy Snyder primary complain today is Shoulder Pain (Bilateral shoulders; right worse and left hip)  Pertinent problems: Wendy Snyder does not have any pertinent problems on file.  Pain Assessment: Severity of Chronic pain is reported as a 2 /10. Location: Shoulder Right, Left/denies. Onset: More than a month ago. Quality: Sharp, Aching, Constant. Timing: Intermittent. Modifying factor(s): meds, Tiger balm; lidocaine and Aspercream. Vitals:  height is 5' 7 (1.702 m) and weight is 145 lb (65.8 kg). Her temperature is 97.5 F (36.4 C) (abnormal). Her blood pressure is 121/93 (abnormal) and her pulse is 81. Her oxygen saturation is 100%.  BMI: Estimated body mass index is 22.71 kg/m as calculated from the following:   Height as of this encounter: 5' 7 (1.702 m).   Weight as of this encounter: 145 lb (65.8 kg).  Last encounter: 02/19/2024. Last procedure: Visit date not found.  Reason for encounter: evaluation for possible interventional PM therapy/treatment and  medication management.  The patient indicates doing well with current medication regimen. No side effects or adverse reaction reported to medication.  The patient's x-ray findings revealed moderate to severe glenohumeral and mild acromioclavicular osteoarthritis in the left shoulder, as well as mild glenohumeral and minimal acromioclavicular osteoarthritis in the right shoulder.  Due to her left shoulder pain being worse than the right, she expresses interest in receiving a shoulder injection in the left shoulder.  Pharmacotherapy Assessment   Hydrocodone -acetaminophen  10-325 mg every four hours as needed for severe pain. MME=60  Monitoring: [place] PMP: PDMP reviewed during this encounter.       Pharmacotherapy: No side-effects or adverse reactions reported. Compliance: No problems identified. Effectiveness: Clinically acceptable.  Wendy Nathanel PARAS, RN  05/21/2024  8:31 AM  Sign when Signing Visit Safety precautions to be maintained throughout the outpatient stay will include: orient to surroundings, keep bed in low position, maintain call bell within reach at all times, provide assistance with transfer out of bed and ambulation.   Nursing Pain Medication Assessment:  Safety precautions to be maintained throughout the outpatient stay will include: orient to surroundings, keep bed in low position, maintain call bell within reach at all times, provide assistance with transfer out of bed and ambulation.  Medication Inspection Compliance: Pill count conducted under aseptic conditions, in front of the patient. Neither the pills nor the bottle was removed from the patient's sight at any time. Once count was completed pills were immediately returned to the patient in their original bottle.  Medication: Hydrocodone /APAP Pill/Patch Count: 103 of 180 pills/patches remain Pill/Patch Appearance: Markings consistent with prescribed medication Bottle Appearance: Standard pharmacy container. Clearly labeled. Filled  Date: 8 / 33 / 2025  Last Medication intake:  Today    UDS:  Summary  Date Value Ref Range Status  02/19/2024 FINAL  Final    Comment:    ==================================================================== [employer] [facility] (MW) ==================================================================== Test                             Result       Flag       Units  Drug Present and Declared for Prescription Verification   Hydrocodone                     2028         EXPECTED   ng/mg creat   Hydromorphone                  351          EXPECTED   ng/mg creat   Dihydrocodeine                 153          EXPECTED   ng/mg creat   Norhydrocodone                 >1302        EXPECTED   ng/mg creat    Sources of hydrocodone  include scheduled prescription medications.    Hydromorphone, dihydrocodeine and norhydrocodone are expected    metabolites of hydrocodone . Hydromorphone and dihydrocodeine are    also available as scheduled prescription medications.  Drug Absent but Declared for Prescription Verification   Alprazolam                      Not Detected UNEXPECTED ng/mg creat ==================================================================== Test                      Result    Flag   Units      Ref Range   Creatinine              384              mg/dL      >=79 ==================================================================== Declared Medications:  The flagging and interpretation on this report are based on the  following declared medications.  Unexpected results may arise from  inaccuracies in the declared medications.   **Note: The testing scope of this panel includes these medications:   Alprazolam  (Xanax )  Hydrocodone  (Norco)   **Note: The testing scope of this panel does not include the  following reported medications:   Acetaminophen  (Norco)  Acyclovir  (Zovirax )  Levothyroxine  (Synthroid )  Multivitamin  Naloxone  (Narcan )  Valacyclovir  (Valtrex )  Venlafaxine   (Effexor )  Vitamin D3 ==================================================================== For clinical consultation, please call [phone]. ====================================================================     No results found for: CBDTHCR No results found for: D8THCCBX No results found for: D9THCCBX  ROS  Constitutional: Denies any fever or chills Gastrointestinal: No reported hemesis, hematochezia, vomiting, or acute GI distress Musculoskeletal: Bilateral shoulder pain (L>R), Left hip pain  Neurological: No reported episodes of acute onset apraxia, aphasia, dysarthria, agnosia, amnesia, paralysis, loss of coordination, or loss of consciousness  Medication Review  ALPRAZolam , HYDROcodone -acetaminophen , Vitamin D3, acyclovir  ointment, levothyroxine , multivitamin, naloxone , triamcinolone  cream, valACYclovir , and venlafaxine  XR  History Review  Allergy: Wendy Snyder is allergic to phenergan [promethazine hcl]. Drug: Wendy Snyder  reports no history of drug use. Alcohol:  reports no history of alcohol use. Tobacco:  reports that she has never smoked. She  has never used smokeless tobacco. Social: Wendy Snyder  reports that she has never smoked. She has never used smokeless tobacco. She reports that she does not drink alcohol and does not use drugs. Medical:  has a past medical history of Depression, Hypothyroidism, and Thyroid disease. Surgical: Wendy Snyder  has a past surgical history that includes Spinal cord stimulator implant (2003); Tubal ligation (1990); Fracture surgery (Right); and Breast cyst excision (Left). Family: family history includes Breast cancer in her maternal grandmother; Breast cancer (age of onset: 49) in her mother; Depression in her mother. She was adopted.  Laboratory Chemistry Profile   Renal Lab Results  Component Value Date   BUN 16 01/16/2024   CREATININE 0.93 01/16/2024   BCR 17 01/16/2024   GFRAA 88 12/10/2019   GFRNONAA 76 12/10/2019     Hepatic Lab Results  Component Value Date   AST 22 01/16/2024   ALT 16 01/16/2024   ALBUMIN 4.8 01/16/2024   ALKPHOS 61 01/16/2024    Electrolytes Lab Results  Component Value Date   NA 140 01/16/2024   K 5.1 01/16/2024   CL 105 01/16/2024   CALCIUM 10.0 01/16/2024   MG 2.1 12/10/2019    Bone Lab Results  Component Value Date   25OHVITD1 84 12/10/2019   25OHVITD2 <1.0 12/10/2019   25OHVITD3 84 12/10/2019    Inflammation (CRP: Acute Phase) (ESR: Chronic Phase) Lab Results  Component Value Date   CRP <1 12/10/2019   ESRSEDRATE 2 12/10/2019         Note: Above Lab results reviewed.  Recent Imaging Review  DG Shoulder Left CLINICAL DATA:  Chronic bilateral shoulder pain.  EXAM: LEFT SHOULDER - 2+ VIEW  COMPARISON:  None Available.  FINDINGS: There is diffuse decreased bone mineralization. Moderate to severe glenohumeral joint space narrowing. Mild acromioclavicular joint space narrowing and peripheral osteophytosis. No acute fracture or dislocation.  IMPRESSION: Moderate to severe glenohumeral and mild acromioclavicular osteoarthritis.  Electronically Signed   By: Tanda Lyons M.D.   On: 12/03/2023 13:57 DG Shoulder Right CLINICAL DATA:  Chronic right shoulder pain.  EXAM: RIGHT SHOULDER - 2+ VIEW  COMPARISON:  Right shoulder radiographs 12/25/2017  FINDINGS: Resolution of the lucency previously questioned as an acute fracture of the inferior glenoid. On axillary view there may be mild 2 mm cortical depression at the anterior aspect of the glenoid, presumably from the same prior remote fracture. Mild inferior glenohumeral joint space narrowing. Mild inferior, posterior, and anterior glenoid degenerative osteophytosis. Minimal acromioclavicular joint space narrowing and peripheral osteophytosis. No acute fracture is seen. No dislocation. Nonspecific catheter overlies the right hemithorax on oblique view.  IMPRESSION: 1. Mild glenohumeral and  minimal acromioclavicular osteoarthritis. 2. Interval healing of the previously questioned inferior glenoid fracture.  Electronically Signed   By: Tanda Lyons M.D.   On: 12/03/2023 13:56 Note: Reviewed        Physical Exam  Vitals: BP (!) 121/93   Pulse 81   Temp (!) 97.5 F (36.4 C)   Ht 5' 7 (1.702 m)   Wt 145 lb (65.8 kg)   LMP 10/13/2016 (Within Days)   SpO2 100%   BMI 22.71 kg/m  BMI: Estimated body mass index is 22.71 kg/m as calculated from the following:   Height as of this encounter: 5' 7 (1.702 m).   Weight as of this encounter: 145 lb (65.8 kg). Ideal: Ideal body weight: 61.6 kg (135 lb 12.9 oz) Adjusted ideal body weight: 63.3 kg (139 lb 7.7 oz) General  appearance: Well nourished, well developed, and well hydrated. In no apparent acute distress Mental status: Alert, oriented x 3 (person, place, & time)       Respiratory: No evidence of acute respiratory distress Eyes: PERLA   Assessment   Diagnosis Status  1. Primary osteoarthritis of both shoulders   2. Osteoarthritis of glenohumeral joints, bilateral   3. Osteoarthritis of both acromioclavicular joints   4. Chronic low back pain (1ry area of Pain) (Bilateral) (R>L) w/o sciatica   5. Chronic hip pain (2ry area of Pain) (Right)   6. Chronic wrist pain (3ry area of Pain) (Right) (CRPS)   7. Complex regional pain syndrome type 1 of right upper extremity   8. Chronic pain syndrome   9. Pharmacologic therapy   10. Chronic use of opiate for therapeutic purpose   11. Encounter for medication management   12. Encounter for chronic pain management   13. Chronic shoulder pain (Bilateral)    Persistent Persistent Persistent   Updated Problems: No problems updated.  Plan of Care  Problem-specific:  Assessment and Plan We will continue on current medication regimen.  Prescribing drug monitoring (PDMP) reviewed; findings consistent with the use of prescribed medication and no evidence of narcotic misuse or  abuse.  Urine drug screening (UDS) up-to-date.  No other new issues or problems reported at this visit.  Schedule follow-up in 90 days for medication management.  Plan: Schedule (Clinic) (L) Shoulder injection # 1 with Dr. Tanya    Wendy Snyder has a current medication list which includes the following long-term medication(s): levothyroxine , [START ON 06/05/2024] hydrocodone -acetaminophen , [START ON 07/05/2024] hydrocodone -acetaminophen , [START ON 08/04/2024] hydrocodone -acetaminophen , naloxone , and venlafaxine  xr.  Pharmacotherapy (Medications Ordered): Meds ordered this encounter  Medications   HYDROcodone -acetaminophen  (NORCO) 10-325 MG tablet    Sig: Take 1 tablet by mouth every 4 (four) hours as needed for severe pain (pain score 7-10). Must last 30 days    Dispense:  180 tablet    Refill:  0    DO NOT: delete (not duplicate); no partial-fill (will deny script to complete), no refill request (F/U required). DISPENSE: 1 day early if closed on fill date. WARN: No CNS-depressants within 8 hrs of med.   HYDROcodone -acetaminophen  (NORCO) 10-325 MG tablet    Sig: Take 1 tablet by mouth every 4 (four) hours as needed for severe pain (pain score 7-10). Must last 30 days    Dispense:  180 tablet    Refill:  0    DO NOT: delete (not duplicate); no partial-fill (will deny script to complete), no refill request (F/U required). DISPENSE: 1 day early if closed on fill date. WARN: No CNS-depressants within 8 hrs of med.   HYDROcodone -acetaminophen  (NORCO) 10-325 MG tablet    Sig: Take 1 tablet by mouth every 4 (four) hours as needed for severe pain (pain score 7-10). Must last 30 days    Dispense:  180 tablet    Refill:  0    DO NOT: delete (not duplicate); no partial-fill (will deny script to complete), no refill request (F/U required). DISPENSE: 1 day early if closed on fill date. WARN: No CNS-depressants within 8 hrs of med.   naloxone  (NARCAN ) nasal spray 4 mg/0.1 mL    Sig: Place 1  spray into the nose as needed for up to 365 doses (for opioid-induced respiratory depresssion). In case of emergency (overdose), spray once into each nostril. If no response within 3 minutes, repeat application and call 911.    Dispense:  1 each    Refill:  0    Instruct patient in proper use of device.   Orders:  Orders Placed This Encounter  Procedures   SHOULDER INJECTION    Standing Status:   Future    Expiration Date:   08/20/2024    Scheduling Instructions:     Procedure: Intra-articular shoulder (Glenohumeral) joint and (AC) Acromioclavicular joint injection     Side: Left-sided     Level: Glenohumeral joint and (AC) Acromioclavicular joint     Sedation: Patient's choice.     Timeframe: As permitted by the schedule    Where will this procedure be performed?:   [facility]        Return in about 3 months (around 08/20/2024) for (F2F), (MM), Emmy Blanch NP.    Recent Visits No visits were found meeting these conditions. Showing recent visits within past 90 days and meeting all other requirements Today's Visits Date Type Provider Dept  05/21/24 Office Visit Daquana Paddock K, NP [facility]  Showing today's visits and meeting all other requirements Future Appointments Date Type Provider Dept  08/19/24 Appointment Leigh Blas K, NP [facility]  Showing future appointments within next 90 days and meeting all other requirements  I discussed the assessment and treatment plan with the patient. The patient was provided an opportunity to ask questions and all were answered. The patient agreed with the plan and demonstrated an understanding of the instructions.  Patient advised to call back or seek an in-person evaluation if the symptoms or condition worsens.  Duration of encounter: 30 minutes.  Total time on encounter, as per AMA guidelines included both the face-to-face and non-face-to-face time personally spent by the physician and/or other qualified  health care professional(s) on the day of the encounter (includes time in activities that require the physician or other qualified health care professional and does not include time in activities normally performed by clinical staff). Physician's time may include the following activities when performed: Preparing to see the patient (e.g., pre-charting review of records, searching for previously ordered imaging, lab work, and nerve conduction tests) Review of prior analgesic pharmacotherapies. Reviewing PMP Interpreting ordered tests (e.g., lab work, imaging, nerve conduction tests) Performing post-procedure evaluations, including interpretation of diagnostic procedures Obtaining and/or reviewing separately obtained history Performing a medically appropriate examination and/or evaluation Counseling and educating the patient/family/caregiver Ordering medications, tests, or procedures Referring and communicating with other health care professionals (when not separately reported) Documenting clinical information in the electronic or other health record Independently interpreting results (not separately reported) and communicating results to the patient/ family/caregiver Care coordination (not separately reported)  Note by: Gilman Olazabal K Dashauna Heymann, NP (TTS and AI technology used. I apologize for any typographical errors that were not detected and corrected.) Date: 05/21/2024; Time: 9:37 AM

## 2024-05-21 NOTE — Telephone Encounter (Signed)
 Requested Prescriptions  Pending Prescriptions Disp Refills   venlafaxine  XR (EFFEXOR -XR) 75 MG 24 hr capsule [Pharmacy Med Name: VENLAFAXINE  HCL ER 75 MG CAP] 90 capsule 0    Sig: TAKE 1 CAPSULE BY MOUTH EVERY DAY     Psychiatry: Antidepressants - SNRI - desvenlafaxine & venlafaxine  Failed - 05/21/2024  8:51 AM      Failed - Last BP in normal range    BP Readings from Last 1 Encounters:  05/21/24 (!) 121/93         Failed - Lipid Panel in normal range within the last 12 months    Cholesterol, Total  Date Value Ref Range Status  01/16/2024 189 100 - 199 mg/dL Final   LDL Chol Calc (NIH)  Date Value Ref Range Status  01/16/2024 80 0 - 99 mg/dL Final   HDL  Date Value Ref Range Status  01/16/2024 98 >39 mg/dL Final   Triglycerides  Date Value Ref Range Status  01/16/2024 59 0 - 149 mg/dL Final         Passed - Cr in normal range and within 360 days    Creatinine  Date Value Ref Range Status  11/08/2013 0.93 0.60 - 1.30 mg/dL Final   Creatinine, Ser  Date Value Ref Range Status  01/16/2024 0.93 0.57 - 1.00 mg/dL Final         Passed - Completed PHQ-2 or PHQ-9 in the last 360 days      Passed - Valid encounter within last 6 months    Recent Outpatient Visits           4 months ago Annual physical exam   [facility], Leita DEL, MD

## 2024-05-25 ENCOUNTER — Other Ambulatory Visit: Payer: Self-pay | Admitting: Internal Medicine

## 2024-05-25 DIAGNOSIS — F064 Anxiety disorder due to known physiological condition: Secondary | ICD-10-CM

## 2024-05-26 ENCOUNTER — Encounter: Payer: Self-pay | Admitting: Internal Medicine

## 2024-05-27 ENCOUNTER — Other Ambulatory Visit: Payer: Self-pay | Admitting: Internal Medicine

## 2024-05-27 DIAGNOSIS — F064 Anxiety disorder due to known physiological condition: Secondary | ICD-10-CM

## 2024-05-27 MED ORDER — ALPRAZOLAM 0.5 MG PO TABS: ORAL_TABLET | ORAL | 5 refills | Status: AC

## 2024-05-27 NOTE — Telephone Encounter (Signed)
 Please review.  KP
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
## 2024-06-11 ENCOUNTER — Encounter: Payer: Self-pay | Admitting: Pain Medicine

## 2024-06-11 ENCOUNTER — Ambulatory Visit
Admission: RE | Admit: 2024-06-11 | Discharge: 2024-06-11 | Disposition: A | Discharge status: Home / Self Care | Source: Ambulatory Visit | Attending: Pain Medicine | Admitting: Pain Medicine

## 2024-06-11 ENCOUNTER — Ambulatory Visit (HOSPITAL_BASED_OUTPATIENT_CLINIC_OR_DEPARTMENT_OTHER): Admitting: Pain Medicine

## 2024-06-11 VITALS — BP 142/97 | HR 91 | Temp 97.3°F | Resp 17 | Ht 67.0 in | Wt 145.0 lb

## 2024-06-11 DIAGNOSIS — M19012 Primary osteoarthritis, left shoulder: Secondary | ICD-10-CM | POA: Insufficient documentation

## 2024-06-11 DIAGNOSIS — M25511 Pain in right shoulder: Secondary | ICD-10-CM | POA: Diagnosis present

## 2024-06-11 DIAGNOSIS — M25512 Pain in left shoulder: Secondary | ICD-10-CM | POA: Diagnosis not present

## 2024-06-11 DIAGNOSIS — G8929 Other chronic pain: Secondary | ICD-10-CM | POA: Diagnosis not present

## 2024-06-11 DIAGNOSIS — M19011 Primary osteoarthritis, right shoulder: Secondary | ICD-10-CM

## 2024-06-11 MED ORDER — ROPIVACAINE HCL 2 MG/ML IJ SOLN
9.0000 mL | Freq: Once | INTRAMUSCULAR | Status: AC
  Administered 2024-06-11: 9 mL via INTRA_ARTICULAR
  Filled 2024-06-11: qty 20

## 2024-06-11 MED ORDER — LIDOCAINE HCL 2 % IJ SOLN
20.0000 mL | Freq: Once | INTRAMUSCULAR | Status: AC
  Administered 2024-06-11: 200 mg
  Filled 2024-06-11: qty 40

## 2024-06-11 MED ORDER — PENTAFLUOROPROP-TETRAFLUOROETH EX AERO: INHALATION_SPRAY | Freq: Once | CUTANEOUS | Status: DC

## 2024-06-11 MED ORDER — METHYLPREDNISOLONE ACETATE 80 MG/ML IJ SUSP
80.0000 mg | Freq: Once | INTRAMUSCULAR | Status: AC
  Administered 2024-06-11: 80 mg via INTRA_ARTICULAR
  Filled 2024-06-11: qty 1

## 2024-06-11 NOTE — Patient Instructions (Signed)

## 2024-06-11 NOTE — Progress Notes (Signed)
 PROVIDER NOTE: Interpretation of information contained herein should be left to medically-trained personnel. Specific patient instructions are provided elsewhere under Patient Instructions section of medical record. This document was created in part using STT-dictation technology, any transcriptional errors that may result from this process are unintentional.  Patient: Wendy Snyder Type: Established DOB: 11-15-1965 MRN: [id number] PCP: Justus Leita DEL, MD  Service: Procedure DOS: 06/11/2024 Setting: Ambulatory Location: Ambulatory outpatient facility Delivery: Face-to-face Provider: Eric DELENA Como, MD Specialty: Interventional Pain Management Specialty designation: 09 Location: Outpatient facility Ref. Prov.: Justus Leita DEL, MD       Interventional Therapy   Procedure: Glenohumeral and acromioclavicular joint Injection #1  Laterality: Right (-RT)  Level: Shoulder  Target: Glenohumeral and acromioclavicular joint Location: Intra-articular  Region: Entire Shoulder Area Approach: Anterior approach. Type of procedure: Percutaneous joint injection   Imaging: Fluoroscopy-guided Non-spinal ([id number]) Anesthesia: Local anesthesia (1-2% Lidocaine ) Anxiolysis: None                 Sedation: No Sedation                       DOS: 06/11/2024  Performed by: Eric DELENA Como, MD  Purpose: Diagnostic/Therapeutic Indications: Shoulder pain severe enough to impact quality of life or function. Rationale (medical necessity): procedure needed and proper for the diagnosis and/or treatment of Wendy Snyder's medical symptoms and needs. 1. Chronic shoulder pain (Left)   2. Rotator cuff tendinitis (Left)   3. Osteoarthritis of shoulders (Bilateral)   4. Osteoarthritis of glenohumeral joints (Bilateral)   5. Osteoarthritis of acromioclavicular joints (Bilateral)   6. Chronic shoulder pain (Bilateral)    NAS-11 Pain score:   Pre-procedure: 3 /10   Post-procedure: 0-No pain/10        Position  Prep  Materials:  Position: Supine Prep solution: ChloraPrep (2% chlorhexidine gluconate and 70% isopropyl alcohol) Prep Area: Entire shoulder Area Materials:  Tray: Block Needle(s):  Type: Spinal  Gauge (G): 22  Length: 3.5-in  Qty: 1  H&P (Pre-op Assessment):  Wendy Snyder is a 58 y.o. (year old), female patient, seen today for interventional treatment. She  has a past surgical history that includes Spinal cord stimulator implant (2003); Tubal ligation (1990); Fracture surgery (Right); and Breast cyst excision (Left). Wendy Snyder has a current medication list which includes the following prescription(s): acyclovir  ointment, alprazolam , vitamin d3, hydrocodone -acetaminophen , [START ON 07/05/2024] hydrocodone -acetaminophen , [START ON 08/04/2024] hydrocodone -acetaminophen , levothyroxine , multivitamin, naloxone , triamcinolone  cream, valacyclovir , and venlafaxine  xr, and the following Facility-Administered Medications: pentafluoroprop-tetrafluoroeth. Her primarily concern today is the Shoulder Pain (right)  Initial Vital Signs:  Pulse/HCG Rate: 91ECG Heart Rate: 80 Temp: (!) 97.3 F (36.3 C) Resp: 16 BP: 136/81 SpO2: 100 %  BMI: Estimated body mass index is 22.71 kg/m as calculated from the following:   Height as of this encounter: 5' 7 (1.702 m).   Weight as of this encounter: 145 lb (65.8 kg).  Risk Assessment: Allergies: Reviewed. She is allergic to phenergan [promethazine hcl].  Allergy Precautions: None required Coagulopathies: Reviewed. None identified.  Blood-thinner therapy: None at this time Active Infection(s): Reviewed. None identified. Wendy Snyder is afebrile  Site Confirmation: Wendy Snyder was asked to confirm the procedure and laterality before marking the site Procedure checklist: Completed Consent: Before the procedure and under the influence of no sedative(s), amnesic(s), or anxiolytics, the patient was informed of the treatment options, risks  and possible complications. To fulfill our ethical and legal obligations, as recommended by the American Medical Association's Code of  Ethics, I have informed the patient of my clinical impression; the nature and purpose of the treatment or procedure; the risks, benefits, and possible complications of the intervention; the alternatives, including doing nothing; the risk(s) and benefit(s) of the alternative treatment(s) or procedure(s); and the risk(s) and benefit(s) of doing nothing. The patient was provided information about the general risks and possible complications associated with the procedure. These may include, but are not limited to: failure to achieve desired goals, infection, bleeding, organ or nerve damage, allergic reactions, paralysis, and death. In addition, the patient was informed of those risks and complications associated to the procedure, such as failure to decrease pain; infection; bleeding; organ or nerve damage with subsequent damage to sensory, motor, and/or autonomic systems, resulting in permanent pain, numbness, and/or weakness of one or several areas of the body; allergic reactions; (i.e.: anaphylactic reaction); and/or death. Furthermore, the patient was informed of those risks and complications associated with the medications. These include, but are not limited to: allergic reactions (i.e.: anaphylactic or anaphylactoid reaction(s)); adrenal axis suppression; blood sugar elevation that in diabetics may result in ketoacidosis or comma; water retention that in patients with history of congestive heart failure may result in shortness of breath, pulmonary edema, and decompensation with resultant heart failure; weight gain; swelling or edema; medication-induced neural toxicity; particulate matter embolism and blood vessel occlusion with resultant organ, and/or nervous system infarction; and/or aseptic necrosis of one or more joints. Finally, the patient was informed that Medicine is not  an exact science; therefore, there is also the possibility of unforeseen or unpredictable risks and/or possible complications that may result in a catastrophic outcome. The patient indicated having understood very clearly. We have given the patient no guarantees and we have made no promises. Enough time was given to the patient to ask questions, all of which were answered to the patient's satisfaction. Wendy Snyder has indicated that she wanted to continue with the procedure. Attestation: I, the ordering provider, attest that I have discussed with the patient the benefits, risks, side-effects, alternatives, likelihood of achieving goals, and potential problems during recovery for the procedure that I have provided informed consent. Date  Time: 06/11/2024 12:48 PM   Imaging Guidance (Non-Spinal):          Type of Imaging Technique: Fluoroscopy Guidance (Non-Spinal) Indication(s): Fluoroscopy guidance for needle placement to enhance accuracy in procedures requiring precise needle localization for targeted delivery of medication in or near specific anatomical locations not easily accessible without such real-time imaging assistance. Exposure Time: Please see nurses notes. Contrast: None used. Fluoroscopic Guidance: I was personally present during the use of fluoroscopy. Tunnel Vision Technique used to obtain the best possible view of the target area. Parallax error corrected before commencing the procedure. Direction-depth-direction technique used to introduce the needle under continuous pulsed fluoroscopy. Once target was reached, antero-posterior, oblique, and lateral fluoroscopic projection used confirm needle placement in all planes. Images permanently stored in EMR. Interpretation: No contrast injected. I personally interpreted the imaging intraoperatively. Adequate needle placement confirmed in multiple planes. Permanent images saved into the patient's record.  Pre-Procedure Preparation:   Monitoring: As per clinic protocol. Respiration, ETCO2, SpO2, BP, heart rate and rhythm monitor placed and checked for adequate function Safety Precautions: Patient was assessed for positional comfort and pressure points before starting the procedure. Time-out: I initiated and conducted the Time-out before starting the procedure, as per protocol. The patient was asked to participate by confirming the accuracy of the Time Out information. Verification of the correct  person, site, and procedure were performed and confirmed by me, the nursing staff, and the patient. Time-out conducted as per Joint Commission's Universal Protocol (UP.01.01.01). Time: 1334 Start Time: 1334 hrs.  Description  Narrative of Procedure:          Rationale (medical necessity): procedure needed and proper for the diagnosis and/or treatment of the patient's medical symptoms and needs. Procedural Technique Safety Precautions: Aspiration looking for blood return was conducted prior to all injections. At no point did we inject any substances, as a needle was being advanced. No attempts were made at seeking any paresthesias. Safe injection practices and needle disposal techniques used. Medications properly checked for expiration dates. SDV (single dose vial) medications used. Description of the Procedure: Protocol guidelines were followed. The patient was placed in position over the procedure table. The target area was identified and the area prepped in the usual manner. Skin & deeper tissues infiltrated with local anesthetic. Appropriate amount of time allowed to pass for local anesthetics to take effect. The procedure needles were then advanced to the target area. Proper needle placement secured. Negative aspiration confirmed. Solution injected in intermittent fashion, asking for systemic symptoms every 0.5cc of injectate. The needles were then removed and the area cleansed, making sure to leave some of the prepping solution back  to take advantage of its long term bactericidal properties.             Vitals:   06/11/24 1244 06/11/24 1331 06/11/24 1336 06/11/24 1339  BP: 136/81 (!) 140/83 (!) 147/96 (!) 142/97  Pulse: 91     Resp: 16 12 15 17   Temp: (!) 97.3 F (36.3 C)     SpO2: 100% 100% 100% 100%  Weight: 145 lb (65.8 kg)     Height: 5' 7 (1.702 m)        Start Time: 1334 hrs. End Time: 1339 hrs.  Antibiotic Prophylaxis:   Anti-infectives (From admission, onward)    None      Indication(s): None identified  Post-operative Assessment:  Post-procedure Vital Signs:  Pulse/HCG Rate: 9182 Temp: (!) 97.3 F (36.3 C) Resp: 17 BP: (!) 142/97 SpO2: 100 %  EBL: None  Complications: No immediate post-treatment complications observed by team, or reported by patient.  Note: The patient tolerated the entire procedure well. A repeat set of vitals were taken after the procedure and the patient was kept under observation following institutional policy, for this type of procedure. Post-procedural neurological assessment was performed, showing return to baseline, prior to discharge. The patient was provided with post-procedure discharge instructions, including a section on how to identify potential problems. Should any problems arise concerning this procedure, the patient was given instructions to immediately contact us , at any time, without hesitation. In any case, we plan to contact the patient by telephone for a follow-up status report regarding this interventional procedure.  Comments:  No additional relevant information.  Plan of Care (POC)  Orders:  Orders Placed This Encounter  Procedures   SHOULDER INJECTION    Scheduling Instructions:     Procedure: Intra-articular shoulder (Glenohumeral) joint and (AC) Acromioclavicular joint injection     Side: Right-sided     Level: Glenohumeral joint and (AC) Acromioclavicular joint     Sedation: Patient's choice.     Date: 06/11/2024    Where will  this procedure be performed?:   [facility]   [facility] C-ARM 1-60 MIN NO REPORT    Intraoperative interpretation by procedural physician at [facility].  Standing Status:   Standing    Number of Occurrences:   1    Reason for exam::   Assistance in needle guidance and placement for procedures requiring needle placement in or near specific anatomical locations not easily accessible without such assistance.   Informed Consent Details: Physician/Practitioner Attestation; Transcribe to consent form and obtain patient signature    Note: Always confirm laterality of pain with Wendy Snyder, before procedure.    Physician/Practitioner attestation of informed consent for procedure/surgical case:   I, the physician/practitioner, attest that I have discussed with the patient the benefits, risks, side effects, alternatives, likelihood of achieving goals and potential problems during recovery for the procedure that I have provided informed consent.    Procedure:   Intra-articular shoulder joint injection under fluoroscopic guidance    Physician/Practitioner performing the procedure:   Harden Bramer A. Tanya, MD    Indication/Reason:   Chronic shoulder pain secondary to shoulder arthropathy   Provide equipment / supplies at bedside    Procedure tray: Block Tray (Disposable  single use) Skin infiltration needle: Regular 1.5-in, 25-G, (x1) Block Needle type: Spinal Amount/quantity: 1 Size: Regular (3.5-inch) Gauge: 22G    Standing Status:   Standing    Number of Occurrences:   1    Specify:   Block Tray     Hydrocodone /APAP 10/325, 1 tab PO q 4 hrs (60 mg/day of hydrocodone ) MME/day: 60 mg/day.    Medications ordered for procedure: Meds ordered this encounter  Medications   lidocaine  (XYLOCAINE ) 2 % (with pres) injection 400 mg   pentafluoroprop-tetrafluoroeth (GEBAUERS) aerosol   methylPREDNISolone  acetate (DEPO-MEDROL ) injection 80 mg   ropivacaine  (PF) 2 mg/mL (0.2%)  (NAROPIN ) injection 9 mL   Medications administered: We administered lidocaine , methylPREDNISolone  acetate, and ropivacaine  (PF) 2 mg/mL (0.2%).  See the medical record for exact dosing, route, and time of administration.    Interventional Therapies  Risk  Complexity Considerations:   Estimated body mass index is 21.93 kg/m as calculated from the following:   Height as of this encounter: 5' 7 (1.702 m).   Weight as of this encounter: 140 lb (63.5 kg). WNL   Planned  Pending:      Under consideration:   Diagnostic bilateral lumbar facet block  Diagnostic IA right hip injection    Completed:   None since before 2020.   Therapeutic  Palliative (PRN) options:   Palliative right stellate ganglion block  Management of Cervical spinal cord stimulator       Follow-up plan:   Return in about 2 weeks (around 06/25/2024) for (Face2F), (PPE).     Recent Visits Date Type Provider Dept  05/21/24 Office Visit Patel, Seema K, NP [facility]  Showing recent visits within past 90 days and meeting all other requirements Today's Visits Date Type Provider Dept  06/11/24 Procedure visit Tanya Glisson, MD [facility]  Showing today's visits and meeting all other requirements Future Appointments Date Type Provider Dept  07/01/24 Appointment Tanya Glisson, MD [facility]  08/19/24 Appointment Patel, Seema K, NP [facility]  Showing future appointments within next 90 days and meeting all other requirements   Disposition: Discharge home  Discharge (Date  Time): 06/11/2024; 1350 hrs.   Primary Care Physician: Justus Leita DEL, MD Location: [facility] Note by: Glisson DELENA Tanya, MD (TTS technology used. I apologize for any typographical errors that were not detected and corrected.) Date: 06/11/2024; Time: 2:43 PM  Disclaimer:  Medicine is not an  exact science. The only guarantee in medicine is that  nothing is guaranteed. It is important to note that the decision to proceed with this intervention was based on the information collected from the patient. The Data and conclusions were drawn from the patient's questionnaire, the interview, and the physical examination. Because the information was provided in large part by the patient, it cannot be guaranteed that it has not been purposely or unconsciously manipulated. Every effort has been made to obtain as much relevant data as possible for this evaluation. It is important to note that the conclusions that lead to this procedure are derived in large part from the available data. Always take into account that the treatment will also be dependent on availability of resources and existing treatment guidelines, considered by other Pain Management Practitioners as being common knowledge and practice, at the time of the intervention. For Medico-Legal purposes, it is also important to point out that variation in procedural techniques and pharmacological choices are the acceptable norm. The indications, contraindications, technique, and results of the above procedure should only be interpreted and judged by a Board-Certified Interventional Pain Specialist with extensive familiarity and expertise in the same exact procedure and technique.

## 2024-06-11 NOTE — Progress Notes (Signed)
 Safety precautions to be maintained throughout the outpatient stay will include: orient to surroundings, keep bed in low position, maintain call bell within reach at all times, provide assistance with transfer out of bed and ambulation.

## 2024-06-12 ENCOUNTER — Telehealth: Payer: Self-pay

## 2024-06-12 NOTE — Telephone Encounter (Signed)
 Post procedure follow up.  LM

## 2024-06-30 NOTE — Progress Notes (Unsigned)
 PROVIDER NOTE: Interpretation of information contained herein should be left to medically-trained personnel. Specific patient instructions are provided elsewhere under Patient Instructions section of medical record. This document was created in part using AI and STT-dictation technology, any transcriptional errors that may result from this process are unintentional.  Patient: Wendy Snyder  Service: E/M   PCP: Justus Leita DEL, MD  DOB: Feb 03, 1966  DOS: 07/01/2024  Provider: Eric DELENA Como, MD  MRN: [id number]  Delivery: Face-to-face  Specialty: Interventional Pain Management  Type: Established Patient  Setting: Ambulatory outpatient facility  Specialty designation: 09  Referring Prov.: Justus Leita DEL, MD  Location: Outpatient office facility       History of present illness (HPI) [name], a 58 y.o. year old female, is here today because of her Chronic pain of both shoulders [M25.511, G89.29, M25.512]. [name] primary complain today is No chief complaint on file.  Pertinent problems: [name] has Chronic pain syndrome; Spinal cord stimulator in situ (Rechargable Medtronic Neurostimulator) (Cervical Epidural Leads); Chronic upper extremity pain (Right); RSD (reflex sympathetic dystrophy) (right upper extremity); Chronic low back pain (1ry area of Pain) (Bilateral) (R>L) w/o sciatica; Chronic hip pain (2ry area of Pain) (Right); Chronic wrist pain (3ry area of Pain) (Right) (CRPS); Chronic shoulder pain (Right); CRPS (complex regional pain syndrome), type I, upper (Right); Chronic knee pain (Left); Osteoarthritis of knee (Left); Rotator cuff tendinitis (Left); Chronic shoulder pain (Bilateral); Osteoarthritis of shoulders (Bilateral); Osteoarthritis of glenohumeral joints (Bilateral); Osteoarthritis of acromioclavicular joints (Bilateral); and Chronic shoulder pain (Left) on their pertinent problem list.  Pain Assessment: Severity of   is reported as a  /10.  Location:    / . Onset:  . Quality:  . Timing:  . Modifying factor(s):  SABRA Vitals:  vitals were not taken for this visit.  BMI: Estimated body mass index is 22.71 kg/m as calculated from the following:   Height as of 06/11/24: 5' 7 (1.702 m).   Weight as of 06/11/24: 145 lb (65.8 kg).  Last encounter: 03/25/2024. Last procedure: 06/11/2024.  Reason for encounter: post-procedure evaluation and assessment.   Discussed the use of AI scribe software for clinical note transcription with the patient, who gave verbal consent to proceed.  History of Present Illness          Post-Procedure Evaluation   Procedure: Glenohumeral and acromioclavicular joint Injection #1  Laterality: Right (-RT)  Level: Shoulder  Target: Glenohumeral and acromioclavicular joint Location: Intra-articular  Region: Entire Shoulder Area Approach: Anterior approach. Type of procedure: Percutaneous joint injection   Imaging: Fluoroscopy-guided Non-spinal ([id number]) Anesthesia: Local anesthesia (1-2% Lidocaine ) Anxiolysis: None                 Sedation: No Sedation                       DOS: 06/11/2024  Performed by: Eric DELENA Como, MD  Purpose: Diagnostic/Therapeutic Indications: Shoulder pain severe enough to impact quality of life or function. Rationale (medical necessity): procedure needed and proper for the diagnosis and/or treatment of Wendy Snyder medical symptoms and needs. 1. Chronic shoulder pain (Left)   2. Rotator cuff tendinitis (Left)   3. Osteoarthritis of shoulders (Bilateral)   4. Osteoarthritis of glenohumeral joints (Bilateral)   5. Osteoarthritis of acromioclavicular joints (Bilateral)   6. Chronic shoulder pain (Bilateral)    NAS-11 Pain score:   Pre-procedure: 3 /10   Post-procedure: 0-No pain/10       Effectiveness:  Initial hour after procedure:   ***. Subsequent 4-6 hours post-procedure:   ***. Analgesia past initial 6 hours:   ***. Ongoing improvement:  Analgesic:   *** Function:    ***    ROM:    ***     Pharmacotherapy Assessment   Hydrocodone /APAP 10/325, 1 tab PO q 4 hrs (60 mg/day of hydrocodone ) MME/day: 60 mg/day.   Monitoring: [place] PMP: PDMP reviewed during this encounter.       Pharmacotherapy: No side-effects or adverse reactions reported. Compliance: No problems identified. Effectiveness: Clinically acceptable.  No notes on file  UDS:  Summary  Date Value Ref Range Status  02/19/2024 FINAL  Final    Comment:    ==================================================================== [employer] Select 13 (MW) ==================================================================== Test                             Result       Flag       Units  Drug Present and Declared for Prescription Verification   Hydrocodone                     2028         EXPECTED   ng/mg creat   Hydromorphone                  351          EXPECTED   ng/mg creat   Dihydrocodeine                 153          EXPECTED   ng/mg creat   Norhydrocodone                 >1302        EXPECTED   ng/mg creat    Sources of hydrocodone  include scheduled prescription medications.    Hydromorphone, dihydrocodeine and norhydrocodone are expected    metabolites of hydrocodone . Hydromorphone and dihydrocodeine are    also available as scheduled prescription medications.  Drug Absent but Declared for Prescription Verification   Alprazolam                      Not Detected UNEXPECTED ng/mg creat ==================================================================== Test                      Result    Flag   Units      Ref Range   Creatinine              384              mg/dL      >=79 ==================================================================== Declared Medications:  The flagging and interpretation on this report are based on the  following declared medications.  Unexpected results may arise from  inaccuracies in the declared medications.   **Note: The testing scope of this  panel includes these medications:   Alprazolam  (Xanax )  Hydrocodone  (Norco)   **Note: The testing scope of this panel does not include the  following reported medications:   Acetaminophen  (Norco)  Acyclovir  (Zovirax )  Levothyroxine  (Synthroid )  Multivitamin  Naloxone  (Narcan )  Valacyclovir  (Valtrex )  Venlafaxine  (Effexor )  Vitamin D3 ==================================================================== For clinical consultation, please call [phone]. ====================================================================     No results found for: CBDTHCR No results found for: D8THCCBX No results found for: D9THCCBX  ROS  Constitutional: Denies any fever or chills Gastrointestinal: No reported hemesis, hematochezia,  vomiting, or acute GI distress Musculoskeletal: Denies any acute onset joint swelling, redness, loss of ROM, or weakness Neurological: No reported episodes of acute onset apraxia, aphasia, dysarthria, agnosia, amnesia, paralysis, loss of coordination, or loss of consciousness  Medication Review  ALPRAZolam , HYDROcodone -acetaminophen , Vitamin D3, acyclovir  ointment, levothyroxine , multivitamin, naloxone , triamcinolone  cream, valACYclovir , and venlafaxine  XR  History Review  Allergy: Wendy Snyder is allergic to phenergan [promethazine hcl]. Drug: Wendy Snyder  reports no history of drug use. Alcohol:  reports no history of alcohol use. Tobacco:  reports that she has never smoked. She has never used smokeless tobacco. Social: Wendy Snyder  reports that she has never smoked. She has never used smokeless tobacco. She reports that she does not drink alcohol and does not use drugs. Medical:  has a past medical history of Depression, Hypothyroidism, and Thyroid disease. Surgical: Wendy Snyder  has a past surgical history that includes Spinal cord stimulator implant (2003); Tubal ligation (1990); Fracture surgery (Right); and Breast cyst excision (Left). Family:  family history includes Breast cancer in her maternal grandmother; Breast cancer (age of onset: 37) in her mother; Depression in her mother. She was adopted.  Laboratory Chemistry Profile   Renal Lab Results  Component Value Date   BUN 16 01/16/2024   CREATININE 0.93 01/16/2024   BCR 17 01/16/2024   GFRAA 88 12/10/2019   GFRNONAA 76 12/10/2019    Hepatic Lab Results  Component Value Date   AST 22 01/16/2024   ALT 16 01/16/2024   ALBUMIN 4.8 01/16/2024   ALKPHOS 61 01/16/2024    Electrolytes Lab Results  Component Value Date   NA 140 01/16/2024   K 5.1 01/16/2024   CL 105 01/16/2024   CALCIUM 10.0 01/16/2024   MG 2.1 12/10/2019    Bone Lab Results  Component Value Date   25OHVITD1 84 12/10/2019   25OHVITD2 <1.0 12/10/2019   25OHVITD3 84 12/10/2019    Inflammation (CRP: Acute Phase) (ESR: Chronic Phase) Lab Results  Component Value Date   CRP <1 12/10/2019   ESRSEDRATE 2 12/10/2019         Note: Above Lab results reviewed.  Recent Imaging Review  DG PAIN CLINIC C-ARM 1-60 MIN NO REPORT Fluoro was used, but no Radiologist interpretation will be provided.  Please refer to NOTES tab for provider progress note. Note: Reviewed        Physical Exam  Vitals: LMP 10/13/2016 (Within Days)  BMI: Estimated body mass index is 22.71 kg/m as calculated from the following:   Height as of 06/11/24: 5' 7 (1.702 m).   Weight as of 06/11/24: 145 lb (65.8 kg). Ideal: Patient weight not recorded General appearance: Well nourished, well developed, and well hydrated. In no apparent acute distress Mental status: Alert, oriented x 3 (person, place, & time)       Respiratory: No evidence of acute respiratory distress Eyes: PERLA   Assessment   Diagnosis Status  1. Chronic shoulder pain (Bilateral)   2. Osteoarthritis of shoulders (Bilateral)   3. Osteoarthritis of glenohumeral joints (Bilateral)   4. Osteoarthritis of acromioclavicular joints (Bilateral)   5. Complex  regional pain syndrome type 1 of right upper extremity   6. Postop check    Controlled Controlled Controlled   Updated Problems: No problems updated.  Plan of Care  Problem-specific:  Assessment and Plan            Wendy Snyder has a current medication list which includes the following long-term medication(s): hydrocodone -acetaminophen , [START ON  07/05/2024] hydrocodone -acetaminophen , [START ON 08/04/2024] hydrocodone -acetaminophen , levothyroxine , naloxone , and venlafaxine  xr.  Pharmacotherapy (Medications Ordered): No orders of the defined types were placed in this encounter.  Orders:  No orders of the defined types were placed in this encounter.    Interventional Therapies  Risk  Complexity Considerations:   Estimated body mass index is 21.93 kg/m as calculated from the following:   Height as of this encounter: 5' 7 (1.702 m).   Weight as of this encounter: 140 lb (63.5 kg). WNL   Planned  Pending:      Under consideration:   Diagnostic bilateral lumbar facet block  Diagnostic IA right hip injection    Completed:   None since before 2020.   Therapeutic  Palliative (PRN) options:   Palliative right stellate ganglion block  Management of Cervical spinal cord stimulator      No follow-ups on file.    Recent Visits Date Type Provider Dept  06/11/24 Procedure visit Tanya Glisson, MD [facility]  05/21/24 Office Visit Patel, Seema K, NP [facility]  Showing recent visits within past 90 days and meeting all other requirements Future Appointments Date Type Provider Dept  07/01/24 Appointment Tanya Glisson, MD [facility]  08/19/24 Appointment Patel, Seema K, NP [facility]  Showing future appointments within next 90 days and meeting all other requirements  I discussed the assessment and treatment plan with the patient. The patient was provided an opportunity to ask questions and all were answered.  The patient agreed with the plan and demonstrated an understanding of the instructions.  Patient advised to call back or seek an in-person evaluation if the symptoms or condition worsens.  Duration of encounter: *** minutes.  Total time on encounter, as per AMA guidelines included both the face-to-face and non-face-to-face time personally spent by the physician and/or other qualified health care professional(s) on the day of the encounter (includes time in activities that require the physician or other qualified health care professional and does not include time in activities normally performed by clinical staff). Physician's time may include the following activities when performed: Preparing to see the patient (e.g., pre-charting review of records, searching for previously ordered imaging, lab work, and nerve conduction tests) Review of prior analgesic pharmacotherapies. Reviewing PMP Interpreting ordered tests (e.g., lab work, imaging, nerve conduction tests) Performing post-procedure evaluations, including interpretation of diagnostic procedures Obtaining and/or reviewing separately obtained history Performing a medically appropriate examination and/or evaluation Counseling and educating the patient/family/caregiver Ordering medications, tests, or procedures Referring and communicating with other health care professionals (when not separately reported) Documenting clinical information in the electronic or other health record Independently interpreting results (not separately reported) and communicating results to the patient/ family/caregiver Care coordination (not separately reported)  Note by: Glisson DELENA Tanya, MD (TTS and AI technology used. I apologize for any typographical errors that were not detected and corrected.) Date: 07/01/2024; Time: 7:12 AM

## 2024-07-01 ENCOUNTER — Ambulatory Visit: Admitting: Pain Medicine

## 2024-07-01 DIAGNOSIS — M19011 Primary osteoarthritis, right shoulder: Secondary | ICD-10-CM

## 2024-07-01 DIAGNOSIS — Z09 Encounter for follow-up examination after completed treatment for conditions other than malignant neoplasm: Secondary | ICD-10-CM

## 2024-07-01 DIAGNOSIS — G8929 Other chronic pain: Secondary | ICD-10-CM

## 2024-07-01 DIAGNOSIS — G90511 Complex regional pain syndrome I of right upper limb: Secondary | ICD-10-CM

## 2024-07-01 DIAGNOSIS — Z91199 Patient's noncompliance with other medical treatment and regimen due to unspecified reason: Secondary | ICD-10-CM

## 2024-07-16 ENCOUNTER — Other Ambulatory Visit: Payer: Self-pay | Admitting: Medical Genetics

## 2024-07-16 DIAGNOSIS — Z006 Encounter for examination for normal comparison and control in clinical research program: Secondary | ICD-10-CM

## 2024-07-18 ENCOUNTER — Telehealth: Admitting: Emergency Medicine

## 2024-07-18 DIAGNOSIS — R21 Rash and other nonspecific skin eruption: Secondary | ICD-10-CM

## 2024-07-18 NOTE — Progress Notes (Signed)
 I am very limited in what I can treat by Evisit. Because this is an ongoing problem, I feel your condition warrants further evaluation and I recommend that you be seen for a face to face visit.  Please contact your primary care physician practice to be seen. You may need to see a dermatologist.    NOTE: You will NOT be charged for this eVisit.  If you do not have a PCP, [facility] offers a free physician referral service available at [phone]. Our trained staff has the experience, knowledge and resources to put you in touch with a physician who is right for you.    If you are having a true medical emergency please call 911.   Your e-visit answers were reviewed by a board certified advanced clinical practitioner to complete your personal care plan.  Thank you for using e-Visits.

## 2024-07-23 ENCOUNTER — Encounter: Payer: Self-pay | Admitting: Internal Medicine

## 2024-07-23 ENCOUNTER — Ambulatory Visit (INDEPENDENT_AMBULATORY_CARE_PROVIDER_SITE_OTHER): Admitting: Internal Medicine

## 2024-07-23 VITALS — BP 118/72 | HR 91 | Ht 67.0 in | Wt 153.0 lb

## 2024-07-23 DIAGNOSIS — L03116 Cellulitis of left lower limb: Secondary | ICD-10-CM

## 2024-07-23 DIAGNOSIS — L308 Other specified dermatitis: Secondary | ICD-10-CM | POA: Diagnosis not present

## 2024-07-23 MED ORDER — DOXYCYCLINE HYCLATE 100 MG PO TABS: 100.0000 mg | ORAL_TABLET | Freq: Two times a day (BID) | ORAL | 0 refills | Status: AC

## 2024-07-23 NOTE — Patient Instructions (Signed)
 Take Allegra or Claritin daily to reduce itching.
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
## 2024-08-12 LAB — GENECONNECT MOLECULAR SCREEN: Genetic Analysis Overall Interpretation: NEGATIVE

## 2024-08-16 ENCOUNTER — Other Ambulatory Visit: Payer: Self-pay | Admitting: Internal Medicine

## 2024-08-16 DIAGNOSIS — F064 Anxiety disorder due to known physiological condition: Secondary | ICD-10-CM

## 2024-08-19 ENCOUNTER — Ambulatory Visit (HOSPITAL_BASED_OUTPATIENT_CLINIC_OR_DEPARTMENT_OTHER): Payer: PRIVATE HEALTH INSURANCE | Admitting: Nurse Practitioner

## 2024-08-19 DIAGNOSIS — Z91199 Patient's noncompliance with other medical treatment and regimen due to unspecified reason: Secondary | ICD-10-CM

## 2024-08-19 DIAGNOSIS — Z79899 Other long term (current) drug therapy: Secondary | ICD-10-CM

## 2024-08-19 DIAGNOSIS — G8929 Other chronic pain: Secondary | ICD-10-CM

## 2024-08-19 DIAGNOSIS — M545 Low back pain, unspecified: Secondary | ICD-10-CM

## 2024-08-19 DIAGNOSIS — G90511 Complex regional pain syndrome I of right upper limb: Secondary | ICD-10-CM

## 2024-08-19 DIAGNOSIS — G894 Chronic pain syndrome: Secondary | ICD-10-CM

## 2024-08-19 DIAGNOSIS — Z79891 Long term (current) use of opiate analgesic: Secondary | ICD-10-CM

## 2024-08-19 NOTE — Progress Notes (Signed)
 08/19/2024 - No show

## 2024-08-20 ENCOUNTER — Encounter: Payer: Self-pay | Admitting: Internal Medicine

## 2024-08-20 ENCOUNTER — Other Ambulatory Visit: Payer: Self-pay | Admitting: Internal Medicine

## 2024-08-20 DIAGNOSIS — R21 Rash and other nonspecific skin eruption: Secondary | ICD-10-CM

## 2024-08-20 DIAGNOSIS — F064 Anxiety disorder due to known physiological condition: Secondary | ICD-10-CM

## 2024-08-20 MED ORDER — TRIAMCINOLONE ACETONIDE 0.5 % EX CREA: TOPICAL_CREAM | Freq: Three times a day (TID) | CUTANEOUS | 0 refills | Status: AC

## 2024-08-20 MED ORDER — VENLAFAXINE HCL ER 75 MG PO CP24: 75.0000 mg | ORAL_CAPSULE | Freq: Every day | ORAL | 0 refills | Status: AC

## 2024-08-20 NOTE — Telephone Encounter (Signed)
 Please review.  KP
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
## 2024-08-23 NOTE — Progress Notes (Signed)
 08/26/2024- NO SHOW

## 2024-08-26 ENCOUNTER — Ambulatory Visit: Admitting: Nurse Practitioner

## 2024-08-26 DIAGNOSIS — Z91199 Patient's noncompliance with other medical treatment and regimen due to unspecified reason: Secondary | ICD-10-CM

## 2024-08-26 DIAGNOSIS — F1190 Opioid use, unspecified, uncomplicated: Secondary | ICD-10-CM

## 2024-08-26 DIAGNOSIS — G894 Chronic pain syndrome: Secondary | ICD-10-CM

## 2024-08-26 DIAGNOSIS — G90511 Complex regional pain syndrome I of right upper limb: Secondary | ICD-10-CM

## 2024-08-26 DIAGNOSIS — Z79891 Long term (current) use of opiate analgesic: Secondary | ICD-10-CM

## 2024-08-26 DIAGNOSIS — G8929 Other chronic pain: Secondary | ICD-10-CM

## 2024-08-26 DIAGNOSIS — Z79899 Other long term (current) drug therapy: Secondary | ICD-10-CM

## 2024-08-26 DIAGNOSIS — Z9689 Presence of other specified functional implants: Secondary | ICD-10-CM

## 2024-09-02 ENCOUNTER — Encounter: Payer: Self-pay | Admitting: Nurse Practitioner

## 2024-09-02 ENCOUNTER — Ambulatory Visit: Attending: Nurse Practitioner | Admitting: Nurse Practitioner

## 2024-09-02 VITALS — BP 141/92 | HR 65 | Temp 97.2°F | Resp 16 | Ht 67.0 in | Wt 150.0 lb

## 2024-09-02 DIAGNOSIS — Z79899 Other long term (current) drug therapy: Secondary | ICD-10-CM | POA: Insufficient documentation

## 2024-09-02 DIAGNOSIS — M25551 Pain in right hip: Secondary | ICD-10-CM | POA: Diagnosis present

## 2024-09-02 DIAGNOSIS — M25511 Pain in right shoulder: Secondary | ICD-10-CM | POA: Diagnosis present

## 2024-09-02 DIAGNOSIS — G90511 Complex regional pain syndrome I of right upper limb: Secondary | ICD-10-CM

## 2024-09-02 DIAGNOSIS — G894 Chronic pain syndrome: Secondary | ICD-10-CM

## 2024-09-02 DIAGNOSIS — Z79891 Long term (current) use of opiate analgesic: Secondary | ICD-10-CM | POA: Insufficient documentation

## 2024-09-02 DIAGNOSIS — M545 Low back pain, unspecified: Secondary | ICD-10-CM | POA: Insufficient documentation

## 2024-09-02 DIAGNOSIS — M25531 Pain in right wrist: Secondary | ICD-10-CM | POA: Diagnosis present

## 2024-09-02 DIAGNOSIS — G8929 Other chronic pain: Secondary | ICD-10-CM | POA: Insufficient documentation

## 2024-09-02 MED ORDER — HYDROCODONE-ACETAMINOPHEN 10-325 MG PO TABS: 1.0000 | ORAL_TABLET | ORAL | 0 refills | Status: AC | PRN

## 2024-09-02 NOTE — Patient Instructions (Signed)
 ______________________________________________________________________    Opioid Pain Medication Update  To: All patients taking opioid pain medications. (I.e.: hydrocodone , hydromorphone , oxycodone , oxymorphone, morphine , codeine, methadone, tapentadol, tramadol , buprenorphine, fentanyl , etc.)  Re: Updated review of side effects and adverse reactions of opioid analgesics, as well as new information about long term effects of this class of medications.  Direct risks of long-term opioid therapy are not limited to opioid addiction and overdose. Potential medical risks include serious fractures, breathing problems during sleep, hyperalgesia, immunosuppression, chronic constipation, bowel obstruction, myocardial infarction, and tooth decay secondary to xerostomia.  Unpredictable adverse effects that can occur even if you take your medication correctly: Cognitive impairment, respiratory depression, and death. Most people think that if they take their medication correctly, and as instructed, that they will be safe. Nothing could be farther from the truth. In reality, a significant amount of recorded deaths associated with the use of opioids has occurred in individuals that had taken the medication for a long time, and were taking their medication correctly. The following are examples of how this can happen: Patient taking his/her medication for a long time, as instructed, without any side effects, is given a certain antibiotic or another unrelated medication, which in turn triggers a Drug-to-drug interaction leading to disorientation, cognitive impairment, impaired reflexes, respiratory depression or an untoward event leading to serious bodily harm or injury, including death.  Patient taking his/her medication for a long time, as instructed, without any side effects, develops an acute impairment of liver and/or kidney function. This will lead to a rapid inability of the body to breakdown and eliminate  their pain medication, which will result in effects similar to an overdose, but with the same medicine and dose that they had always taken. This again may lead to disorientation, cognitive impairment, impaired reflexes, respiratory depression or an untoward event leading to serious bodily harm or injury, including death.  A similar problem will occur with patients as they grow older and their liver and kidney function begins to decrease as part of the aging process.  Background information: Historically, the original case for using long-term opioid therapy to treat chronic noncancer pain was based on safety assumptions that subsequent experience has called into question. In 1996, the American Pain Society and the American Academy of Pain Medicine issued a consensus statement supporting long-term opioid therapy. This statement acknowledged the dangers of opioid prescribing but concluded that the risk for addiction was low; respiratory depression induced by opioids was short-lived, occurred mainly in opioid-naive patients, and was antagonized by pain; tolerance was not a common problem; and efforts to control diversion should not constrain opioid prescribing. This has now proven to be wrong. Experience regarding the risks for opioid addiction, misuse, and overdose in community practice has failed to support these assumptions.  According to the Centers for Disease Control and Prevention, fatal overdoses involving opioid analgesics have increased sharply over the past decade. Currently, more than 96,700 people die from drug overdoses every year. Opioids are a factor in 7 out of every 10 overdose deaths. Deaths from drug overdose have surpassed motor vehicle accidents as the leading cause of death for individuals between the ages of 82 and 49.  Clinical data suggest that neuroendocrine dysfunction may be very common in both men and women, potentially causing hypogonadism, erectile dysfunction, infertility,  decreased libido, osteoporosis, and depression. Recent studies linked higher opioid dose to increased opioid-related mortality. Controlled observational studies reported that long-term opioid therapy may be associated with increased risk for cardiovascular events. Subsequent  meta-analysis concluded that the safety of long-term opioid therapy in elderly patients has not been proven.   Side Effects and adverse reactions: Common side effects: Drowsiness (sedation). Dizziness. Nausea and vomiting. Constipation. Physical dependence -- Dependence often manifests with withdrawal symptoms when opioids are discontinued or decreased. Tolerance -- As you take repeated doses of opioids, you require increased medication to experience the same effect of pain relief. Respiratory depression -- This can occur in healthy people, especially with higher doses. However, people with COPD, asthma or other lung conditions may be even more susceptible to fatal respiratory impairment.  Uncommon side effects: An increased sensitivity to feeling pain and extreme response to pain (hyperalgesia). Chronic use of opioids can lead to this. Delayed gastric emptying (the process by which the contents of your stomach are moved into your small intestine). Muscle rigidity. Immune system and hormonal dysfunction. Quick, involuntary muscle jerks (myoclonus). Arrhythmia. Itchy skin (pruritus). Dry mouth (xerostomia).  Long-term side effects: Chronic constipation. Sleep-disordered breathing (SDB). Increased risk of bone fractures. Hypothalamic-pituitary-adrenal dysregulation. Increased risk of overdose.  RISKS: Respiratory depression and death: Opioids increase the risk of respiratory depression and death.  Drug-to-drug interactions: Opioids are relatively contraindicated in combination with benzodiazepines, sleep inducers, and other central nervous system depressants. Other classes of medications (i.e.: certain antibiotics  and even over-the-counter medications) may also trigger or induce respiratory depression in some patients.  Medical conditions: Patients with pre-existing respiratory problems are at higher risk of respiratory failure and/or depression when in combination with opioid analgesics. Opioids are relatively contraindicated in some medical conditions such as central sleep apnea.   Fractures and Falls:  Opioids increase the risk and incidence of falls. This is of particular importance in elderly patients.  Endocrine System:  Long-term administration is associated with endocrine abnormalities (endocrinopathies). (Also known as Opioid-induced Endocrinopathy) Influences on both the hypothalamic-pituitary-adrenal axis?and the hypothalamic-pituitary-gonadal axis have been demonstrated with consequent hypogonadism and adrenal insufficiency in both sexes. Hypogonadism and decreased levels of dehydroepiandrosterone sulfate have been reported in men and women. Endocrine effects include: Amenorrhoea in women (abnormal absence of menstruation) Reduced libido in both sexes Decreased sexual function Erectile dysfunction in men Hypogonadisms (decreased testicular function with shrinkage of testicles) Infertility Depression and fatigue Loss of muscle mass Anxiety Depression Immune suppression Hyperalgesia Weight gain Anemia Osteoporosis Patients (particularly women of childbearing age) should avoid opioids. There is insufficient evidence to recommend routine monitoring of asymptomatic patients taking opioids in the long-term for hormonal deficiencies.  Immune System: Human studies have demonstrated that opioids have an immunomodulating effect. These effects are mediated via opioid receptors both on immune effector cells and in the central nervous system. Opioids have been demonstrated to have adverse effects on antimicrobial response and anti-tumour surveillance. Buprenorphine has been demonstrated to have  no impact on immune function.  Opioid Induced Hyperalgesia: Human studies have demonstrated that prolonged use of opioids can lead to a state of abnormal pain sensitivity, sometimes called opioid induced hyperalgesia (OIH). Opioid induced hyperalgesia is not usually seen in the absence of tolerance to opioid analgesia. Clinically, hyperalgesia may be diagnosed if the patient on long-term opioid therapy presents with increased pain. This might be qualitatively and anatomically distinct from pain related to disease progression or to breakthrough pain resulting from development of opioid tolerance. Pain associated with hyperalgesia tends to be more diffuse than the pre-existing pain and less defined in quality. Management of opioid induced hyperalgesia requires opioid dose reduction.  Cancer: Chronic opioid therapy has been associated with an increased risk  of cancer among noncancer patients with chronic pain. This association was more evident in chronic strong opioid users. Chronic opioid consumption causes significant pathological changes in the small intestine and colon. Epidemiological studies have found that there is a link between opium  dependence and initiation of gastrointestinal cancers. Cancer is the second leading cause of death after cardiovascular disease. Chronic use of opioids can cause multiple conditions such as GERD, immunosuppression and renal damage as well as carcinogenic effects, which are associated with the incidence of cancers.   Mortality: Long-term opioid use has been associated with increased mortality among patients with chronic non-cancer pain (CNCP).  Prescription of long-acting opioids for chronic noncancer pain was associated with a significantly increased risk of all-cause mortality, including deaths from causes other than overdose.  Reference: Von Korff M, Kolodny A, Deyo RA, Chou R. Long-term opioid therapy reconsidered. Ann Intern Med. 2011 Sep 6;155(5):325-8. doi:  10.7326/[id number]. PMID: 78106373; PMCID: [id number]. Kit JINNY Laurence CINDERELLA Pearley JINNY, Hayward RA, Dunn KM, Jordan KP. Risk of adverse events in patients prescribed long-term opioids: A cohort study in the [place] Clinical Practice Research [employer]. Eur J Pain. 2019 May;23(5):908-922. doi: 10.1002/ejp.1357. Epub 2019 Jan 31. PMID: 69379883. Colameco S, Coren JS, Ciervo CA. Continuous opioid treatment for chronic noncancer pain: a time for moderation in prescribing. Postgrad Med. 2009 Jul;121(4):61-6. doi: 10.3810/pgm.2009.07.2032. PMID: 80358728. Gigi JONELLE Shlomo MILUS Levern IVER Conny RN, [place] SD, Blazina I, Lonell DASEN, Bougatsos C, Deyo RA. The effectiveness and risks of long-term opioid therapy for chronic pain: a systematic review for a [employer] of [employer] Pathways to [employer]. Ann Intern Med. 2015 Feb 17;162(4):276-86. doi: 10.7326/M14-2559. PMID: 74418742. Rory CHRISTELLA Laurence [facility], [place]uc DM. NCHS Data Brief No. 22. [place]: Centers for Disease Control and Prevention; 2009. Sep, Increase in Fatal Poisonings Involving Opioid Analgesics in the [place] , 1999-2006. Song IA, Choi HR, Oh TK. Long-term opioid use and mortality in patients with chronic non-cancer pain: Ten-year follow-up study in [place] from 2010 through 2019. EClinicalMedicine. 2022 Jul 18;51:101558. doi: 10.1016/j.eclinm.[id number]. PMID: 64124182; PMCID: [id number]. Huser, W., Schubert, T., Vogelmann, T. et al. All-cause mortality in patients with long-term opioid therapy compared with non-opioid analgesics for chronic non-cancer pain: a database study. BMC Med 18, 162 (2020). [email or web address].info/ Rashidian H, Zendehdel K, Kamangar F, Malekzadeh R, Haghdoost AA. An Ecological Study of the Association between Opiate Use and Incidence of Cancers. Addict Health. 2016 Fall;8(4):252-260. PMID: 71180443; PMCID: [id number].  Our Goal: Our goal is to control your pain with means other  than the use of opioid pain medications.  Our Recommendation: Talk to your physician about coming off of these medications. We can assist you with the tapering down and stopping these medicines. Based on the new information, even if you cannot completely stop the medication, a decrease in the dose may be associated with a lesser risk. Ask for other means of controlling the pain. Decrease or eliminate those factors that significantly contribute to your pain such as smoking, obesity, and a diet heavily tilted towards inflammatory nutrients.  Last Updated: 03/27/2023   ______________________________________________________________________       ______________________________________________________________________    Update on Controlled Substance (Opioid) Regulations   To: All patients taking opioid pain medications. (I.e.: hydrocodone , hydromorphone , oxycodone , oxymorphone, morphine , codeine, methadone, tapentadol, tramadol , buprenorphine, fentanyl , etc.)  Re: Review on the state of controlled substance regulations.  Introduction: Rules and regulations associated with all aspects of controlled substances are constantly being modified. Unfortunately we have encountered patients questioning the veracity of the information  that we provide them about these changes. This is intended to provide them with appropriate references and a historical review of these changes.  A Brief History: As of July 04, 2016, the [place]  Government declared the opioid epidemic a public health emergency. Prescription drug monitoring programs (PDMPs) and the [facility] All Schedules Prescription Electronic Reporting Act (NASPER). Before 1800, clinicians regarded pain as an existential phenomenon, a consequence of aging. There was no regulation on the use of cocaine and opioids, resulting in widespread marketing and prescribing for many ailments ranging from diarrhea to toothache. The [employer] of  [phone], passed in response to the sudden emergence of street heroin abuse as well as iatrogenic morphine  dependence, influenced both physician and patient alike to avoid opiates. Patients with unexplained pain in the 1920s were regarded as deluded, malingering, or abusers, and cancer patients through the 1950s were encouraged to wean themselves off opioids until their lives could be measured in weeks. Alongside this opioid evolution, the American Pain Society launched their influential pain as the fifth vital sign campaign in 1995. Concurrently, pharmaceutical companies introduced new formulations, such as extended release oxycodone  (OxyContin ). From 1997 to 2002, OxyContin  prescriptions increased from 670,000 to 6.2 million. However, concerns soon began to surface regarding overzealous opioid treatment. It must be noted that pharmaceutical companies contributed significantly to the rise of the opioid epidemic, receiving considerable reprimands as a consequence. In 2007, as the opioid epidemic began to inflict profound damage, [employer] pleaded guilty to federal charges related to the misbranding of OxyContin . [employer] agreed to pay a total of $634.5 million to resolve Justice Department investigations, as well as a $19.5 million settlement to [address] and the [address] of [place].  In response to the current epidemic, changes in focus to the development of new abuse deterrent opioid formulations at the [place]  Food and Drug Administration (FDA) as well as drafting of new public standards for pain treatment were created at [facility] in 2017. In response to the opioid epidemic, FDA public policy changes were announced in February 2016. Among these new positions were a re-examination of the risk-benefit paradigm for opioids with strict emphasis on the large public health ramifications. The various modified opioids released over the past 20 years, such as tamper-resistant preparation, have had differing levels of success,  and are collectively referred to as Risk Evaluation and Mitigation Strategies (REMS). There is also a growing focus on preventing opioid use disorder (OUD) and on offering affected individuals accessible and effective treatment. [place]  government policy reflects these changes and both the Affordable Care Act and the Mental Health Parity and Addiction Equity Act were major steps forward in treating opioid addiction. The Affordable Care Act, which was signed into law in 2010, with major provisions coming into effect by 2014.  In the 1990s, the intensified marketing of newly reformulated prescription opioid medications (e.g., OxyContin ) and an influential pain advocacy campaign that encouraged greater pain management led to a precipitous rise in opioid use in the [place] . Research from the Centers for Disease Control and Prevention (CDC) shows that prescription opioid sales in the [place]  quadrupled from 1999 to 2010. At the same time, opioid misuse and opioid-involved overdose deaths increased (Figure 1). Between 1999 and 2010, the rate of opioidinvolved overdose deaths in the [place]  doubled from 2.9 to 6.8 deaths per 100,000 people. This initial rise in opioid-related deaths is often referred to as the first wave of the recent opioid crisis.  Between 1999 and 2020, 565,000  Americans died of opioid-involved overdoses. In turn, federal, state, and local governments responded with various legal and policy efforts to curb opioid misuse and drug-related overdose Deaths.  Recent Congresses have enacted several laws addressing the opioid crisis, such as the Comprehensive Addiction and Recovery Act of 2016 (CARA, P.L. 114-198); the 21st Century Cures Act (P.L. 114-255); the Substance UseDisorder Prevention that Promotes Opioid Recovery and Treatment for Patients and Communities Act (SUPPORT Act, P.L. [phone]); the Fentanyl  Sanctions Act (Title LXXII of P.L. [id number]); and the Blocking  Deadly Fentanyl  Imports Act (P.L. 117-81, 6610). These laws addressed overprescribing and misuse of opioids, expanded substance use disorder prevention and treatment capacities, bolstered drug diversion capabilities, and enhanced international drug interdiction, counternarcotics cooperation, and sanctions efforts. Congress also directed additional funds to many of these initiatives through appropriations.  Congress provided funding in the [employer] Act of 2021 [phone]; P.L. 117-2) for syringe services programs (often known as needle exchange programs) and other harm reduction initiatives. Federal and state harm reduction strategies have frequently involved the distribution of naloxone (e.g., Narcan)--a medication used to reverse an opioid overdose--and test strips used to detect fentanyl  in drug samples.  The Department of Justice (DOJ) and Department of Homeland Security [facility]) aim to reduce the diversion of prescription opioids and the use, manufacturing, and trafficking of illicit opioids. DOJ--via the Drug Enforcement Administration (DEA)--regulates opioid manufacturers, distributors, and dispensers; it also controls the opioid supply through enforcement of regulatory requirements.  A History of Opiate Laws in the [place]   Prior to 1890, laws concerning opiates were strictly imposed on a local city or state-by-state basis. One of the first was in [place] in 1875 where it became illegal to smoke opium  only in opium  dens. It did not ban the sale, import or use otherwise. In the next 25 years different states enacted opium  laws ranging from outlawing opium  dens altogether to making possession of opium , morphine  and heroin without a physicians prescription illegal.  The first Congressional Act took place in 1890 that levied taxes on morphine  and opium . From that time on the [employer] has had a series of laws and acts directly aimed at opiate use, abuse  and control. These are outlined below:  1906 - Pure Food and Drug Act Preventing the manufacture, sale, or transportation of adulterated or misbranded or poisonous or deleterious foods, drugs, medicines, and liquors, and for regulating traffic therein, and for other purposes. Punishment included fines and prison time.  1909   - Smoking Opium  Exclusion Act Banned the importation, possession and use of smoking opium . Did not regulate opium -based medications. First Freight forwarder banning the non-medical use of a substance.  1914  - The Margrette Act In summary, The Margrette Act of 1914 was written more to have all parties involved in importing, exporting, set designer and distributing opium  or cocaine to register with the [employer] and have taxes levied upon them. Exempt from the law were physicians operating in the course of his professional practice  1919 - Supreme Court ratified the Bj's in Buckhorn et al., v. [place]  and [place]  v. [place], then again in [facility] v. [place] , in 1920, holding that doctors may not prescribe maintenance supplies of narcotics to people addicted to narcotics. However, it does not prohibit doctors from prescribing narcotics to wean a patient off of the drug. It was also the opinion of the court that prescribing narcotics to habitual users was not considered professional practice hence it  then was considered illegal for doctors to prescribe opioids for the purposes of maintaining an addiction. It can be argued that todays addiction medications are not intended to maintain an addiction but to facilitate addiction remission. In which case, this opinion of the court should not preclude practitioners from prescribing buprenorphine or methadone to patients suffering from an addictive disorder.  1924  - Heroin Act Architectural technologist, importation and possession of heroin illegal - even for medicinal use.  1922 -- Narcotic  Drug Import and Export Act Enacted to assure proper control of importation, sale, possession, production and consumption of narcotics.  1927  -- Special Educational Needs Teacher of [employer] of Prohibition was responsible for tracking bootleggers and organized conservation officer, historic buildings. They focused primarily on interstate and international cases and those cases where local law enforcement official would not or could not act.  1932 -- Uniform State Narcotic Act Encouraged states to pass uniform state laws matching the federal Narcotic Drug Import and Export Act. Suggested prohibiting cannabis use at the state level.  41 -- Food, Drug, and Cosmetic Act The new law brought cosmetics and medical devices under control, and it required that drugs be labeled with adequate directions for safe use. Moreover, it mandated pre-market approval of all new drugs, such that a manufacturer would have to prove to FDA that a drug were safe before it could be sold  1951 -- Boggs Act Imposed maximum criminal penalties for violations of the import/export and internal revenue laws related to drugs and also established mandatory minimum prison sentences.  1956 -- Narcotics Control Act Increased Boggs Act penalties and mandatory prison sentence minimums for violations of existing drug laws.  1965 -- Drug Abuse Control Amendment Enacted to deal with problems caused by abuse of depressants, stimulants and hallucinogens. Restricted research into psychoactive drugs such as LSD by requiring FDA approval.  1970 -- Controlled Substance Act  Controlled Substances Import and Export Act These laws are a consolidation of numerous laws regulating the manufacture and distribution of narcotics, stimulants, depressants, hallucinogens, anabolic steroids, and chemicals used in the illicit production of controlled substances. The CSA places all substances that are regulated under existing federal law into one of five schedules. This placement is based upon  the substance's medicinal value, harmfulness, and potential for abuse or addiction. Schedule I is reserved for the most dangerous drugs that have no recognized medical use, while Schedule V is the classification used for the least dangerous drugs. The act also provides a mechanism for substances to be controlled, added to a schedule, decontrolled, removed from control, rescheduled, or transferred from one schedule to another.  36 - Drug Enforcement Agency By Executive Order, the DEA was formed to take place of the [employer] of Narcotics and Dangerous Drugs.  37 -Narcotic Addict Treatment Act of  1974  - Public Law [phone] Amends the Controlled Substance Act of 1970 to provide for the registration of practitioners conducting narcotic treatment programs. [methadone clinics] It also provides legal definitions for the phrases maintenance treatment and detoxification treatment.  1986 -- Anti-Drug Abuse Act of 1986 Strengthened Federal efforts to encourage foreign cooperation in eradicating illicit drug crops and in halting international drug traffic, to improve enforcement of Federal drug laws and enhance interdiction of illicit drug shipments, to provide strong Market researcher in establishing effective drug abuse prevention and education programs, to [employer] support for drug abuse treatment and rehabilitation efforts, and for other purposes. It also re-imposed mandatory sentencing minimums depending on which drug and how  much was involved.  1988 -- Anti-Drug Abuse Act of 1988 Established the Office of Materials Engineer (ONDCP) in the [employer] of the Economist; authorized funds for [employer], state and local drug enforcement activities, school-based drug prevention efforts, and drug abuse treatment with special emphasis on injecting drug abusers at high risk for AIDS.  2000 -- Federal - The Drug Addiction Treatment Act of 2000 (DATA 2000) It enables qualified physicians to  prescribe and/or dispense narcotics for the purpose of treating opioid dependency. For the first time, physicians are able to treat this disease from their private offices or other clinical settings. This presents a very desirable treatment option for those who are unwilling or unable to seek help in drug treatment clinics. Patients can now be treated in the privacy of their doctors office, as are other people being treated for any other type of medical condition. One medicine doctors may now prescribe is Buprenorphine. The major downfall of this Act is the limitation of 30 patients per practice - which means that large facilities, no matter how many physicians are there, can only treat 30 patients at a time.  2002-- DEA reschedules buprenorphine from a schedule V drug to a schedule III drug, on June 25, 2001 - the day before the FDA approval of Suboxone and Subutex despite overwhelming objection by the medical community.  2004: June 2004 THE CONFIDENTIALITY OF ALCOHOL AND DRUG ABUSE PATIENT RECORDS REGULATION AND THE HIPAA PRIVACY RULE:  Confidentiality of Alcohol and Drug Dependence Patient Records (summary) Code of Federal Regulations Title 42 Part 2 (42 CFR Part 2)  The confidentiality of alcohol and drug dependence patient records maintained by this practice/program is protected by federal law and regulations. Generally, the practice/program may not say to a person outside the practice/program that a patient attends the practice/program, or disclose any information identifying a patient as being alcohol or drug dependent unless:  The patient consents in writing; The disclosure is allowed by a court order, or The disclosure is made to medical personnel in a medical emergency or to qualified personnel for research,  audit, or practice/program evaluation. Violation of the federal law and regulations by a practice/program is a crime. Suspected violations may be reported to appropriate authorities  in accordance with federal regulations. Freight forwarder and regulations do not protect any information about a crime committed by a patient either at the practice/program or against any person who works for the practice/program or about any threat to commit such a crime. Federal laws and regulations do not protect any information about suspected child abuse or neglect from being reported under state law to appropriate state or local authorities.  sample consent form (MS-WORD)  2005: 04-20-2004 Public law [phone], Amends the Controlled Substances Act to eliminate the 30-patient limit for medical group practices allowed to dispense narcotic drugs in schedules III, IV, or V for maintenance or detoxification treatment (retains the 30-patient limit for an individual physician). This amendment removes the 30-patient limit on group medical practices that treat opioid dependence with buprenorphine. The restriction was part of the original Drug Addiction Treatment Act of 2000 (DATA) that allowed treatment of opioid dependence in a doctor's office. With this change, every certified doctor may now prescribe buprenorphine up to his or her individual physician limit of 30 patients.  2006: On 09/16/2005 President Levy signed Bill H.R.6344 into law. This allows physicians who have been certified to prescribe certain drugs for the treatment of opioid dependence under DATA2000 to treat up to  100 patients (up from 30) by submitting an intent notification to the Dept of Health and [employer]. This is a major step forward in both fighting the stigma and allowing access to treatment previously not available to some. For more details see 30/100-PATIENT LIMIT  2016: HHS augments regulations concerning the 30/100 patient limit by raising the limit to 275 for qualifying physicians. Link to summary of regulation  2016: Comprehensive Addiction and Recovery Act of 2016 (sec.303) amends the Controlled Substance Act - to allow Nurse  Practitioners and Physician Assistants to become eligible to prescribe buprenorphine for the treatment of opioid use disorder. See the entire law for more details.  The roots of the concurrent regulation of certain drugs under two statutory schemes go back to the beginning of this century. In 1906, Congress enacted the Pure Food and Drug Act, establishing one regime of regulation to assure (among other things) that drugs were not adulterated or misbranded. These regulations were amended several times, recodified in 1938, and expanded on again from the 1940s through the 1990s. Their implementation and enforcement is today assigned to the Food and Drug Administration (FDA) in the Department of Health and Human Services [facility]).  In 1914, Congress adopted the [place] Narcotic Act to stop abuse of addictive drugs. The [place] Narcotic Act was amended in 1937 to include marijuana. In 1965, amphetamines, barbiturates, and hallucinogens came under regulation, but under the [employer], Drug, and Cosmetic Act. In 1970, these various statutes were consolidated and recodified as the Controlled Substances Act (CSA), which has been amended several times since then. Its implementation and enforcement is today assigned to the Drug Enforcement Administration (DEA) in the Department of Justice.  The first clash occurred after World War I, when so-called morphine  clinics existed and physicians prescribed or dispensed morphine  to addicts. Some addicts were veterans of the American Civil War, the Spanish-American War, and WWI, who had become addicted during treatment for war wounds, but most of them came from the growing population of nonmedical addicts ([place], 8017). The Narcotics Division of the Prohibition Unit of the Department of the Treasury, which was then responsible for enforcing the [facility] Narcotic Act, concluded that this activity was not the legitimate practice of medicine but simple drug trafficking. The  Treasury Department swiftly closed the clinics and made it personally and professionally risky for physicians to maintain a narcotic addict for any reason. In did so, however, only after the American Medical Association had adopted a resolution, in 1920, opposing ambulatory clinics''.  In 1972, the public health establishment, including the Secretary of Health, Education, and Welfare, the Education Officer, Environmental, the [employer] of [employer], and the Chemical Engineer for Drug Abuse Prevention, was unprepared to allow [employer] of Narcotics and Dangerous Drugs, DEA's predecessor agency, to unilaterally define the parameters of medical practice for the use of methadone in the treatment of heroin addiction. As a consequence, a new set of rules--the third, on top of the FDA and DEA schemes--was added, one that inserted FDA deeply into the practice of medicine, notwithstanding its protestations to the contrary. Congress ratified this joint responsibility of law enforcement and public health officials for methadone through this third set of rules in 1974 with the passage of the Narcotic Addict Treatment Act (NATA). To examine in detail the evolution of this third set of rules--commonly referred to as the FDA or DHHS methadone regulations--we turn, first, to the period of the mid-1960s.  Increased use of heroin in the  post World War II period first became apparent in the early to mid 1950s. During the [employer], a minimum mandatory narcotics law was enacted in 1956, effective July 1957. 1962 [facility] conference on drug abuse, the [employer] on Narcotic and Drug Abuse (the [employer]) of 1963, the Drug Abuse Control Amendments of 1965, the President's Commission on [employer] and Administration of Justice (the [employer]) of [phone], and the Narcotic Addiction Rehabilitation Act of 1966.  The 1965 Drug Abuse Control Amendments  brought under strict federal control all nonnarcotic drugs capable of producing serious psychotoxic effects when abused. This act also created the [employer] of Drug Abuse Control within the Department of Health, Education, and Welfare (DHEW) and shifted the basis for aon corporation of illegal drugs from tax principles (administered by the Department of Treasury) to the regulation of commerce (administered by the [employer]).  The 1966 Narcotic Addiction Rehabilitation Act TOUR MANAGER) authorized the civil commitment of narcotic addicts, and federal assistance to state and local governments to develop a local system of drug treatment programs. With respect to the latter, the [employer] of Mental Health [facility]) initially proposed the gradual implementation of the state assistance effort, mainly through a common mental health mechanism--inpatient treatment programs. However, because of a perceived pressing need, the courts began to commit addicts to these programs even before they were officially opened or staffed. The NARA legislation imposed the following contract requirements on treatment centers: (1) thrice-a-week counseling sessions; (2) weekly urine tests; (3) restorative dental services; (4) psychological consultations and vocational training; and (5) the treatment modalities of drug-free outpatient, therapeutic community, and methadone maintenance. Reorganization Plan No. 1 of 1968 transferred the primary functions of the [employer] of Narcotics (FBN) from the [employer] to the Department of Justice; it also transferred the [employer] of Drug Abuse Control functions to the Department of Justice. Within the [employer], the [employer] of Narcotics and Dangerous Drugs (BNDD) was created, which became the Drug Enforcement Administration in 1973.   Under the first [place] administration [phone]), federal drug abuse policy developed in a significant way. These developments included a 1969 war  on drugs presidential message, resulting legislation in 1970, and a Special Action Office created by executive order in 1971 and authorized in statute in 1972. Brynn, in 1969, to send a message to Congress on drug abuse. Although this was the first time that a U.S. president invoked the war on drugs image, it was in retrospect the most balanced approach to the problem of drug abuse that had been advanced. The 1969 message resulted in the submission of legislation to the Congress and the passage, the following year, of the Comprehensive Drug Abuse Prevention and Control Act of 1970 [employer] [phone], July 15, 1969). The act dealt with research, treatment, and prevention of drug abuse and drug dependence, and with drug abuse charity fundraiser. One major purpose of the 1970 legislation was to reverse some of the strictures of the [employer] of 1914. The 1970 act sought to clarify for the medical profession . . . the extent to which they may safely go in treating narcotic addicts as patients. Title I, in Section IV, charged the Surveyor, Minerals, Education, and Welfare, to determine the appropriate methods of professional practice in the medical treatment of the narcotic addiction of various classes of narcotic addicts. This provision constitutes the initial statutory basis for treatment standards. The law enforcement sections consolidated all prior federal statutes into the  Controlled Substances Act and the Controlled Substances Export and Import Act (Titles II and III, respectively, of the Comprehensive Drug Abuse Prevention and Control Act of 1970). Under this legislation, substances were classified under five schedules according to their abuse potential, and psychological and physical effects. Methadone was placed in Schedule II, along with such opiate drugs as morphine , codeine, and hydrocodone .  One of the most important steps taken by President Brynn was to establish in June 1971 the  Special Action Office for Drug Abuse Prevention (SAODAP) in the [employer] of the President ([place] [phone], March 05, 1970). In mid-1971, [facility] appointed Dr. Maple Dunnings as SAODAP director. Within a year, the Drug Abuse Prevention Office and Treatment Act of 1972 [employer] [phone], December 08, 1970) gave statutory authority to [facility], but limiting setting, on March 18, 1974, as the limit on its existence.  The purpose of the 1972 act was to bring the resources of the federal government to bear on drug abuse with the immediate objective of significantly reducing its incidence and developing a comprehensive, coordinated long-term federal strategy to combat drug abuse.  Narcotic Addict Treatment Act [employer]) of 1974 [employer] [phone]), which amended the Controlled Substances Act. This legislation was driven by concern for the diversion of methadone to illicit channels that was occurring in 1972 and 1973, as reflected in the title of the Senate bill adopted on February 25, 1972, the Methadone Diversion Control Act of 1973. (U.S. Senate, 1970a, 8029a).  The 1980 final rule (45 FR 37305, June 08, 1979) reduced the minimum standard for admission from two years of addiction to one year coupled with a clinical determination that the individual was currently physiologically.  The regulations were next revised in 1989, following two proposals to modify them, one in 1983 and one in 1987.  Under President Tanda Corrente, a government-wide effort was made to review all federal government regulations and to eliminate or reduce the burden of these regulations on the private sector, state and nash-finch company, and wps resources.   The 1983 recommendations, though not adopted, did initiate another revision of the methadone regulations, which first found expression in a 1987 proposed rule (52 FR 37047, June 20, 1986) and culminated in a final rule (54 FR 8954, November 19, 1987) at the end of the  decade. In the 1987 proposed rule, the FDA and NIDA, in an effort to put the best face on the unenthusiastic 1983 response by the provider community to converting the regulations to guidelines, indicated that they had retained the current requirements necessary to achieve the goals of the 1974 NATA, but were proposing to streamline the regulation and to promote more efficient operation of methadone programs. The 1987 proposed rule, issued by the FDA and NIDA, advanced the following changes in the methadone regulation: that detoxification treatment be divided into short-term (<21 days) and long-term (>21 and <180 days) treatment; that the minimum staffing ratio of one counselor to 50 patients be eliminated; that blood tests be allowed as ways to conduct initial drug screening or to meet the monthly testing requirements for six-day take-home patients; that the 72-hour notification of FDA and the pertinent state authority for methadone doses greater than 100 mg be eliminated; that special adverse reaction reporting requirements for methadone be eliminated and reliance placed upon general FDA reporting requirements; that a supervising counselor be allowed to conduct the annual review of the patient's treatment plan for certain qualified patients who had been in treatment for 3 years  or longer; and that the requirement of an annual report of methadone treatment programs to the FDA be dropped. The FDA and NIDA issued a final rule on November 19, 1987, based on comments on the 1987 proposed (54 FR 8954). Concurrently, FDA and NIDA issued a six-page guidance document, which noted that the regulations, over time, had recommended certain practices that were not actually required. Public Health Service, in Congress, and elsewhere, to reorganize the Alcohol, Drug Abuse, and Mental Health Administration (ADAMHA). These efforts culminated in the [employer] of 1992 [employer] [phone], March 29, 1991), the main  purpose of which was to transfer the research portions of the three ADAMHA institutes--NIDA, the General Mills of Alcoholism and Alcohol Abuse, and the General Mills of Mental Health--to the [employer] and to create the Substance Abuse and Museum/gallery Exhibitions Officer [facility]) as the home for the service functions of these entitles.  Guidelines for Opioid Treatment The Federal Guidelines for Opioid Treatment Programs - 2015 serve as a guide to accrediting organizations for developing accreditation standards. The guidelines also provide OTPs with information on how programs can achieve and maintain compliance with federal regulations. The 2015 guidelines are an update to the 2007 Guidelines for the Accreditation of Opioid Treatment Programs (PDF  547 KB). The new document reflects the obligation of OTPs to deliver care consistent with the patient-centered, integrated, and recovery-oriented standards of substance use treatment.  DPT oversees the certification of OTPs and provides guidance to nonprofit organizations and state governmental entities that want to become a SAMHSA-approved accrediting body. Learn more about the accreditation and certification of OTPs and [facility] oversight of OTP accreditation bodies.  Model Guidelines for [employer] With input from [facility], the Federation of [employer] in 2013 adopted a revised version of the federations office-based opioid treatment policies. The Model Policy on DATA 2000 and Treatment of Opioid Addiction in the Medical Office - 2013 (PDF  279 KB) provides model guidelines for use by state medical boards in regulating office-based opioid treatment.  Holiday Guidance for Opioid Treatment Programs (PDF  203 KB) In response to requests for the upcoming federal holidays and ensuing weekends (December 24th, 25th, and 26th and December 31st, Jan 1st, and Jan 2nd), this letter is to provide guidance  regarding requests for unsupervised doses of medication for patients for these dates. View a sample [id number] (PDF  194 KB).  Federal regulation of drugs emerged as early as 53, under a law that addressed only imported drugs. In 1905 the [employer] launched a private, voluntary means of controlling a substantial part of the drug marketplace, a system that remained in place for over a half-century. Drug regulation in FDA has evolved considerably since President Ricardo Para signed the 1906 Pure Food and Drugs Act.  1820 Eleven doctors set up the U.S. Pharmacopeia and record the first list of standard drugs. 1848 Drug Importation Act passed by Congress requires U.S. Customs Service inspection to stop entry of tainted, low quality drugs from overseas. 8116 Dr. Mitchell MICAEL Burrs becomes the chief chemist at the [facility] of [employer] adulteration studies.  1905 The American Medical Association [facility]) begins a voluntary program of drug approval that would last until 1955. In order to advertise in the [facility] and related journals, drug companies must show proof that the drug will treat what they claim. 1906 The original Food and Drug Act is passed by Congress on June 30 and signed by [employer].  The Act outlaws states from buying and selling food, drinks, and drugs that have been mislabeled and tainted. 1911 In U.S. v. Vicci, the [employer] that the [employer] and Drugs Act does not outlaw false medical claims but only false and misleading statements about the ingredients or identity of a drug. 1912 Congress passes the [place] Amendment to overcome the ruling in U.S. v. Vicci. The Act outlaws labeling medicines with fake medical claims that is meant to trick the buyer. 1930 The name of the Food, Drug, and Insecticide Administration is shortened to Food and Drug Administration (FDA) under an therapist, music. 1933 FDA  recommends a total rewrite of the out-of-date 1906 Food and Drugs Act.   1937 Elixir Sulfanilamide, contain the poisonous liquid, diethylene glycol, kills 107 persons, many of whom are children, dramatizing the need to establish drug safety before marketing and to pass the pending food and drug law. 1938 Congress passes [employer], Drug, and Cosmetic (FDC) Act of 1938, which requires that new drugs show safety before selling. This starts a new system of drug regulation. The Act also requires that safe limits be set for unavoidable poisonous matter and allows for factory inspections. The Directv is given power to oversee advertising for all FDAregulated products except prescription drugs. FDA states that sulfanilamide and other dangerous drugs must be given under the direction of a medical expert. This begins the requirement for prescription only (nonnarcotic) drugs (see 1951 Concrete-Humphrey amendment). 1941 Nearly 300 deaths and injuries result from the use of sulfathiazole tablets, an antibiotic, tainted with the sedative, phenobarbital. In response, FDA drastically changes manufacturing and quality controls. These changes lead to the development of good manufacturing practices (GMPs). 1948 The [employer] in U.S. v. Floretta that FDA jurisdiction extends to retail stores, thereby allowing FDA to stop illegal sales of drugs by pharmacies including barbiturates and amphetamines. 1950 In Walgreen. v. U.S., a U.S. Court of Appeals rules that the directions for use on a drug label must include the drugs purpose. 1951 Congress passes the Dundee-Humphrey Amendment, which defines the kinds of drugs that cannot be used safely without medical supervision. The amendment limits sale of these drugs to prescription only by a medical professional. All other drugs are to be available without a prescription. 1952 A nationwide investigation by FDA  reveals that chloramphenicol, an antibiotic, caused nearly 180 cases of often deadly blood diseases. Two years later FDA engages the [employer] of Hospital Pharmacists, the American Association of Medical Record Librarians, and later the American Medical Association in a voluntary program of drug reaction reporting. 1953 The [employer] Amendment clarifies previous law and requires FDA to give manufacturers written reports of conditions seen during inspections and results of factory samples. 1962 Thalidomide, a new sleeping pill, causes severe birth defects of the arms and legs in thousands of babies born in [place]. The U.S. media reports on how Dr. Cathlean Mort, a FDA medical officer, helped prevent approval and marketing of Thalidomide in the [place] . These reports stirred up public support for stronger drug laws. 3 Congress passes the [employer]. For the first time, these laws require drug makers to prove their drug works before FDA can approve them for sale. The Advisory Committee on Investigational Drugs meets for the first time. This was the first meeting of a committee to advise FDA on product approval and policy on an ongoing basis. 1966 FDA contracts with the [facility]  of [employer] to measure the effectiveness of 4,000 marketed drugs approved on the basis of safety alone between [phone] and 1961-09-27. The Fair Packaging and Labeling Act requires all consumer products, in interstate commerce, to be honestly and informatively labeled. 1967/09/28 FDA forms the Drug Efficacy Study Implementation (DESI) to carry out recommendations of the [employer] of the effectiveness of drugs first sold between [place] and 1963/01/09January 09, 1971 FDA requires the first patient package insert, medicines must come with information for the patient about risks and benefits. 1972 Over-the-Counter Drug Review begins  to enhance the safety, effectiveness and appropriate labeling of drugs sold without prescription. 1973 The U.S. Supreme Court upholds the [place] drug effectiveness law and approves FDAs action to control entire classes of products. 1982 FDA issues Tamper-resistant Packaging Regulations to prevent poisonings such as deaths from cyanide placed in Tylenol  capsules. Congress passes the [employer] in 27-Sep-1982, making it a crime to tamper with packaged consumer products. 1983/09/28 Drug Price Advertising Account Planner Act (Hatch-Waxman Act) increases the availability of less costly generic drugs by allowing FDA to approve applications for generic versions of brand-name drugs without repeating the research that proved the safety and effectiveness of the brand-name drugs. The Act also allowed brand-name companies to apply for up to five years additional patent protection for the new medicines they developed to make up for time lost while their products were going through FDA's approval process. 1989 The FDA issued guidelines asking drug makers to decide if a drug is likely to have usefulness in elderly people and to include elderly people in studies when applicable. 1991 In 27-Sep-1980, the FDA and the Department of Health and Human Services published a policy on protecting people in research. In 09-27-90, this policy is adopted by more than a dozen federal agencies involved in human subject research and becomes known as the Common Rule. 4 1993 FDA launches MedWatch, a system designed to collect reports from health professionals on problems with drugs and other medical products. FDA issues guidelines for measuring gender differences in responses to medication. Drug companies are encouraged to include patients of both sexes in their research of drugs and to study any gender-specific effects. 1995 FDA declares cigarettes to be drug delivery devices. Limits are issued on marketing and  sales to reduce smoking by young people. 1998 FDA introduces the Adverse Event Reporting System (AERS), a computerized database designed to store and study safety reports on already marketed drugs.  The Demographic Rule requires that a marketing application review data on safety and effectiveness by age, gender, and race. The Pediatric Rule requires drug makers of selected new and existing drugs to conduct studies on drug safety and effectiveness in children. 1999 Creation of the Drug Facts Label for OTC drug products. The law requires all overthe-counter drug labels to have information in a standard format. These drug facts labels are designed to give the user easy-to-find information. 2000 The U. S. [employer], upholds an earlier decision from [employer] and Drug Administration v. [employer]. et al. and rules 5-4 that FDA does not have authority to regulate tobacco as a drug. September 27, 2001 The Best Pharmaceuticals for Children Act, in exchange for studying the drug in children, the drug maker gets six months of selling their product without competition. 09/27/02 The Pediatric Research Equity Act gives FDA the right to ask drug companies to study the effectiveness of new drugs in children. Sep 28, 2003 FDA advises medical professionals to  limit the use of a pain reliever called Cox-2, a nonsteroidal anti-inflammatory drug (NSAIDs). Studies had shown that long-term use raised chances of heart attacks and strokes. The warning is also added to the over-thecounter NSAIDs Drug Facts label. Medicines used in hospitals must have a bar code to prevent patients from receiving the wrong medicine. 5 2005 The Drug Safety Board is formed, consisting of FDA staff and representatives from the [employer] of [address] and the [employer]. The Board advises the Director, Center for Drug Evaluation and Research, FDA, on drug safety issues and works with the agency in sharing safety  information to health professionals and patients.  The [place]  Food and Drug Administration (FDA) was first created to enforce the Pure Food and Drug Act of 1906. In this capacity, the FDA is charged with protecting the health of the [place]  public, to ensure the quality of its food, medicine, and cosmetics. Before this time, the [place]  government had no formal oversight of these products and left issues of quality and purity to the individual manufactures, or at times, individual states.    Review: [place] Stop ACT. (The Strengthen Opioid Misuse Prevention (STOP) Act of 2017). GENERAL ASSEMBLY OF [place]  SESSION 2017 SESSION LAW 2017-74 HOUSE BILL 243  PMP mandatory The dispenser shall report: (1) The dispenser's DEA number. (2) The name of the patient for whom the controlled substance is being dispensed, and the patient's: a. Full address, including city, state, and zip code, b. Telephone number, and c. Date of birth. (3) The date the prescription was written. (4) The date the prescription was filled. (5) The prescription number. (6) Whether the prescription is new or a refill. (7) Metric quantity of the dispensed drug. (8) Estimated days of supply of dispensed drug, if provided to the dispenser. (9) National Drug Code of dispensed drug. (10) Prescriber's DEA number. (11) Method of payment for the prescription.  No paper prescriptions  Duration of scripts Acute vs Chronic prescribing  2016 CDC Guidelines for prescribing Opioids for Chronic Pain. (Updated in 2022.) Medical Board  Laws:  Prescription Laws Drug laws, rules, and regulations are constantly changing. Any attempt to summarize them would quickly become outdated. Because of that, the Board encourages practitioners who seek guidance on prescribing procedures to refer to the sources listed below in addition to the Boards position statements, rules and Medical Practice Act.  [place]  Board of Pharmacy  (NCBOP) (which offers the states pharmacy laws and rules, and links to the Code of Federal Regulations) [employer] Site: www.ncbop.org  [place]  General Statutes General Web Site: politicalpool.cz See: [place]  Food, Drug, and Cosmetic Act: S293155 & 106-134 See: [place]  Pharmacy Practice Act, Article 4A: [phone] See: [place]  Controlled Substances Act, Article 5: 90-86 & 90-113.8 See: Use of controlled substances to render one mentally incapacitated or physically helpless: [facility]. Code, Title 21, Food & Drugs www.deadiversion.usdoj.gov Controlled Substances Schedules www.deadiversion.usdoj.gov Drug Warehouse Manager - www.deadiversion.usdoj.gov 42 CFR  8.12 - Federal opioid treatment standards.   Effective May 15, 2016, prior approval will be required for opioid analgesic doses for [facility]. Medicaid and [place] [facility]) beneficiaries which:  Exceed 120 mg of morphine  equivalents (MME) per day  Are greater than a 14-day supply of any opioid, or,  Are non-preferred opioid products on the [place] Medicaid Preferred Drug List (PDL)  FEDERAL 42 CFR  8.12 - Federal opioid treatment standards. Title II of the Comprehensive Drug Abuse  Prevention and Control Act of 1970, commonly known as the Controlled Substance Act (CSA) Title 21 [place]  Code ([place]) Controlled Substances Act.   Reference:   ______________________________________________________________________       ______________________________________________________________________    Medication Rules  Purpose: To inform patients, and their family members, of our medication rules and regulations.  Applies to: All patients receiving prescriptions from our practice (written or electronic).  Pharmacy of record: This is the pharmacy where your electronic prescriptions will be sent. Make sure we have the correct one.  Electronic  prescriptions: In compliance with the [place]  Strengthen Opioid Misuse Prevention (STOP) Act of 2017 (Session Law 2017-74/H243), effective September 19, 2018, all controlled substances must be electronically prescribed. Written prescriptions, faxing, or calling prescriptions to a pharmacy will no longer be done.  Prescription refills: These will be provided only during in-person appointments. No medications will be renewed without a face-to-face evaluation with your provider. Applies to all prescriptions.  NOTE: The following applies primarily to controlled substances (Opioid* Pain Medications).   Type of encounter (visit): For patients receiving controlled substances, face-to-face visits are required. (Not an option and not up to the patient.)  Patient's Responsibilities: Pain Pills: Bring all pain pills to every appointment (except for procedure appointments). Pill counts are required.  Pill Bottles: Bring pills in original pharmacy bottle. Bring bottle, even if empty. Always bring the bottle of the most recent fill.  Medication refills: You are responsible for knowing and keeping track of what medications you are taking and when is it that you will need a refill. The day before your appointment: write a list of all prescriptions that need to be refilled. The day of the appointment: give the list to the admitting nurse. Prescriptions will be written only during appointments. No prescriptions will be written on procedure days. If you forget a medication: it will not be Called in, Faxed, or electronically sent. You will need to get another appointment to get these prescribed. No early refills. Do not call asking to have your prescription filled early. Partial  or short prescriptions: Occasionally your pharmacy may not have enough pills to fill your prescription.  NEVER ACCEPT a partial fill or a prescription that is short of the total amount of pills that you were prescribed.  With  controlled substances the law allows 72 hours for the pharmacy to complete the prescription.  If the prescription is not completed within 72 hours, the pharmacist will require a new prescription to be written. This means that you will be short on your medicine and we WILL NOT send another prescription to complete your original prescription.  Instead, request the pharmacy to send a carrier to a nearby branch to get enough medication to provide you with your full prescription. Prescription Accuracy: You are responsible for carefully inspecting your prescriptions before leaving our office. Have the discharge nurse carefully go over each prescription with you, before taking them home. Make sure that your name is accurately spelled, that your address is correct. Check the name and dose of your medication to make sure it is accurate. Check the number of pills, and the written instructions to make sure they are clear and accurate. Make sure that you are given enough medication to last until your next medication refill appointment. Taking Medication: Take medication as prescribed. When it comes to controlled substances, taking less pills or less frequently than prescribed is permitted and encouraged. Never take more pills than instructed. Never take the medication more frequently than  prescribed.  Inform other Doctors: Always inform, all of your healthcare providers, of all the medications you take. Pain Medication from other Providers: You are not allowed to accept any additional pain medication from any other Doctor or Healthcare provider. There are two exceptions to this rule. (see below) In the event that you require additional pain medication, you are responsible for notifying us , as stated below. Cough Medicine: Often these contain an opioid, such as codeine or hydrocodone . Never accept or take cough medicine containing these opioids if you are already taking an opioid* medication. The combination may cause  respiratory failure and death. Medication Agreement: You are responsible for carefully reading and following our Medication Agreement. This must be signed before receiving any prescriptions from our practice. Safely store a copy of your signed Agreement. Violations to the Agreement will result in no further prescriptions. (Additional copies of our Medication Agreement are available upon request.) Laws, Rules, & Regulations: All patients are expected to follow all [address] and [employer], Rules, [place] [employer]. Ignorance of the Laws does not constitute a valid excuse.  Illegal drugs and Controlled Substances: The use of illegal substances (including, but not limited to marijuana and its derivatives) and/or the illegal use of any controlled substances is strictly prohibited. Violation of this rule may result in the immediate and permanent discontinuation of any and all prescriptions being written by our practice. The use of any illegal substances is prohibited. Adopted CDC guidelines & recommendations: Target dosing levels will be at or below 60 MME/day. Use of benzodiazepines** is not recommended. Urine Drug testing: Patients taking controlled substances will be required to provide a urine sample upon request. Do not void before coming to your medication management appointments. Hold emptying your bladder until you are admitted. The admitting nurse will inform you if a sample is required. Our practice reserves the right to call you at any time to provide a sample. Once receiving the call, you have 24 hours to comply with request. Not providing a sample upon request may result in termination of medication therapy.  Exceptions: There are only two exceptions to the rule of not receiving pain medications from other Healthcare Providers. Exception #1 (Emergencies): In the event of an emergency (i.e.: accident requiring emergency care), you are allowed to receive additional pain medication. However, you are  responsible for: As soon as you are able, call our office [phone], at any time of the day or night, and leave a message stating your name, the date and nature of the emergency, and the name and dose of the medication prescribed. In the event that your call is answered by a member of our staff, make sure to document and save the date, time, and the name of the person that took your information.  Exception #2 (Planned Surgery): In the event that you are scheduled by another doctor or dentist to have any type of surgery or procedure, you are allowed (for a period no longer than 30 days), to receive additional pain medication, for the acute post-op pain. However, in this case, you are responsible for picking up a copy of our Post-op Pain Management for Surgeons handout, and giving it to your surgeon or dentist. This document is available at our office, and does not require an appointment to obtain it. Simply go to our office during business hours (Monday-Thursday from 8:00 AM to 4:00 PM) (Friday 8:00 AM to 12:00 Noon) or if you have a scheduled appointment with us , prior to your surgery,  and ask for it by name. In addition, you are responsible for: calling our office [phone], at any time of the day or night, and leaving a message stating your name, name of your surgeon, type of surgery, and date of procedure or surgery. Failure to comply with your responsibilities may result in termination of therapy involving the controlled substances.  Consequences:  Non-compliance with the above rules may result in permanent discontinuation of medication prescription therapy. All patients receiving any type of controlled substance is expected to comply with the above patient responsibilities. Not doing so may result in permanent discontinuation of medication prescription therapy. Medication Agreement Violation. Following the above rules, including your responsibilities will help you in avoiding a Medication  Agreement Violation (Breaking your Pain Medication Contract).  *Opioid medications include: morphine , codeine, oxycodone , oxymorphone, hydrocodone , hydromorphone , meperidine , tramadol , tapentadol, buprenorphine, fentanyl , methadone. **Benzodiazepine medications include: diazepam (Valium), alprazolam (Xanax), clonazepam (Klonopine), lorazepam  (Ativan ), clorazepate (Tranxene), chlordiazepoxide (Librium), estazolam (Prosom), oxazepam (Serax), temazepam (Restoril), triazolam (Halcion) (Last updated: 07/12/2023) ______________________________________________________________________     ______________________________________________________________________    Medication Recommendations and Reminders  Applies to: All patients receiving prescriptions (written and/or electronic).  Medication Rules & Regulations: You are responsible for reading, knowing, and following our Medication Rules document. These exist for your safety and that of others. They are not flexible and neither are we. Dismissing or ignoring them is an act of non-compliance that may result in complete and irreversible termination of such medication therapy. For safety reasons, non-compliance will not be tolerated. As with the U.S. fundamental legal principle of ignorance of the law is no defense, we will accept no excuses for not having read and knowing the content of documents provided to you by our practice.  Pharmacy of record:  Definition: This is the pharmacy where your electronic prescriptions will be sent.  We do not endorse any particular pharmacy. It is up to you and your insurance to decide what pharmacy to use.  We do not restrict you in your choice of pharmacy. However, once we write for your prescriptions, we will NOT be re-sending more prescriptions to fix restricted supply problems created by your pharmacy, or your insurance.  The pharmacy listed in the electronic medical record should be the one where you want  electronic prescriptions to be sent. If you choose to change pharmacy, simply notify our nursing staff. Changes will be made only during your regular appointments and not over the phone.  Recommendations: Keep all of your pain medications in a safe place, under lock and key, even if you live alone. We will NOT replace lost, stolen, or damaged medication. We do not accept Police Reports as proof of medications having been stolen. After you fill your prescription, take 1 week's worth of pills and put them away in a safe place. You should keep a separate, properly labeled bottle for this purpose. The remainder should be kept in the original bottle. Use this as your primary supply, until it runs out. Once it's gone, then you know that you have 1 week's worth of medicine, and it is time to come in for a prescription refill. If you do this correctly, it is unlikely that you will ever run out of medicine. To make sure that the above recommendation works, it is very important that you make sure your medication refill appointments are scheduled at least 1 week before you run out of medicine. To do this in an effective manner, make sure that you do not leave the office  without scheduling your next medication management appointment. Always ask the nursing staff to show you in your prescription , when your medication will be running out. Then arrange for the receptionist to get you a return appointment, at least 7 days before you run out of medicine. Do not wait until you have 1 or 2 pills left, to come in. This is very poor planning and does not take into consideration that we may need to cancel appointments due to bad weather, sickness, or emergencies affecting our staff. DO NOT ACCEPT A Partial Fill: If for any reason your pharmacy does not have enough pills/tablets to completely fill or refill your prescription, do not allow for a partial fill. The law allows the pharmacy to complete that prescription within 72  hours, without requiring a new prescription. If they do not fill the rest of your prescription within those 72 hours, you will need a separate prescription to fill the remaining amount, which we will NOT provide. If the reason for the partial fill is your insurance, you will need to talk to the pharmacist about payment alternatives for the remaining tablets, but again, DO NOT ACCEPT A PARTIAL FILL, unless you can trust your pharmacist to obtain the remainder of the pills within 72 hours.  Prescription refills and/or changes in medication(s):  Prescription refills, and/or changes in dose or medication, will be conducted only during scheduled medication management appointments. (Applies to both, written and electronic prescriptions.) No refills on procedure days. No medication will be changed or started on procedure days. No changes, adjustments, and/or refills will be conducted on a procedure day. Doing so will interfere with the diagnostic portion of the procedure. No phone refills. No medications will be called into the pharmacy. No Fax refills. No weekend refills. No Holliday refills. No after hours refills.  Remember:  Business hours are:  Monday to Thursday 8:00 AM to 4:00 PM Provider's Schedule: Eric Como, MD - Appointments are:  Medication management: Monday and Wednesday 8:00 AM to 4:00 PM Procedure day: Tuesday and Thursday 7:30 AM to 4:00 PM Wallie Sherry, MD - Appointments are:  Medication management: Tuesday and Thursday 8:00 AM to 4:00 PM Procedure day: Monday and Wednesday 7:30 AM to 4:00 PM (Last update: 07/12/2022) ______________________________________________________________________     ______________________________________________________________________    National Pain Medication Shortage  The [place] is experiencing worsening drug shortages. These have had a negative widespread effect on patient care and treatment. Not expected to improve any time soon.  Predicted to last past 2029.   Drug shortage list (generic names) Oxycodone  IR Oxycodone /APAP Oxymorphone IR Hydromorphone  Hydrocodone /APAP Morphine   Where is the problem?  Manufacturing and supply level.  Will this shortage affect you?  Only if you take any of the above pain medications.  How? You may be unable to fill your prescription.  Your pharmacist may offer a partial fill of your prescription. (Warning: Do not accept partial fills.) Prescriptions partially filled cannot be transferred to another pharmacy. Read our Medication Rules and Regulation. Depending on how much medicine you are dependent on, you may experience withdrawals when unable to get the medication.  Recommendations: Consider ending your dependence on opioid pain medications. Ask your pain specialist to assist you with the process. Consider switching to a medication currently not in shortage, such as Buprenorphine. Talk to your pain specialist about this option. Consider decreasing your pain medication requirements by managing tolerance thru Drug Holidays. This may help minimize withdrawals, should you run out of medicine. Control your pain  thru the use of non-pharmacological interventional therapies.   Your prescriber: Prescribers cannot be blamed for shortages. Medication manufacturing and supply issues cannot be fixed by the prescriber.   NOTE: The prescriber is not responsible for supplying the medication, or solving supply issues. Work with your pharmacist to solve it. The patient is responsible for the decision to take or continue taking the medication and for identifying and securing a legal supply source. By law, supplying the medication is the job and responsibility of the pharmacy. The prescriber is responsible for the evaluation, monitoring, and prescribing of these medications.   Prescribers will NOT: Re-issue prescriptions that have been partially filled. Re-issue prescriptions already sent to  a pharmacy.  Re-send prescriptions to a different pharmacy because yours did not have your medication. Ask pharmacist to order more medicine or transfer the prescription to another pharmacy. (Read below.)  New 2023 regulation: May 20, 2022 Revised Regulation Allows DEA-Registered Pharmacies to Transfer Electronic Prescriptions at a Patients Request DEA Headquarters Division - Public Information Office Patients now have the ability to request their electronic prescription be transferred to another pharmacy without having to go back to their practitioner to initiate the request. This revised regulation went into effect on Monday, May 16, 2022.     At a patients request, a DEA-registered retail pharmacy can now transfer an electronic prescription for a controlled substance (schedules II-V) to another DEA-registered retail pharmacy. Prior to this change, patients would have to go through their practitioner to cancel their prescription and have it re-issued to a different pharmacy. The process was taxing and time consuming for both patients and practitioners.    The Drug Enforcement Administration [facility]) published its intent to revise the process for transferring electronic prescriptions on August 07, 2020.  The final rule was published in the federal register on April 14, 2022 and went into effect 30 days later.  Under the final rule, a prescription can only be transferred once between pharmacies, and only if allowed under existing state or other applicable law. The prescription must remain in its electronic form; may not be altered in any way; and the transfer must be communicated directly between two licensed pharmacists. Its important to note, any authorized refills transfer with the original prescription, which means the entire prescription will be filled at the same pharmacy.   Reference: hugehand.is [employer] website announcement)  Cheapwipes.at.pdf [employer])   [employer] / Vol. 88, No. 143 / Thursday, April 14, 2022 / Rules and Regulations DEPARTMENT OF JUSTICE  Drug Enforcement Administration  21 CFR Part 1306  [Docket No. [id number]]  RIN [id number] Transfer of Electronic Prescriptions for Schedules II-V Controlled Substances Between Pharmacies for Initial Filling  ______________________________________________________________________       ______________________________________________________________________    Transfer of Pain Medication between Pharmacies  Re: 2023 DEA Clarification on existing regulation  Published on DEA Website: May 20, 2022  Title: Revised Regulation Allows DEA-Registered Pharmacies to Electrical Engineer Prescriptions at a Patients Request DEA Headquarters Division - [employer]  Patients now have the ability to request their electronic prescription be transferred to another pharmacy without having to go back to their practitioner to initiate the request. This revised regulation went into effect on Monday, May 16, 2022.     At a patients request, a DEA-registered retail pharmacy can now transfer an electronic prescription for a controlled substance (schedules II-V) to another DEA-registered retail pharmacy. Prior to this change, patients would have to go through their practitioner  to cancel their prescription and have it re-issued to a different pharmacy. The process was taxing and time consuming for both patients and practitioners.    The Drug Enforcement Administration [facility]) published its intent to revise the process for transferring electronic prescriptions on August 07, 2020.  The final rule was published in the  federal register on April 14, 2022 and went into effect 30 days later.  Under the final rule, a prescription can only be transferred once between pharmacies, and only if allowed under existing state or other applicable law. The prescription must remain in its electronic form; may not be altered in any way; and the transfer must be communicated directly between two licensed pharmacists. Its important to note, any authorized refills transfer with the original prescription, which means the entire prescription will be filled at the same pharmacy.    REFERENCES: 1. DEA website announcement hugehand.is  2. Department of Justice website  Cheapwipes.at.pdf  3. DEPARTMENT OF JUSTICE Drug Enforcement Administration 21 CFR Part 1306 [Docket No. [id number]] RIN [id number] Transfer of Electronic Prescriptions for Schedules II-V Controlled Substances Between Pharmacies for Initial Filling  ______________________________________________________________________       ______________________________________________________________________    Medication Transfer   Notification You are currently compliant and stable on your pain medication regimen. This regimen will be transferred today to your Primary Care Provider (PCP). You will be provided with enough prescriptions to last for 90 days. After that, your prescriptions will need to be taken over by your PCP.  Recommendation Immediately contact your primary care provider to secure an appointment for evaluation before this period is over. Do not wait until the last month to contact them.   Clarification The transfer of your medication regimen does not mean that you are being discharged from our clinic. We will remain available to you for any consultation or interventional therapies you may need.    Alternative Should you decide not to continue taking these medication and would like assistance in permanently stopping them, please let us  know so that we can design a slow tapering down of your regimen.  Reason Our primary responsibility to provide specialized interventional pain management therapies otherwise not available to the community. We have in the past assisted primary care providers with reviewing and adjusting pain medication management therapies, however, we have been transparent to all patients and referring providers that it is not our intention to permanently take over this type of therapy. Transfer of this portion of your care will assist us  in freeing time to assist others in need of our specialty services.   ______________________________________________________________________      ______________________________________________________________________    WARNING: CBD (cannabidiol) & Delta (Delta-8 tetrahydrocannabinol) products.   Applicable to:  All individuals currently taking or considering taking CBD (cannabidiol) and, more important, all patients taking opioid analgesic controlled substances (pain medication). (Example: oxycodone ; oxymorphone; hydrocodone ; hydromorphone ; morphine ; methadone; tramadol ; tapentadol; fentanyl ; buprenorphine; butorphanol; dextromethorphan ; meperidine ; codeine; etc.)  Introduction:  Recently there has been a drive towards the use of natural products for the treatment of different conditions, including pain anxiety and sleep disorders. Marijuana and hemp are two varieties of the cannabis genus plants. Marijuana and its derivatives are illegal, while hemp and its derivatives are not. Cannabidiol (CBD) and tetrahydrocannabinol (THC), are two natural compounds found in plants of the Cannabis genus. They can both be extracted from hemp or marijuana. Both compounds interact with your bodys endocannabinoid system in very different ways. CBD is  associated with pain relief (analgesia) while THC  is associated with the psychoactive effects (the high) obtained from the use of marijuana products. There are two main types of THC: Delta-9, which comes from the marijuana plant and it is illegal, and Delta-8, which comes from the hemp plant, and it is legal. (Both, Delta-9-THC and Delta-8-THC are psychoactive and give you the high.)   Legality:  Marijuana and its derivatives: illegal Hemp and its derivatives: Legal (State dependent) UPDATE: (11/05/2021) The Drug Enforcement Agency (DEA) issued a letter stating that delta cannabinoids, including Delta-8-THCO and Delta-9-THCO, synthetically derived from hemp do not qualify as hemp and will be viewed as Schedule I drugs. (Schedule I drugs, substances, or chemicals are defined as drugs with no currently accepted medical use and a high potential for abuse. Some examples of Schedule I drugs are: heroin, lysergic acid diethylamide (LSD), marijuana (cannabis), 3,4-methylenedioxymethamphetamine (ecstasy), methaqualone, and peyote.) (cuetune.com.ee)  Legal status of CBD in [place]:  Conditionally Legal  Reference: FDA Regulation of Cannabis and Cannabis-Derived Products, Including Cannabidiol (CBD) - oemdeals.dk  Warning:  CBD is not FDA approved and has not undergo the same manufacturing controls as prescription drugs.  This means that the purity and safety of available CBD may be questionable. Most of the time, despite manufacturer's claims, it is contaminated with THC (delta-9-tetrahydrocannabinol - the chemical in marijuana responsible for the HIGH).  When this is the case, the [facility] contaminant will trigger a positive urine drug screen (UDS) test for Marijuana (carboxy-THC).   The FDA recently put out a warning about 5 things that everyone should be aware of regarding Delta-8  THC: Delta-8 THC products have not been evaluated or approved by the FDA for safe use and may be marketed in ways that put the public health at risk. The FDA has received adverse event reports involving delta-8 THC-containing products. Delta-8 THC has psychoactive and intoxicating effects. Delta-8 THC manufacturing often involve use of potentially harmful chemicals to create the concentrations of delta-8 THC claimed in the marketplace. The final delta-8 THC product may have potentially harmful by-products (contaminants) due to the chemicals used in the process. Manufacturing of delta-8 THC products may occur in uncontrolled or unsanitary settings, which may lead to the presence of unsafe contaminants or other potentially harmful substances. Delta-8 THC products should be kept out of the reach of children and pets.  NOTE: Because a positive UDS for any illicit substance is a violation of our medication agreement, your opioid analgesics (pain medicine) may be permanently discontinued.  MORE ABOUT CBD  General Information: CBD was discovered in 96 and it is a derivative of the cannabis sativa genus plants (Marijuana and Hemp). It is one of the 113 identified substances found in Marijuana. It accounts for up to 40% of the plant's extract. As of 2018, preliminary clinical studies on CBD included research for the treatment of anxiety, movement disorders, and pain. CBD is available and consumed in multiple forms, including inhalation of smoke or vapor, as an aerosol spray, and by mouth. It may be supplied as an oil containing CBD, capsules, dried cannabis, or as a liquid solution. CBD is thought not to be as psychoactive as THC (delta-9-tetrahydrocannabinol - the chemical in marijuana responsible for the HIGH). Studies suggest that CBD may interact with different biological target receptors in the body, including cannabinoid and other neurotransmitter receptors. As of 2018 the mechanism of action for its  biological effects has not been determined.  Side-effects  Adverse reactions: Dry mouth, diarrhea, decreased appetite, fatigue, drowsiness, malaise, weakness, sleep disturbances, and  others.  Drug interactions:  CBD may interact with medications such as blood-thinners. CBD causes drowsiness on its own and it will increase drowsiness caused by other medications, including antihistamines (such as Benadryl), benzodiazepines (Xanax, Ativan , Valium), antipsychotics, antidepressants, opioids, alcohol and supplements such as kava, melatonin and St. John's Wort.  Other drug interactions: Brivaracetam (Briviact); Caffeine; Carbamazepine (Tegretol); Citalopram  (Celexa ); Clobazam (Onfi); Eslicarbazepine (Aptiom); Everolimus (Zostress); Lithium; Methadone (Dolophine); Rufinamide (Banzel); Sedative medications (CNS depressants); Sirolimus (Rapamune); Stiripentol (Diacomit); Tacrolimus (Prograf); Tamoxifen ; Soltamox); Topiramate (Topamax); Valproate; Warfarin (Coumadin); Zonisamide. (Last update: 08/29/2022) ______________________________________________________________________     ______________________________________________________________________    Appointment Information  It is our goal and responsibility to provide the medical community with assistance in the evaluation and management of patients with chronic pain. Unfortunately our resources are limited. Because we do not have an unlimited amount of time, or available appointments, we are required to closely monitor for unkept or cancelled appointments.  Patient's responsibilities: 1. Punctuality: Patients are required to be physically present in our office at least 15 minutes before their scheduled appointment. 2. Tardiness: Patients not physically present in our office at their scheduled appointment time will be rescheduled. 3. Plan ahead: Assume that you will encounter traffic and plan to arrive 30 minutes before your appointment. 4. Other  appointments and responsibilities: Do not schedule other appointments immediately before or after your scheduled appointment.  5. Be prepared: Make a list of everything that you need to discuss with your provider so that you use your time efficiently. Once the provider leaves your room, he/she will not return to your room to discuss anything that you neglected to bring up during your allowed time. 6. No children or pets: Do not bring children or pets to your appointment. 7. Cancelling or rescheduling your appointment: Advanced notification (more than 24 hours in advance) is required. 8. No Show: Not calling to cancel an appointment and simply not showing up is unacceptable. This leads to loss of appointments that could have been used by a patient in need. (See below)  Corrective process for repeat offenders:  No Shows: Three (3) No Shows within a 12 month period will result in an automatic discharge from our practice. Rescheduling or cancelling with more than 24 hours notice will not be penalized and will not count against you. Tardiness: If you have to be rescheduled three (3) times due to late arrivals, it will be counted as one (1) No Show. Cancellation or reschedule: Three (3) cancellations or rescheduling where notice was given with less than 24 hours in advance, will be recorded as one (1) No Show.  Types of appointments: New patient initial evaluation: These are evaluations only. Your initial patient questionnaire will be collected and entered into the system. A history of present illness will be taken. Prior lab work, imaging studies, and associated treatments will be reviewed. The provider may order appropriate diagnostic testing depending on their evaluation and review of available information. No treatments will be started on this visit. 2nd Follow-up visit: During this visit your provider will inform you of the results of the diagnostic tests ordered on the initial evaluation.  Based on the providers assessment, treatment options will be offered, at which the patient will decide if he/she is interested in the alternatives. If interested, a treatment plan will be established and started. Procedure visits: Post-procedure evaluation visits: Evaluation visits MM New problems Flare-up evaluations Follow-up after diagnostic testing ______________________________________________________________________

## 2024-09-02 NOTE — Progress Notes (Signed)
 PROVIDER NOTE: Interpretation of information contained herein should be left to medically-trained personnel. Specific patient instructions are provided elsewhere under Patient Instructions section of medical record. This document was created in part using AI and STT-dictation technology, any transcriptional errors that may result from this process are unintentional.  Patient: Wendy Snyder  Service: E/M   PCP: Justus Leita DEL, MD  DOB: [birth year]  DOS: 09/02/2024  Provider: Emmy MARLA Blanch, NP  MRN: [id number]  Delivery: Face-to-face  Specialty: Interventional Pain Management  Type: Established Patient  Setting: Ambulatory outpatient facility  Specialty designation: 09  Referring Prov.: Justus Leita DEL, MD  Location: Outpatient office facility       History of present illness (HPI) Wendy Snyder, a 58 y.o. year old female, is here today because of her low back pain, right shoulder pain. Ms. Brophy primary complain today is Back Pain (lower) and Shoulder Pain (right)  Pertinent problems: Wendy Snyder has Anxiety disorder due to known physiological condition; Major depression in partial remission; Chronic pain syndrome; Long term current use of opiate analgesic; Opiate use (60 MME/Day); Spinal cord stimulator in situ (Rechargable Medtronic Neurostimulator) (Cervical Epidural Leads); Chronic upper extremity pain (Right); RSD (reflex sympathetic dystrophy) (right upper extremity); Chronic low back pain (1ry area of Pain) (Bilateral) (R>L) w/o sciatica; Chronic hip pain (2ry area of Pain) (Right); Chronic wrist pain (3ry area of Pain) (Right) (CRPS); Chronic shoulder pain (Right); Pharmacologic therapy; Disorder of skeletal system; Chronic use of opiate for therapeutic purpose; CRPS (complex regional pain syndrome), type I, upper (Right); Chronic knee pain (Left); Osteoarthritis of knee (Left); Rotator cuff tendinitis (Left); Chronic shoulder pain (Bilateral); Osteoarthritis of shoulders  (Bilateral); Osteoarthritis of glenohumeral joints (Bilateral); Osteoarthritis of acromioclavicular joints (Bilateral); and Chronic shoulder pain (Left) on their pertinent problem list.  Pain Assessment: Severity of Chronic pain is reported as a 4 /10. Location: Shoulder Right/ . Onset: More than a month ago. Quality: Aching. Timing: Intermittent. Modifying factor(s): positioning. Vitals:  height is 5' 7 (1.702 m) and weight is 150 lb (68 kg). Her temporal temperature is 97.2 F (36.2 C) (abnormal). Her blood pressure is 141/92 (abnormal) and her pulse is 65. Her respiration is 16 and oxygen saturation is 100%.  BMI: Estimated body mass index is 23.49 kg/m as calculated from the following:   Height as of this encounter: 5' 7 (1.702 m).   Weight as of this encounter: 150 lb (68 kg).  Last encounter: 05/21/2024. Last procedure: 05/21/2024.  Reason for encounter: medication management.  The patient indicates doing well with current medication regimen.  No side effects or adverse reaction reported to medication.  Discussed the use of AI scribe software for clinical note transcription with the patient, who gave verbal consent to proceed.  History of Present Illness   Wendy Snyder is a 58 year old female who presents with right shoulder pain. She is a patient of Dr. Tanya and is seeking management of her right shoulder pain.  She experiences right shoulder pain that is more severe than her back pain. An injection previously provided significant relief for about two weeks, but the pain returned, especially when lifting heavy boxes above her head at work, which she identifies as an aggravating factor.  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 She fears losing her job if she cannot perform these duties.  She is currently taking hydrocodone  10 mg-325  mg every four hours,  which she finds effective in managing her pain. She takes the medication in the morning and as needed throughout the day. No side effects or adverse reactions from the medication, including constipation, are reported. She reports trying to eat a lot of fruits and vegetables and to eat healthy.     Pharmacotherapy Assessment   Hydrocodone -acetaminophen  10-325 mg every four hours as needed for severe pain. MME=60  Monitoring: [place] PMP: PDMP reviewed during this encounter.       Pharmacotherapy: No side-effects or adverse reactions reported. Compliance: No problems identified. Effectiveness: Clinically acceptable.  Wendy Reda CROME, RN  09/02/2024  9:48 AM  Sign when Signing Visit Nursing Pain Medication Assessment:  Safety precautions to be maintained throughout the outpatient stay will include: orient to surroundings, keep bed in low position, maintain call bell within reach at all times, provide assistance with transfer out of bed and ambulation.  Medication Inspection Compliance: Pill count conducted under aseptic conditions, in front of the patient. Neither the pills nor the bottle was removed from the patient's sight at any time. Once count was completed pills were immediately returned to the patient in their original bottle.  Medication: Hydrocodone /APAP Pill/Patch Count: 29 of 180 pills/patches remain Pill/Patch Appearance: Markings consistent with prescribed medication Bottle Appearance: Standard pharmacy container. Clearly labeled. Filled Date: 8 / 76 / 2025 Last Medication intake:  TodaySafety precautions to be maintained throughout the outpatient stay will include: orient to surroundings, keep bed in low position, maintain call bell within reach at all times, provide assistance with transfer out of bed and ambulation.     UDS:  Summary  Date Value Ref Range Status  02/19/2024 FINAL  Final    Comment:     ==================================================================== [employer] 13 (MW) ==================================================================== Test                             Result       Flag       Units  Drug Present and Declared for Prescription Verification   Hydrocodone                     2028         EXPECTED   ng/mg creat   Hydromorphone                  351          EXPECTED   ng/mg creat   Dihydrocodeine                 153          EXPECTED   ng/mg creat   Norhydrocodone                 >1302        EXPECTED   ng/mg creat    Sources of hydrocodone  include scheduled prescription medications.    Hydromorphone, dihydrocodeine and norhydrocodone are expected    metabolites of hydrocodone . Hydromorphone and dihydrocodeine are    also available as scheduled prescription medications.  Drug Absent but Declared for Prescription Verification   Alprazolam                      Not Detected UNEXPECTED ng/mg creat ==================================================================== Test                      Result    Flag  Units      Ref Range   Creatinine              384              mg/dL      >=79 ==================================================================== Declared Medications:  The flagging and interpretation on this report are based on the  following declared medications.  Unexpected results may arise from  inaccuracies in the declared medications.   **Note: The testing scope of this panel includes these medications:   Alprazolam  (Xanax )  Hydrocodone  (Norco)   **Note: The testing scope of this panel does not include the  following reported medications:   Acetaminophen  (Norco)  Acyclovir  (Zovirax )  Levothyroxine  (Synthroid )  Multivitamin  Naloxone  (Narcan )  Valacyclovir  (Valtrex )  Venlafaxine  (Effexor )  Vitamin D3 ==================================================================== For clinical consultation, please call [phone]. ====================================================================     No results found for: CBDTHCR No results found for: D8THCCBX No results found for: D9THCCBX  ROS  Constitutional: Denies any fever or chills Gastrointestinal: No reported hemesis, hematochezia, vomiting, or acute GI distress Musculoskeletal: Low back pain, right shoulder pain Neurological: No reported episodes of acute onset apraxia, aphasia, dysarthria, agnosia, amnesia, paralysis, loss of coordination, or loss of consciousness  Medication Review  ALPRAZolam , HYDROcodone -acetaminophen , Vitamin D3, acyclovir  ointment, levothyroxine , multivitamin, naloxone , triamcinolone  cream, valACYclovir , and venlafaxine  XR  History Review  Allergy: Ms. Morrissette is allergic to phenergan [promethazine hcl]. Drug: Ms. Stoneberg  reports no history of drug use. Alcohol:  reports no history of alcohol use. Tobacco:  reports that she has never smoked. She has never used smokeless tobacco. Social: Ms. Sallis  reports that she has never smoked. She has never used smokeless tobacco. She reports that she does not drink alcohol and does not use drugs. Medical:  has a past medical history of Depression, Hypothyroidism, and Thyroid disease. Surgical: Ms. Brucker  has a past surgical history that includes Spinal cord stimulator implant (2003); Tubal ligation (1990); Fracture surgery (Right); and Breast cyst excision (Left). Family: family history includes Breast cancer in her maternal grandmother; Breast cancer (age of onset: 74) in her mother; Depression in her mother. She was adopted.  Laboratory Chemistry Profile   Renal Lab Results  Component Value Date   BUN 16 01/16/2024   CREATININE 0.93 01/16/2024   BCR 17 01/16/2024   GFRAA 88 12/10/2019   GFRNONAA 76 12/10/2019    Hepatic Lab Results  Component Value Date   AST 22 01/16/2024   ALT 16 01/16/2024   ALBUMIN 4.8 01/16/2024   ALKPHOS 61 01/16/2024     Electrolytes Lab Results  Component Value Date   NA 140 01/16/2024   K 5.1 01/16/2024   CL 105 01/16/2024   CALCIUM 10.0 01/16/2024   MG 2.1 12/10/2019    Bone Lab Results  Component Value Date   25OHVITD1 84 12/10/2019   25OHVITD2 <1.0 12/10/2019   25OHVITD3 84 12/10/2019    Inflammation (CRP: Acute Phase) (ESR: Chronic Phase) Lab Results  Component Value Date   CRP <1 12/10/2019   ESRSEDRATE 2 12/10/2019         Note: Above Lab results reviewed.  Recent Imaging Review  DG PAIN [facility] C-ARM 1-60 MIN NO REPORT Fluoro was used, but no Radiologist interpretation will be provided.  Please refer to NOTES tab for provider progress note. Note: Reviewed        Physical Exam  Vitals: BP (!) 141/92 (Cuff Size: Normal)   Pulse 65   Temp (!) 97.2 F (  36.2 C) (Temporal)   Resp 16   Ht 5' 7 (1.702 m)   Wt 150 lb (68 kg)   LMP 10/13/2016   SpO2 100%   BMI 23.49 kg/m  BMI: Estimated body mass index is 23.49 kg/m as calculated from the following:   Height as of this encounter: 5' 7 (1.702 m).   Weight as of this encounter: 150 lb (68 kg). Ideal: Ideal body weight: 61.6 kg (135 lb 12.9 oz) Adjusted ideal body weight: 64.2 kg (141 lb 7.7 oz) General appearance: Well nourished, well developed, and well hydrated. In no apparent acute distress Mental status: Alert, oriented x 3 (person, place, & time)       Respiratory: No evidence of acute respiratory distress Eyes: PERLA  Musculoskeletal: +LBP Right shoulder pain Assessment   Diagnosis Status  1. Chronic shoulder pain (Right)   2. Chronic low back pain (1ry area of Pain) (Bilateral) (R>L) w/o sciatica   3. Pharmacologic therapy   4. Chronic hip pain (2ry area of Pain) (Right)   5. Chronic wrist pain (3ry area of Pain) (Right) (CRPS)   6. Complex regional pain syndrome type 1 of right upper extremity   7. Chronic pain syndrome   8. Chronic use of opiate for therapeutic purpose   9. Encounter for medication  management   10. Encounter for chronic pain management    Controlled Controlled Controlled   Updated Problems: Problem  Encounter for Medication Management    Plan of Care  Problem-specific:  Assessment and Plan    Chronic pain syndrome Managed with hydrocodone  10-325 mg every four hours. No side effects reported. Dosage appropriate. - Continue hydrocodone  10-325 mg every four hours as needed.  Right shoulder pain Exacerbated by overhead lifting. Previous injection provided two weeks relief. Hydrocodone  effective. Concern about job security due to lifting limitations. - Continue hydrocodone  regimen. - Encouraged dietary modifications to prevent constipation.   Pharmacotherapy: Patient's pain is controlled with hydrocodone , will continue on current medication regimen.  Prescribing drug monitoring (PDMP) reviewed, findings consistent with the use of prescribed medication and no evidence of narcotic misuse or abuse.  Urine drug screening (UDS) up to date.  No side effects or adverse reaction reported to medication.  Schedule follow-up in 90 days for medication management.      Ms. JANCIE KERCHER has a current medication list which includes the following long-term medication(s): levothyroxine , naloxone , venlafaxine  xr, [START ON 09/06/2024] hydrocodone -acetaminophen , [START ON 10/06/2024] hydrocodone -acetaminophen , and [START ON 11/05/2024] hydrocodone -acetaminophen .  Pharmacotherapy (Medications Ordered): Meds ordered this encounter  Medications   HYDROcodone -acetaminophen  (NORCO) 10-325 MG tablet    Sig: Take 1 tablet by mouth every 4 (four) hours as needed for severe pain (pain score 7-10). Must last 30 days    Dispense:  180 tablet    Refill:  0    DO NOT: delete (not duplicate); no partial-fill (will deny script to complete), no refill request (F/U required). DISPENSE: 1 day early if closed on fill date. WARN: No CNS-depressants within 8 hrs of med.    HYDROcodone -acetaminophen  (NORCO) 10-325 MG tablet    Sig: Take 1 tablet by mouth every 4 (four) hours as needed for severe pain (pain score 7-10). Must last 30 days    Dispense:  180 tablet    Refill:  0    DO NOT: delete (not duplicate); no partial-fill (will deny script to complete), no refill request (F/U required). DISPENSE: 1 day early if closed on fill date. WARN: No CNS-depressants within 8  hrs of med.   HYDROcodone -acetaminophen  (NORCO) 10-325 MG tablet    Sig: Take 1 tablet by mouth every 4 (four) hours as needed for severe pain (pain score 7-10). Must last 30 days    Dispense:  180 tablet    Refill:  0    DO NOT: delete (not duplicate); no partial-fill (will deny script to complete), no refill request (F/U required). DISPENSE: 1 day early if closed on fill date. WARN: No CNS-depressants within 8 hrs of med.   Orders:  No orders of the defined types were placed in this encounter.       Return in about 3 months (around 12/01/2024) for (F2F), (MM), Emmy Blanch NP.    Recent Visits Date Type Provider Dept  06/11/24 Procedure visit Tanya Glisson, MD [facility]  Showing recent visits within past 90 days and meeting all other requirements Today's Visits Date Type Provider Dept  09/02/24 Office Visit Baron Parmelee K, NP [facility]  Showing today's visits and meeting all other requirements Future Appointments Date Type Provider Dept  11/26/24 Appointment Jatin Naumann K, NP [facility]  Showing future appointments within next 90 days and meeting all other requirements  I discussed the assessment and treatment plan with the patient. The patient was provided an opportunity to ask questions and all were answered. The patient agreed with the plan and demonstrated an understanding of the instructions.  Patient advised to call back or seek an in-person evaluation if the symptoms or condition worsens.  I personally spent a total of 30 minutes in the  care of the patient today including preparing to see the patient, getting/reviewing separately obtained history, performing a medically appropriate exam/evaluation, counseling and educating, placing orders, referring and communicating with other health care professionals, documenting clinical information in the EHR, independently interpreting results, communicating results, and coordinating care.  Note by: Marquette Blodgett K Emory Leaver, NP (TTS and AI technology used. I apologize for any typographical errors that were not detected and corrected.) Date: 09/02/2024; Time: 10:31 AM

## 2024-09-02 NOTE — Progress Notes (Signed)
 Nursing Pain Medication Assessment:  Safety precautions to be maintained throughout the outpatient stay will include: orient to surroundings, keep bed in low position, maintain call bell within reach at all times, provide assistance with transfer out of bed and ambulation.  Medication Inspection Compliance: Pill count conducted under aseptic conditions, in front of the patient. Neither the pills nor the bottle was removed from the patient's sight at any time. Once count was completed pills were immediately returned to the patient in their original bottle.  Medication: Hydrocodone /APAP Pill/Patch Count: 29 of 180 pills/patches remain Pill/Patch Appearance: Markings consistent with prescribed medication Bottle Appearance: Standard pharmacy container. Clearly labeled. Filled Date: 33 / 80 / 2025 Last Medication intake:  TodaySafety precautions to be maintained throughout the outpatient stay will include: orient to surroundings, keep bed in low position, maintain call bell within reach at all times, provide assistance with transfer out of bed and ambulation.

## 2024-11-26 ENCOUNTER — Encounter: Admitting: Nurse Practitioner

## 2025-04-09 ENCOUNTER — Ambulatory Visit: Admitting: Physician Assistant
